# Patient Record
Sex: Male | Born: 1940 | Race: Black or African American | Hispanic: No | Marital: Single | Smoking: Never smoker
Health system: Southern US, Community
[De-identification: ages and names within clinical notes are randomized; demographics above are authoritative.]

## PROBLEM LIST (undated history)

## (undated) DIAGNOSIS — I10 Essential (primary) hypertension: Secondary | ICD-10-CM

## (undated) DIAGNOSIS — M542 Cervicalgia: Secondary | ICD-10-CM

## (undated) DIAGNOSIS — R0989 Other specified symptoms and signs involving the circulatory and respiratory systems: Secondary | ICD-10-CM

## (undated) DIAGNOSIS — N400 Enlarged prostate without lower urinary tract symptoms: Secondary | ICD-10-CM

## (undated) DIAGNOSIS — J309 Allergic rhinitis, unspecified: Secondary | ICD-10-CM

## (undated) DIAGNOSIS — E7800 Pure hypercholesterolemia, unspecified: Secondary | ICD-10-CM

## (undated) DIAGNOSIS — N189 Chronic kidney disease, unspecified: Secondary | ICD-10-CM

## (undated) HISTORY — DX: Chronic kidney disease, unspecified: N18.9

## (undated) HISTORY — DX: Cervicalgia: M54.2

## (undated) HISTORY — DX: Benign prostatic hyperplasia without lower urinary tract symptoms: N40.0

## (undated) HISTORY — DX: Allergic rhinitis, unspecified: J30.9

## (undated) HISTORY — DX: Other specified symptoms and signs involving the circulatory and respiratory systems: R09.89

---

## 1998-04-26 ENCOUNTER — Emergency Department (HOSPITAL_COMMUNITY): Admission: EM | Admit: 1998-04-26 | Discharge: 1998-04-26 | Payer: Self-pay | Admitting: Emergency Medicine

## 2004-01-31 ENCOUNTER — Ambulatory Visit (HOSPITAL_COMMUNITY): Admission: RE | Admit: 2004-01-31 | Discharge: 2004-01-31 | Payer: Self-pay | Admitting: Internal Medicine

## 2006-07-15 ENCOUNTER — Ambulatory Visit: Payer: Self-pay | Admitting: Family Medicine

## 2006-07-16 ENCOUNTER — Ambulatory Visit: Payer: Self-pay | Admitting: *Deleted

## 2007-06-17 ENCOUNTER — Encounter (INDEPENDENT_AMBULATORY_CARE_PROVIDER_SITE_OTHER): Payer: Self-pay | Admitting: *Deleted

## 2007-11-11 ENCOUNTER — Ambulatory Visit: Payer: Self-pay | Admitting: Internal Medicine

## 2007-12-02 ENCOUNTER — Encounter (INDEPENDENT_AMBULATORY_CARE_PROVIDER_SITE_OTHER): Payer: Self-pay | Admitting: Internal Medicine

## 2007-12-02 ENCOUNTER — Ambulatory Visit: Payer: Self-pay | Admitting: Family Medicine

## 2007-12-02 LAB — CONVERTED CEMR LAB
ALT: 16 units/L (ref 0–53)
AST: 16 units/L (ref 0–37)
Albumin: 4.5 g/dL (ref 3.5–5.2)
Alkaline Phosphatase: 109 units/L (ref 39–117)
BUN: 16 mg/dL (ref 6–23)
Basophils Relative: 2 % — ABNORMAL HIGH (ref 0–1)
Calcium: 10 mg/dL (ref 8.4–10.5)
Chloride: 102 meq/L (ref 96–112)
HDL: 52 mg/dL (ref >39)
LDL Cholesterol: 158 mg/dL — ABNORMAL HIGH (ref 0–99)
MCHC: 31.8 g/dL (ref 30.0–36.0)
Monocytes Relative: 9 % (ref 3–12)
Neutro Abs: 2.4 10*3/uL (ref 1.7–7.7)
Neutrophils Relative %: 42 % — ABNORMAL LOW (ref 43–77)
Platelets: 319 10*3/uL (ref 150–400)
Potassium: 4.2 meq/L (ref 3.5–5.3)
RBC: 5.14 M/uL (ref 4.22–5.81)
Sodium: 138 meq/L (ref 135–145)
TSH: 2.877 microintl units/mL (ref 0.350–5.50)
Total Protein: 8.1 g/dL (ref 6.0–8.3)
WBC: 5.8 10*3/uL (ref 4.0–10.5)

## 2007-12-11 ENCOUNTER — Ambulatory Visit: Payer: Self-pay | Admitting: Internal Medicine

## 2008-01-13 ENCOUNTER — Ambulatory Visit: Payer: Self-pay | Admitting: Internal Medicine

## 2008-03-21 ENCOUNTER — Emergency Department (HOSPITAL_COMMUNITY): Admission: EM | Admit: 2008-03-21 | Discharge: 2008-03-21 | Payer: Self-pay | Admitting: Emergency Medicine

## 2008-05-08 ENCOUNTER — Emergency Department (HOSPITAL_COMMUNITY): Admission: EM | Admit: 2008-05-08 | Discharge: 2008-05-08 | Payer: Self-pay | Admitting: Family Medicine

## 2008-05-29 ENCOUNTER — Emergency Department (HOSPITAL_COMMUNITY): Admission: EM | Admit: 2008-05-29 | Discharge: 2008-05-29 | Payer: Self-pay | Admitting: Emergency Medicine

## 2008-05-30 ENCOUNTER — Emergency Department (HOSPITAL_COMMUNITY): Admission: EM | Admit: 2008-05-30 | Discharge: 2008-05-30 | Payer: Self-pay | Admitting: Emergency Medicine

## 2008-07-20 ENCOUNTER — Ambulatory Visit: Payer: Self-pay | Admitting: Internal Medicine

## 2008-07-28 ENCOUNTER — Ambulatory Visit: Payer: Self-pay | Admitting: Internal Medicine

## 2008-07-28 LAB — CONVERTED CEMR LAB
ALT: 15 units/L (ref 0–53)
AST: 30 units/L (ref 0–37)
Albumin: 4.5 g/dL (ref 3.5–5.2)
CO2: 20 meq/L (ref 19–32)
Calcium: 10.2 mg/dL (ref 8.4–10.5)
Chloride: 100 meq/L (ref 96–112)
Cholesterol: 238 mg/dL — ABNORMAL HIGH (ref 0–200)
PSA: 1.83 ng/mL (ref 0.10–4.00)
Potassium: 5.2 meq/L (ref 3.5–5.3)
Sodium: 134 meq/L — ABNORMAL LOW (ref 135–145)
Total CHOL/HDL Ratio: 4.8
Total Protein: 8.2 g/dL (ref 6.0–8.3)

## 2008-08-10 ENCOUNTER — Ambulatory Visit: Payer: Self-pay | Admitting: Internal Medicine

## 2008-10-16 ENCOUNTER — Emergency Department (HOSPITAL_COMMUNITY): Admission: EM | Admit: 2008-10-16 | Discharge: 2008-10-16 | Payer: Self-pay | Admitting: Emergency Medicine

## 2008-11-09 ENCOUNTER — Ambulatory Visit (HOSPITAL_COMMUNITY): Admission: RE | Admit: 2008-11-09 | Discharge: 2008-11-10 | Payer: Self-pay | Admitting: Ophthalmology

## 2009-02-28 HISTORY — PX: COLONOSCOPY: SHX174

## 2009-03-13 ENCOUNTER — Ambulatory Visit: Payer: Self-pay | Admitting: Gastroenterology

## 2009-03-29 ENCOUNTER — Ambulatory Visit: Payer: Self-pay | Admitting: Gastroenterology

## 2009-03-29 ENCOUNTER — Encounter: Payer: Self-pay | Admitting: Gastroenterology

## 2009-03-31 ENCOUNTER — Encounter: Payer: Self-pay | Admitting: Gastroenterology

## 2009-04-10 ENCOUNTER — Ambulatory Visit (HOSPITAL_COMMUNITY): Admission: RE | Admit: 2009-04-10 | Discharge: 2009-04-10 | Payer: Self-pay | Admitting: Ophthalmology

## 2009-08-16 ENCOUNTER — Ambulatory Visit: Payer: Self-pay | Admitting: Family Medicine

## 2009-08-16 ENCOUNTER — Inpatient Hospital Stay (HOSPITAL_COMMUNITY): Admission: EM | Admit: 2009-08-16 | Discharge: 2009-08-18 | Payer: Self-pay | Admitting: Emergency Medicine
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
## 2009-11-13 ENCOUNTER — Emergency Department (HOSPITAL_COMMUNITY): Admission: EM | Admit: 2009-11-13 | Discharge: 2009-11-13 | Payer: Self-pay | Admitting: Family Medicine

## 2010-07-16 ENCOUNTER — Ambulatory Visit (HOSPITAL_COMMUNITY): Admission: RE | Admit: 2010-07-16 | Discharge: 2010-07-16 | Payer: Self-pay | Admitting: Ophthalmology

## 2010-10-21 ENCOUNTER — Encounter: Payer: Self-pay | Admitting: Internal Medicine

## 2010-10-30 ENCOUNTER — Other Ambulatory Visit: Payer: Self-pay | Admitting: Internal Medicine

## 2010-10-30 DIAGNOSIS — R635 Abnormal weight gain: Secondary | ICD-10-CM

## 2010-11-06 ENCOUNTER — Ambulatory Visit
Admission: RE | Admit: 2010-11-06 | Discharge: 2010-11-06 | Disposition: A | Discharge status: Home / Self Care | Payer: Self-pay | Source: Ambulatory Visit | Attending: Internal Medicine | Admitting: Internal Medicine

## 2010-11-06 DIAGNOSIS — R635 Abnormal weight gain: Secondary | ICD-10-CM

## 2010-12-12 LAB — BASIC METABOLIC PANEL
CO2: 27 mEq/L (ref 19–32)
Calcium: 10 mg/dL (ref 8.4–10.5)
Chloride: 101 mEq/L (ref 96–112)
Creatinine, Ser: 1.73 mg/dL — ABNORMAL HIGH (ref 0.4–1.5)
GFR calc Af Amer: 48 mL/min — ABNORMAL LOW (ref >60)
Sodium: 136 mEq/L (ref 135–145)

## 2010-12-12 LAB — CBC
Hemoglobin: 14.7 g/dL (ref 13.0–17.0)
MCH: 30.4 pg (ref 26.0–34.0)
MCV: 91.7 fL (ref 78.0–100.0)
Platelets: 290 10*3/uL (ref 150–400)
RBC: 4.83 MIL/uL (ref 4.22–5.81)
WBC: 5.7 10*3/uL (ref 4.0–10.5)

## 2010-12-12 LAB — SURGICAL PCR SCREEN: Staphylococcus aureus: NEGATIVE

## 2011-01-02 LAB — CBC
HCT: 43.5 % (ref 39.0–52.0)
HCT: 48.8 % (ref 39.0–52.0)
Hemoglobin: 16.7 g/dL (ref 13.0–17.0)
MCHC: 33.4 g/dL (ref 30.0–36.0)
MCHC: 34.2 g/dL (ref 30.0–36.0)
MCV: 93.1 fL (ref 78.0–100.0)
MCV: 94.4 fL (ref 78.0–100.0)
Platelets: 301 10*3/uL (ref 150–400)
Platelets: 302 10*3/uL (ref 150–400)
Platelets: 310 10*3/uL (ref 150–400)
RDW: 12.8 % (ref 11.5–15.5)
RDW: 13 % (ref 11.5–15.5)
WBC: 8.3 10*3/uL (ref 4.0–10.5)

## 2011-01-02 LAB — DIFFERENTIAL
Basophils Absolute: 0 10*3/uL (ref 0.0–0.1)
Basophils Absolute: 0 10*3/uL (ref 0.0–0.1)
Basophils Relative: 0 % (ref 0–1)
Lymphocytes Relative: 27 % (ref 12–46)
Monocytes Absolute: 0.2 10*3/uL (ref 0.1–1.0)
Monocytes Absolute: 0.7 10*3/uL (ref 0.1–1.0)
Monocytes Relative: 8 % (ref 3–12)
Neutro Abs: 4.9 10*3/uL (ref 1.7–7.7)
Neutro Abs: 7.8 10*3/uL — ABNORMAL HIGH (ref 1.7–7.7)
Neutrophils Relative %: 60 % (ref 43–77)
Neutrophils Relative %: 88 % — ABNORMAL HIGH (ref 43–77)

## 2011-01-02 LAB — COMPREHENSIVE METABOLIC PANEL
ALT: 16 U/L (ref 0–53)
AST: 16 U/L (ref 0–37)
AST: 17 U/L (ref 0–37)
Albumin: 3.3 g/dL — ABNORMAL LOW (ref 3.5–5.2)
Albumin: 3.7 g/dL (ref 3.5–5.2)
Alkaline Phosphatase: 103 U/L (ref 39–117)
Alkaline Phosphatase: 121 U/L — ABNORMAL HIGH (ref 39–117)
BUN: 12 mg/dL (ref 6–23)
BUN: 17 mg/dL (ref 6–23)
Calcium: 10.3 mg/dL (ref 8.4–10.5)
Calcium: 8.7 mg/dL (ref 8.4–10.5)
Chloride: 102 mEq/L (ref 96–112)
Creatinine, Ser: 1.45 mg/dL (ref 0.4–1.5)
Creatinine, Ser: 1.57 mg/dL — ABNORMAL HIGH (ref 0.4–1.5)
GFR calc Af Amer: 59 mL/min — ABNORMAL LOW (ref >60)
GFR calc Af Amer: 59 mL/min — ABNORMAL LOW (ref >60)
Glucose, Bld: 159 mg/dL — ABNORMAL HIGH (ref 70–99)
Potassium: 3.7 mEq/L (ref 3.5–5.1)
Potassium: 3.8 mEq/L (ref 3.5–5.1)
Sodium: 135 mEq/L (ref 135–145)
Total Bilirubin: 1 mg/dL (ref 0.3–1.2)
Total Protein: 8.8 g/dL — ABNORMAL HIGH (ref 6.0–8.3)

## 2011-01-02 LAB — PROTIME-INR
INR: 0.95 (ref 0.00–1.49)
Prothrombin Time: 12.6 seconds (ref 11.6–15.2)

## 2011-01-02 LAB — URINALYSIS, ROUTINE W REFLEX MICROSCOPIC
Hgb urine dipstick: NEGATIVE
Leukocytes, UA: NEGATIVE
Protein, ur: 30 mg/dL — AB
Specific Gravity, Urine: 1.021 (ref 1.005–1.030)
Urobilinogen, UA: 1 mg/dL (ref 0.0–1.0)

## 2011-01-02 LAB — RAPID URINE DRUG SCREEN, HOSP PERFORMED
Barbiturates: NOT DETECTED
Cocaine: POSITIVE — AB
Opiates: POSITIVE — AB

## 2011-01-02 LAB — URINE CULTURE
Colony Count: NO GROWTH
Culture: NO GROWTH

## 2011-01-02 LAB — HEMOCCULT GUIAC POC 1CARD (OFFICE): Fecal Occult Bld: POSITIVE

## 2011-01-02 LAB — POCT CARDIAC MARKERS: Myoglobin, poc: 64.8 ng/mL (ref 12–200)

## 2011-01-02 LAB — URINE MICROSCOPIC-ADD ON

## 2011-01-06 LAB — CBC
HCT: 47.9 % (ref 39.0–52.0)
Hemoglobin: 16.1 g/dL (ref 13.0–17.0)
RBC: 5.03 MIL/uL (ref 4.22–5.81)
RDW: 13.1 % (ref 11.5–15.5)

## 2011-01-06 LAB — BASIC METABOLIC PANEL
CO2: 27 mEq/L (ref 19–32)
GFR calc Af Amer: >60 mL/min (ref >60)
GFR calc non Af Amer: 50 mL/min — ABNORMAL LOW (ref >60)
Glucose, Bld: 121 mg/dL — ABNORMAL HIGH (ref 70–99)
Potassium: 4.1 mEq/L (ref 3.5–5.1)
Sodium: 139 mEq/L (ref 135–145)

## 2011-01-15 LAB — CBC
HCT: 45.1 % (ref 39.0–52.0)
Hemoglobin: 15.4 g/dL (ref 13.0–17.0)
MCHC: 34.1 g/dL (ref 30.0–36.0)
MCV: 93.3 fL (ref 78.0–100.0)
Platelets: 278 10*3/uL (ref 150–400)
RDW: 13 % (ref 11.5–15.5)

## 2011-01-15 LAB — BASIC METABOLIC PANEL
BUN: 15 mg/dL (ref 6–23)
CO2: 26 mEq/L (ref 19–32)
GFR calc non Af Amer: 57 mL/min — ABNORMAL LOW (ref >60)
Glucose, Bld: 116 mg/dL — ABNORMAL HIGH (ref 70–99)
Potassium: 4.2 mEq/L (ref 3.5–5.1)
Sodium: 137 mEq/L (ref 135–145)
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
## 2011-02-12 NOTE — Op Note (Signed)
Douglas Lowery, Douglas Lowery               ACCOUNT NO.:  [id number]   MEDICAL RECORD NO.:  [id number]          PATIENT TYPE:  AMB   LOCATION:  SDS                          FACILITY:  [facility]   PHYSICIAN:  Alford Highland. Rankin, M.D.   DATE OF BIRTH:  27-Jul-1941   DATE OF PROCEDURE:  04/10/2009  DATE OF DISCHARGE:  04/10/2009                               OPERATIVE REPORT   PREOPERATIVE DIAGNOSIS:  Aphakia left eye status post traumatic cataract  dislocated natural lens into the vitreous cavity and primary removal.   POSTOPERATIVE DIAGNOSIS:  Aphakia left eye status post traumatic  cataract dislocated natural lens into the vitreous cavity and primary  removal.   PROCEDURE:  Posterior vitrectomy - 25-gauge left eye with scleral flap  sulcus base suture fixation of posterior chamber intraocular lens -  secondary stall Alcon CZ70BD power +24.5, 2-0 Prolene was placed under  corneal based limbal scleral flaps at the 2 and 8 o'clock positions in  the left eye.   SURGEON:  Alford Highland. Rankin, MD   ANESTHESIA:  General endotracheal anesthesia.   INDICATIONS FOR PROCEDURE:  The patient is a 70 year old man who has a  profound vision loss on left eye on the basis of aphakia after traumatic  cataract from blunt trauma secondary glaucoma as well.  There was an  attempt to avoid the anterior chamber angle by placement of anterior  chamber lens to restore the pseudophakia status by suturing posterior  chamber intraocular lens into the sulcus.  The patient understands the  risks of anesthesia including recurrence, death, loss of the eye  including but not limited to hemorrhage, infection, scarring, need for  another surgery, no change in vision, loss of vision, progressive  disease despite intervention.  Appropriate signed consent was obtained.  The patient was taken to the operating room.   In the operating room appropriate monitors followed by mild sedation..  General endotracheal anesthesia was instituted  without difficulty.  The  left periocular region was sterilely prepped and draped in the usual  opthalmic fashion.  Lid speculum applied.  A 25-gauge trocar placed in  inferotemporal quadrant.  The infusion turned on.  Groove limbal  incision was then made superiorly after conjunctival peritomy was then  fashioned.  Starting from clear cornea and heading towards blue cornea  and the white limbus  crescent blade knife was then used to engage and  create a scleral flap which was fornix-based at the 2 and 8'clock  positions.  This was carried down without difficulty or trauma.  The  overlying conjunctiva was left intact.   At this time, the anterior chamber was then opened and deepened with  Provisc.  It was opened through the limbal incision.  At this time, the  CZ70BD was then opened and then a 10-0 Prolene suture was tied to the  eyelets in such a fashion as to prevent torque.  At this time, the  limbal wound was then opened completely and under low infusion pressures  the 10-0 Prolene sutures then passed behind the iris root and  posteriorly and were then passed in a  trans-scleral technique underneath  the scleral flap.  These were also passed transconjunctival the needles  were.  Subsequently, the traversing portions of the 10-0 Prolene  traversing the scleral flap was then looped with a Sinskey hook and  retrieved.  Second needle was passed in such a fashion as well.  A  second 10-0 Prolene was then secured to the superior haptic in similar  fashion and this was also placed in the translimbal behind the iris root  fashioned to the 2 o'clock position and passed underneath the scleral  flap transconjunctivally and the 10-0 Prolenes were looped from beneath  the scleral flap and then at this time the lens was then delivered  across the iris into the posterior chamber into the sulcus and then was  secured in such a way to be centered nicely.  This was done without  difficulty.  No obvious  torque was noted.  At this time, the 10-0  Prolenes were then secured, appropriately tightened and tied off.  It is  to be noted that prior to placement of the lens the anterior chamber had  remained deepened down from the beginning with Viscoat and was remained  so.  Thereafter, vitrectomy was then carried out to remove some of the  viscoelastic.  Prior to the vitrectomy portion of the procedure, the  limbal wound had been closed with 10-0 nylon sutures superiorly and the  knots rotated to bury.  Conjunctiva was then brought forward and closed  with 7-0 Vicryl.  Superior trocars were applied and second look  vitrectomy was carried out.  No complications occurred.  The infusion  pressures were normal.  The wounds were normal.  At this time, the  trocars were removed so was the infusion.  Subconjunctival Decadron  applied.  Sterile patch and [facility] were applied.  The patient  tolerated the procedure without complication.      Alford Highland Rankin, M.D.  Electronically Signed     GAR/MEDQ  D:  05/09/2009  T:  05/10/2009  Job:  [id number]

## 2011-02-15 NOTE — Op Note (Signed)
Douglas Lowery, Douglas Lowery               ACCOUNT NO.:  [id number]   MEDICAL RECORD NO.:  [id number]          PATIENT TYPE:  AMB   LOCATION:  SDS                          FACILITY:  [facility]   PHYSICIAN:  Alford Highland. Rankin, M.D.   DATE OF BIRTH:  05/17/1941   DATE OF PROCEDURE:  04/17/2009  DATE OF DISCHARGE:  04/10/2009                               OPERATIVE REPORT   PREOPERATIVE DIAGNOSES:  1. Aphakia, left eye.  2. Complex post-traumatic angle resection glaucoma, left eye, status      post prior glaucoma seton.  3. History of previous vitrectomy for dense vitreous hemorrhage from      the trauma.   POSTOPERATIVE DIAGNOSES:  1. Aphakia, left eye.  2. Complex post-traumatic angle resection glaucoma, left eye, status      post prior glaucoma seton.  3. History of previous vitrectomy for dense vitreous hemorrhage from      the trauma.   PROCEDURE:  1. Posterior vitrectomy - 25 gauge, left eye.  2. Complex surgical insertion and suture suspension of posterior      chamber intraocular lens and the sulcus with scleral flaps      dissection with secondary insertion of PCIL.  The lens is an Alcon      CZ 7-0BD; diopter is +24.5.  The suture orientation with 10-0      Prolene so is at the 8 o'clock and the 2 o'clock position in the      left eye.   SURGEON:  Alford Highland. Rankin, MD.   ANESTHESIA:  General endotracheal anesthesia.   INDICATION FOR PROCEDURE:  The patient is a 70 year old man with  profound vision loss on basis of aphakia stabilized glaucoma and  previous vitreous hemorrhage with previous vitrectomy, insertion of  glaucoma seton to control the intraocular pressure.  This is an attempt  to restore pseudophakia so as to allow visual function in his left eye.  The patient understands the risks of anesthesia including the  recurrence, loss of the eye including, but not limited to hemorrhage,  infection, scarring, need for surgery, change in vision, loss of vision,  or progressive  disease, despite intervention.  Proper signed consent was  obtained.  The patient was taken to the operating room.  In the  operating room, appropriate monitors were followed by mild sedation.  General endotracheal anesthesia was instilled without difficulty.  The  right periocular region was identified.  Sterilely prepped and draped in  the usual ophthalmic fashion.  Lid speculum was applied.  A 25-gauge  trocar was placed in the inferotemporal quadrant.  The infusion was then  placed and turned on.  Scleral dissection was then carried out starting  from the clear blue corneal limbal region, tangential line beneath the  conjunctiva and as to a split scleral thickness.  Flap at the 2 o'clock  and also at the 8 o'clock position.   At this time, groove limbal incision was then fashioned in superior  limbus.  An biplanar incision made to open the anterior chamber.  Anterior chamber was deepened with viscoelastic.  Vitrectomy was then  carried out confirming that there was no peripheral vitreous.  At this  time, the limbal wound superiorly was enlarged.  Prior to completion of  its opening, the infusion was entered low, the 10-0 Prolene was then  used to secure the leading haptic with multiple knots and loops.  No  cutting of this was undertaken.  At this time, a 10-0 Prolene curved  needle was then used in a translimbal, trans pupil, and held through the  sulcus and underneath the scleral flap.  This was passed in a  transconjunctival fashion.  A second suture was placed in similar  fashion.  Each of the 10-0 Prolene was then looped from the mid section  of the half-thickness scleral flap and the needles were cut and taken to  the post to the bag tray.   Thereafter, the superior Prolene was attached to the superior haptic and  this was also placed in the similar fashion.  Translimbal, trans pupil,  transscleral flap to the 2 o'clock position.  The intraocular lens under  low infusion  pressures was then delivered into the posterior chamber via  the pupil.  Excellent centration was obtained.  The knots of the 10-0  Prolene beneath scleral flaps were then secured.  Excess centration and  stability was obtained.  No rotation was obvious.  At this time, the  conjunctival scleral flap was then closed with a solitary 7-0 vertical  mattress Prolene.  Prior to closure of this flap, however, limbal wound  superiorly was closed with an interrupted 10-0 nylons.  Viscoelastic was  aspirated from the anterior chamber.  A second look vitrectomy was  carried out confirming the retinas attached.  No complications occurred.  The vitrectomy trocars and the infusion were removed.  Subconjunctival  Decadron applied.  Conjunctiva was then brought forward and closed with  7-0 Vicryl.  Sterile patch and Fox shield applied.  The patient was  awakened from anesthesia without difficulty, taken to recovery room.    It must be noted and please note that this was an unusually difficult  case because of no support for the suture posterior chamber intraocular  lens.      Alford Highland Rankin, M.D.  Electronically Signed     GAR/MEDQ  D:  04/17/2009  T:  04/18/2009  Job:  [id number]

## 2011-06-27 LAB — URINALYSIS, ROUTINE W REFLEX MICROSCOPIC
Bilirubin Urine: NEGATIVE
Ketones, ur: NEGATIVE
Nitrite: NEGATIVE
Protein, ur: NEGATIVE
pH: 6

## 2011-06-27 LAB — POCT I-STAT, CHEM 8
Calcium, Ion: 1.09 — ABNORMAL LOW
Glucose, Bld: 100 — ABNORMAL HIGH
HCT: 50
Hemoglobin: 17
TCO2: 25

## 2011-10-01 HISTORY — PX: EYE SURGERY: SHX253

## 2012-04-17 ENCOUNTER — Emergency Department (HOSPITAL_COMMUNITY)
Admission: EM | Admit: 2012-04-17 | Discharge: 2012-04-17 | Disposition: A | Discharge status: Home / Self Care | Payer: Medicare Other | Attending: Emergency Medicine | Admitting: Emergency Medicine

## 2012-04-17 ENCOUNTER — Emergency Department (HOSPITAL_COMMUNITY): Payer: Medicare Other

## 2012-04-17 ENCOUNTER — Encounter (HOSPITAL_COMMUNITY): Payer: Self-pay | Admitting: *Deleted

## 2012-04-17 DIAGNOSIS — E7800 Pure hypercholesterolemia, unspecified: Secondary | ICD-10-CM | POA: Insufficient documentation

## 2012-04-17 DIAGNOSIS — I10 Essential (primary) hypertension: Secondary | ICD-10-CM | POA: Insufficient documentation

## 2012-04-17 DIAGNOSIS — M545 Low back pain, unspecified: Secondary | ICD-10-CM | POA: Insufficient documentation

## 2012-04-17 DIAGNOSIS — M549 Dorsalgia, unspecified: Secondary | ICD-10-CM

## 2012-04-17 HISTORY — DX: Pure hypercholesterolemia, unspecified: E78.00

## 2012-04-17 HISTORY — DX: Essential (primary) hypertension: I10

## 2012-04-17 LAB — POCT I-STAT, CHEM 8
BUN: 18 mg/dL (ref 6–23)
Calcium, Ion: 1.22 mmol/L (ref 1.13–1.30)
Hemoglobin: 15.6 g/dL (ref 13.0–17.0)
TCO2: 22 mmol/L (ref 0–100)

## 2012-04-17 MED ORDER — PREDNISONE 20 MG PO TABS: 60.0000 mg | ORAL_TABLET | Freq: Every day | ORAL | Status: AC

## 2012-04-17 MED ORDER — CYCLOBENZAPRINE HCL 10 MG PO TABS: 10.0000 mg | ORAL_TABLET | Freq: Two times a day (BID) | ORAL | Status: AC | PRN

## 2012-04-17 MED ORDER — HYDROCODONE-ACETAMINOPHEN 5-325 MG PO TABS
1.0000 | ORAL_TABLET | Freq: Once | ORAL | Status: AC
  Administered 2012-04-17: 1 via ORAL
  Filled 2012-04-17 (×2): qty 1

## 2012-04-17 MED ORDER — HYDROCODONE-ACETAMINOPHEN 5-500 MG PO TABS: 1.0000 | ORAL_TABLET | Freq: Four times a day (QID) | ORAL | Status: AC | PRN

## 2012-04-17 NOTE — ED Provider Notes (Signed)
History     CSN: [id number]  Arrival date & time 04/17/12  0409   First MD Initiated Contact with Patient 04/17/12 [phone]      Chief Complaint  Patient presents with  . Back Pain    (Consider location/radiation/quality/duration/timing/severity/associated sxs/prior treatment) HPI History provided by patient. In the shower 2 days ago bent down to get soap and felt a pop in his back. Since then is having right lower back pain. Sharp in quality. No radiation of pain. No associated weakness or numbness. No incontinence of bowel or bladder. No history of back problems. Is able to walk but hurts to move. Has not taken anything for this at home. No history of same. No fevers or chills. His not diabetic. Past Medical History  Diagnosis Date  . Hypertension   . Sinus drainage   . Hypercholesteremia     History reviewed. No pertinent past surgical history.  History reviewed. No pertinent family history.  History  Substance Use Topics  . Smoking status: Never Smoker   . Smokeless tobacco: Not on file  . Alcohol Use: No      Review of Systems  Constitutional: Negative for fever and chills.  HENT: Negative for neck pain and neck stiffness.   Eyes: Negative for pain.  Respiratory: Negative for shortness of breath.   Cardiovascular: Negative for chest pain.  Gastrointestinal: Negative for abdominal pain.  Genitourinary: Negative for dysuria.  Musculoskeletal: Positive for back pain.  Skin: Negative for rash.  Neurological: Negative for headaches.  All other systems reviewed and are negative.    Allergies  Review of patient's allergies indicates no known allergies.  Home Medications   Current Outpatient Rx  Name Route Sig Dispense Refill  . ASPIRIN 81 MG PO CHEW Oral Chew 81 mg by mouth daily.    . FENOFIBRATE 54 MG PO TABS Oral Take 54 mg by mouth daily.    Marland Kitchen PRAVASTATIN SODIUM 40 MG PO TABS Oral Take 40 mg by mouth daily.    Marland Kitchen PRESCRIPTION MEDICATION      . SILODOSIN 8  MG PO CAPS Oral Take 8 mg by mouth daily with breakfast.      BP 183/106  Pulse 96  Temp 98.5 F (36.9 C) (Oral)  SpO2 98%  Physical Exam  Constitutional: He is oriented to person, place, and time. He appears well-developed and well-nourished.  HENT:  Head: Normocephalic and atraumatic.  Eyes: Conjunctivae and EOM are normal. Pupils are equal, round, and reactive to light.  Neck: Trachea normal. Neck supple. No thyromegaly present.  Cardiovascular: Normal rate, regular rhythm, S1 normal, S2 normal and normal pulses.     No systolic murmur is present   No diastolic murmur is present  Pulses:      Radial pulses are 2+ on the right side, and 2+ on the left side.  Pulmonary/Chest: Effort normal and breath sounds normal. He has no wheezes. He has no rhonchi. He has no rales. He exhibits no tenderness.  Abdominal: Soft. Normal appearance and bowel sounds are normal. There is no tenderness. There is no CVA tenderness and negative Murphy's sign.  Musculoskeletal:       Right paralumbar tenderness without midline tenderness or deformity. No swelling, rash or lesion. No lower extremity deficits with BLE:s dorsi plantar like shin equal and intact. Calves nontender, no cords or erythema, negative Homans sign  Neurological: He is alert and oriented to person, place, and time. He has normal strength. No cranial nerve deficit or sensory  deficit. GCS eye subscore is 4. GCS verbal subscore is 5. GCS motor subscore is 6.  Skin: Skin is warm and dry. No rash noted. He is not diaphoretic.  Psychiatric: His speech is normal.       Cooperative and appropriate    ED Course  Procedures (including critical care time)  Labs Reviewed  POCT I-STAT, CHEM 8 - Abnormal; Notable for the following:    Creatinine, Ser 1.40 (*)     Glucose, Bld 118 (*)     All other components within normal limits  URINALYSIS, ROUTINE W REFLEX MICROSCOPIC   Dg Lumbar Spine Complete  04/17/2012  *RADIOLOGY REPORT*  Clinical  Data: Back pain since Wednesday.  Popping sensation while bending down to fill in bathtub.  LUMBAR SPINE - COMPLETE 4+ VIEW  Comparison: Abdomen 08/18/2009.  CT abdomen and pelvis 08/16/2009.  Findings: Five lumbar type vertebrae.  Normal alignment of lumbar vertebrae and facet joints.  Degenerative changes throughout the lumbar spine with narrowed lumbar interspaces and associated hypertrophic changes.  Degenerative changes in the facet joints. No vertebral compression deformities.  No focal bone lesion or bone destruction.  Bone cortex and trabecular architecture appear intact.  IMPRESSION: Degenerative changes in the lumbar spine.  No displaced fractures identified.  Original Report Authenticated By: Marlon Pel, M.D.   Pain medications provided.   MDM   Low-back pain after bending injury.  Imaging and labs obtained and reviewed as above. Condition improved with pain medications. Nursing notes reviewed. Vital signs reviewed. Old records reviewed. No deficits or red flags to indicate need for emergent MRI at this time. Plan discharge home with outpatient followup. Prescriptions provided.        Sunnie Nielsen, MD 04/17/12 [phone]

## 2012-04-17 NOTE — ED Notes (Addendum)
Patient with back pain increasing since Wednesday.  Patient states that it is his lower back.

## 2012-04-17 NOTE — ED Notes (Signed)
Pt states he has not taken his "blood pressure meds" this morning

## 2012-04-17 NOTE — ED Notes (Signed)
Pt c/o lower back pain since Wednesday, was in shower and heard a "pop".  Hurts to walk, hard to stand from sitting.

## 2012-05-13 ENCOUNTER — Ambulatory Visit: Payer: PRIVATE HEALTH INSURANCE | Attending: Internal Medicine

## 2012-05-13 ENCOUNTER — Ambulatory Visit: Payer: PRIVATE HEALTH INSURANCE | Admitting: Rehabilitative and Restorative Service Providers"

## 2012-05-13 DIAGNOSIS — R293 Abnormal posture: Secondary | ICD-10-CM | POA: Insufficient documentation

## 2012-05-13 DIAGNOSIS — M6281 Muscle weakness (generalized): Secondary | ICD-10-CM | POA: Insufficient documentation

## 2012-05-13 DIAGNOSIS — IMO0001 Reserved for inherently not codable concepts without codable children: Secondary | ICD-10-CM | POA: Insufficient documentation

## 2012-05-22 ENCOUNTER — Ambulatory Visit: Payer: PRIVATE HEALTH INSURANCE

## 2012-05-26 ENCOUNTER — Ambulatory Visit: Payer: PRIVATE HEALTH INSURANCE | Admitting: Rehabilitative and Restorative Service Providers"

## 2012-06-03 ENCOUNTER — Ambulatory Visit
Payer: PRIVATE HEALTH INSURANCE | Attending: Internal Medicine | Admitting: Rehabilitative and Restorative Service Providers"

## 2012-06-03 DIAGNOSIS — R293 Abnormal posture: Secondary | ICD-10-CM | POA: Insufficient documentation

## 2012-06-03 DIAGNOSIS — IMO0001 Reserved for inherently not codable concepts without codable children: Secondary | ICD-10-CM | POA: Insufficient documentation

## 2012-06-03 DIAGNOSIS — M6281 Muscle weakness (generalized): Secondary | ICD-10-CM | POA: Insufficient documentation

## 2012-06-11 ENCOUNTER — Ambulatory Visit: Payer: PRIVATE HEALTH INSURANCE | Admitting: Rehabilitative and Restorative Service Providers"

## 2012-12-21 ENCOUNTER — Ambulatory Visit (INDEPENDENT_AMBULATORY_CARE_PROVIDER_SITE_OTHER): Payer: Medicare Other | Admitting: Nurse Practitioner

## 2012-12-21 ENCOUNTER — Encounter: Payer: Self-pay | Admitting: Nurse Practitioner

## 2012-12-21 VITALS — BP 144/86 | HR 64 | Temp 97.4°F | Resp 16 | Ht 67.0 in | Wt 183.8 lb

## 2012-12-21 DIAGNOSIS — N189 Chronic kidney disease, unspecified: Secondary | ICD-10-CM

## 2012-12-21 DIAGNOSIS — I10 Essential (primary) hypertension: Secondary | ICD-10-CM | POA: Insufficient documentation

## 2012-12-21 DIAGNOSIS — J301 Allergic rhinitis due to pollen: Secondary | ICD-10-CM

## 2012-12-21 DIAGNOSIS — H6122 Impacted cerumen, left ear: Secondary | ICD-10-CM

## 2012-12-21 DIAGNOSIS — N400 Enlarged prostate without lower urinary tract symptoms: Secondary | ICD-10-CM | POA: Insufficient documentation

## 2012-12-21 DIAGNOSIS — Z0000 Encounter for general adult medical examination without abnormal findings: Secondary | ICD-10-CM

## 2012-12-21 DIAGNOSIS — E7800 Pure hypercholesterolemia, unspecified: Secondary | ICD-10-CM | POA: Insufficient documentation

## 2012-12-21 DIAGNOSIS — H6120 Impacted cerumen, unspecified ear: Secondary | ICD-10-CM

## 2012-12-21 DIAGNOSIS — T7840XA Allergy, unspecified, initial encounter: Secondary | ICD-10-CM | POA: Insufficient documentation

## 2012-12-21 MED ORDER — SILODOSIN 8 MG PO CAPS: 8.0000 mg | ORAL_CAPSULE | Freq: Every day | ORAL | Status: DC

## 2012-12-21 MED ORDER — HYDRALAZINE HCL 50 MG PO TABS: 50.0000 mg | ORAL_TABLET | Freq: Three times a day (TID) | ORAL | Status: DC

## 2012-12-21 MED ORDER — NEBIVOLOL HCL 10 MG PO TABS: 10.0000 mg | ORAL_TABLET | Freq: Every day | ORAL | Status: DC

## 2012-12-21 MED ORDER — PRAVASTATIN SODIUM 40 MG PO TABS: 40.0000 mg | ORAL_TABLET | Freq: Every day | ORAL | Status: DC

## 2012-12-21 MED ORDER — FENOFIBRATE 54 MG PO TABS: 54.0000 mg | ORAL_TABLET | Freq: Every day | ORAL | Status: DC

## 2012-12-21 NOTE — Progress Notes (Signed)
  Subjective:    Patient ID: Douglas Lowery, male    DOB: [birth year], 72 y.o.   MRN: [id number]  HPI  72 year old male here for EV. Currently without any new complaints at this visit. Reports he is doing well.    Review of Systems  Constitutional: Negative for fever, chills, activity change, appetite change and fatigue.  HENT: Positive for congestion (sinsus congestion comes and go- see ENT occasionally ) and rhinorrhea. Negative for ear pain, sinus pressure and ear discharge.   Eyes: Negative for pain and visual disturbance.  Respiratory: Negative for cough, chest tightness, shortness of breath and wheezing.   Cardiovascular: Negative.  Negative for chest pain.  Gastrointestinal: Negative for nausea, vomiting, abdominal pain and abdominal distention.  Genitourinary: Negative.   Musculoskeletal: Negative.   Skin: Negative.   Allergic/Immunologic: Positive for environmental allergies. Negative for immunocompromised state.  Neurological: Negative.   Hematological: Negative.   Psychiatric/Behavioral: Negative.        Objective:   Physical Exam  Constitutional: He is oriented to person, place, and time. He appears well-developed and well-nourished. No distress.  HENT:  Head: Normocephalic and atraumatic.  Nose: Nose normal.  Mouth/Throat: Oropharynx is clear and moist.  Increased cerumen in left ear   Eyes: Conjunctivae and EOM are normal. Pupils are equal, round, and reactive to light.  Neck: Normal range of motion. Neck supple. No JVD present. No tracheal deviation present. No thyromegaly present.  Cardiovascular: Normal rate, regular rhythm, normal heart sounds and intact distal pulses.   Pulmonary/Chest: Effort normal and breath sounds normal.  Abdominal: Soft. Bowel sounds are normal.  Genitourinary: Rectum normal, prostate normal and penis normal.  Musculoskeletal: Normal range of motion.  Lymphadenopathy:    He has no cervical adenopathy.  Neurological: He is alert and  oriented to person, place, and time.  Skin: Skin is warm and dry. He is not diaphoretic.  Dry skin   Psychiatric: He has a normal mood and affect. His behavior is normal. Thought content normal.          Assessment & Plan:  Unspecified essential hypertension - needs refills- stable on currently medications without side effects will cont current medications  Allergy- stable on medications and nasal saline spray helps PRN Hypercholesteremia- cont current medications Chronic kidney disease- reports increased in hydration no NSAID use. Will recheck lab today  BPH- stable on rapaflo- without any complaints   Cerumen impaction- will get staff to do ear wash on L ear  Will have staff give flu shot

## 2012-12-21 NOTE — Patient Instructions (Signed)
Hypertension As your heart beats, it forces blood through your arteries. This force is your blood pressure. If the pressure is too high, it is called hypertension (HTN) or high blood pressure. HTN is dangerous because you may have it and not know it. High blood pressure may mean that your heart has to work harder to pump blood. Your arteries may be narrow or stiff. The extra work puts you at risk for heart disease, stroke, and other problems.  Blood pressure consists of two numbers, a higher number over a lower, 110/72, for example. It is stated as "110 over 72." The ideal is below 120 for the top number (systolic) and under 80 for the bottom (diastolic). Write down your blood pressure today. You should pay close attention to your blood pressure if you have certain conditions such as:  Heart failure.  Prior heart attack.  Diabetes  Chronic kidney disease.  Prior stroke.  Multiple risk factors for heart disease. To see if you have HTN, your blood pressure should be measured while you are seated with your arm held at the level of the heart. It should be measured at least twice. A one-time elevated blood pressure reading (especially in the Emergency Department) does not mean that you need treatment. There may be conditions in which the blood pressure is different between your right and left arms. It is important to see your caregiver soon for a recheck. Most people have essential hypertension which means that there is not a specific cause. This type of high blood pressure may be lowered by changing lifestyle factors such as:  Stress.  Smoking.  Lack of exercise.  Excessive weight.  Drug/tobacco/alcohol use.  Eating less salt. Most people do not have symptoms from high blood pressure until it has caused damage to the body. Effective treatment can often prevent, delay or reduce that damage. TREATMENT  When a cause has been identified, treatment for high blood pressure is directed at the  cause. There are a large number of medications to treat HTN. These fall into several categories, and your caregiver will help you select the medicines that are best for you. Medications may have side effects. You should review side effects with your caregiver. If your blood pressure stays high after you have made lifestyle changes or started on medicines,   Your medication(s) may need to be changed.  Other problems may need to be addressed.  Be certain you understand your prescriptions, and know how and when to take your medicine.  Be sure to follow up with your caregiver within the time frame advised (usually within two weeks) to have your blood pressure rechecked and to review your medications.  If you are taking more than one medicine to lower your blood pressure, make sure you know how and at what times they should be taken. Taking two medicines at the same time can result in blood pressure that is too low. SEEK IMMEDIATE MEDICAL CARE IF:  You develop a severe headache, blurred or changing vision, or confusion.  You have unusual weakness or numbness, or a faint feeling.  You have severe chest or abdominal pain, vomiting, or breathing problems. MAKE SURE YOU:   Understand these instructions.  Will watch your condition.  Will get help right away if you are not doing well or get worse. Document Released: 09/16/2005 Document Revised: 12/09/2011 Document Reviewed: 05/06/2008 [employer] Patient Information 2013 [employer]. Hypercholesterolemia High Blood Cholesterol Cholesterol is a white, waxy, fat-like protein needed by your body in   small amounts. The liver makes all the cholesterol you need. It is carried from the liver by the blood through the blood vessels. Deposits (plaque) may build up on blood vessel walls. This makes the arteries narrower and stiffer. Plaque increases the risk for heart attack and stroke. You cannot feel your cholesterol level even if it is very high. The  only way to know is by a blood test to check your lipid (fats) levels. Once you know your cholesterol levels, you should keep a record of the test results. Work with your caregiver to to keep your levels in the desired range. WHAT THE RESULTS MEAN:  Total cholesterol is a rough measure of all the cholesterol in your blood.  LDL is the so-called bad cholesterol. This is the type that deposits cholesterol in the walls of the arteries. You want this level to be low.  HDL is the good cholesterol because it cleans the arteries and carries the LDL away. You want this level to be high.  Triglycerides are fat that the body can either burn for energy or store. High levels are closely linked to heart disease. DESIRED LEVELS:  Total cholesterol below 200.  LDL below 100 for people at risk, below 70 for very high risk.  HDL above 50 is good, above 60 is best.  Triglycerides below 150. HOW TO LOWER YOUR CHOLESTEROL:  Diet.  Choose fish or white meat chicken and turkey, roasted or baked. Limit fatty cuts of red meat, fried foods, and processed meats, such as sausage and lunch meat.  Eat lots of fresh fruits and vegetables. Choose whole grains, beans, pasta, potatoes and cereals.  Use only small amounts of olive, corn or canola oils. Avoid butter, mayonnaise, shortening or palm kernel oils. Avoid foods with trans-fats.  Use skim/nonfat milk and low-fat/nonfat yogurt and cheeses. Avoid whole milk, cream, ice cream, egg yolks and cheeses. Healthy desserts include angel food cake, gingersnaps, animal crackers, hard candy, popsicles, and low-fat/nonfat frozen yogurt. Avoid pastries, cakes, pies and cookies.  Exercise.  A regular program helps decrease LDL and raises HDL.  Helps with weight control.  Do things that increase your activity level like gardening, walking, or taking the stairs.  Medication.  May be prescribed by your caregiver to help lowering cholesterol and the risk for heart  disease.  You may need medicine even if your levels are normal if you have several risk factors. HOME CARE INSTRUCTIONS   Follow your diet and exercise programs as suggested by your caregiver.  Take medications as directed.  Have blood work done when your caregiver feels it is necessary. MAKE SURE YOU:   Understand these instructions.  Will watch your condition.  Will get help right away if you are not doing well or get worse. Document Released: 09/16/2005 Document Revised: 12/09/2011 Document Reviewed: 03/04/2007 [employer] Patient Information 2013 [employer].  

## 2012-12-22 ENCOUNTER — Encounter: Payer: Self-pay | Admitting: Nurse Practitioner

## 2012-12-23 LAB — BASIC METABOLIC PANEL
BUN/Creatinine Ratio: 12 (ref 10–22)
Chloride: 103 mmol/L (ref 97–108)
GFR calc Af Amer: 63 mL/min/{1.73_m2} (ref >59)
Glucose: 111 mg/dL — ABNORMAL HIGH (ref 65–99)
Potassium: 3.9 mmol/L (ref 3.5–5.2)

## 2012-12-23 LAB — PSA: PSA: 1.9 ng/mL (ref 0.0–4.0)

## 2013-06-01 ENCOUNTER — Other Ambulatory Visit: Payer: Self-pay

## 2013-06-01 DIAGNOSIS — I10 Essential (primary) hypertension: Secondary | ICD-10-CM

## 2013-06-01 MED ORDER — NEBIVOLOL HCL 10 MG PO TABS: 10.0000 mg | ORAL_TABLET | Freq: Every day | ORAL | Status: DC

## 2013-06-01 MED ORDER — HYDRALAZINE HCL 50 MG PO TABS: 50.0000 mg | ORAL_TABLET | Freq: Three times a day (TID) | ORAL | Status: DC

## 2013-06-24 ENCOUNTER — Ambulatory Visit (INDEPENDENT_AMBULATORY_CARE_PROVIDER_SITE_OTHER): Payer: Medicare Other | Admitting: Nurse Practitioner

## 2013-06-24 VITALS — BP 130/80 | HR 73 | Temp 97.4°F | Resp 18 | Wt 186.0 lb

## 2013-06-24 DIAGNOSIS — I10 Essential (primary) hypertension: Secondary | ICD-10-CM

## 2013-06-24 DIAGNOSIS — N189 Chronic kidney disease, unspecified: Secondary | ICD-10-CM

## 2013-06-24 DIAGNOSIS — J301 Allergic rhinitis due to pollen: Secondary | ICD-10-CM

## 2013-06-24 DIAGNOSIS — E785 Hyperlipidemia, unspecified: Secondary | ICD-10-CM

## 2013-06-24 DIAGNOSIS — Z23 Encounter for immunization: Secondary | ICD-10-CM

## 2013-06-24 MED ORDER — AMLODIPINE BESYLATE 10 MG PO TABS: 10.0000 mg | ORAL_TABLET | Freq: Every day | ORAL | Status: DC

## 2013-06-24 NOTE — Patient Instructions (Addendum)
May stop bystolic; start taking norvasc 10 mg daily Will have you check your blood pressure after sitting for 5 mins and record BP and HR  Please follow up in office after being on Norvasc for 2 week with BP and HR log (to have 4-5 readings)  Cont heart healthy; low sodium diet   May use PLAIN saline in nares; cont nettipot PLAIN Loratadine 10 mg daily for allergies    FOLLOW  Up for physical in 6 months with fasting lab work before visit (will check cholesterol)

## 2013-06-24 NOTE — Progress Notes (Signed)
Patient ID: Douglas Lowery, male   DOB: 04-28-1941, 72 y.o.   MRN: [id number]   No Known Allergies  Chief Complaint  Patient presents with  . Follow-up    HPI: Patient is a 72 y.o. male seen in the office today for routine follow up; Has not fasted this morning; and does not have recent lab work  Hypertension- blood pressure at home general is about 132/80s at home. Would like something cheaper besides bystolic; taking hydralazine TID  Hyperlipidemia- last lipids in jan were at goal; taking fenofibrate and prastatin daily; no side effects from medication   Review of Systems:  Review of Systems  Constitutional: Negative for fever, chills and malaise/fatigue.  Eyes: Positive for blurred vision.       Following with eye doctor  Respiratory: Negative for shortness of breath.   Cardiovascular: Negative for chest pain.  Gastrointestinal: Negative for heartburn, abdominal pain, diarrhea and constipation.  Genitourinary: Negative for dysuria, urgency and frequency.       See Dr Brunilda Payor for BPH  Musculoskeletal: Negative for myalgias, joint pain and falls.  Skin: Negative for itching and rash.  Neurological: Negative for dizziness, weakness and headaches.  Endo/Heme/Allergies: Positive for environmental allergies (uses netty pot).  Psychiatric/Behavioral: Negative for depression. The patient is not nervous/anxious and does not have insomnia.      Past Medical History  Diagnosis Date  . Hypertension   . Sinus drainage   . Hypercholesteremia   . Allergy   . Chronic kidney disease    Past Surgical History  Procedure Laterality Date  . Eye surgery  2013    Right eye cataract removed--Dr. Dione Booze  . Colonoscopy  02/2009    Sessile polyp in the rectum. Otherwise normal examination   Social History:   reports that he has never smoked. He does not have any smokeless tobacco history on file. He reports that  drinks alcohol. He reports that he does not use illicit drugs.  Family History   Problem Relation Age of Onset  . Pneumonia Father   . Alzheimer's disease Sister   . Hypertension Sister   . Hypertension Sister   . Hypertension Sister     Medications: Patient's Medications  New Prescriptions   No medications on file  Previous Medications   ASPIRIN 81 MG CHEWABLE TABLET    Chew 81 mg by mouth daily.   FENOFIBRATE 54 MG TABLET    Take 1 tablet (54 mg total) by mouth daily.   HYDRALAZINE (APRESOLINE) 50 MG TABLET    Take 1 tablet (50 mg total) by mouth 3 (three) times daily. Take one tablet three times daily for blood pressure   NEBIVOLOL (BYSTOLIC) 10 MG TABLET    Take 1 tablet (10 mg total) by mouth daily. Take one tablet once daily for blood pressure   PRAVASTATIN (PRAVACHOL) 40 MG TABLET    Take 1 tablet (40 mg total) by mouth daily.   SILODOSIN (RAPAFLO) 8 MG CAPS CAPSULE    Take 1 capsule (8 mg total) by mouth daily with breakfast.  Modified Medications   No medications on file  Discontinued Medications   No medications on file     Physical Exam:  Filed Vitals:   06/24/13 0824  BP: 160/84  Pulse: 73  Temp: 97.4 F (36.3 C)  TempSrc: Oral  Resp: 18  Weight: 186 lb (84.369 kg)  SpO2: 96%   Physical Exam  Vitals reviewed. Constitutional: He is oriented to person, place, and time and well-developed, well-nourished,  and in no distress. No distress.  HENT:  Head: Normocephalic and atraumatic.  Eyes: Conjunctivae and EOM are normal. Pupils are equal, round, and reactive to light.  Neck: Normal range of motion. Neck supple. No thyromegaly present.  Cardiovascular: Normal rate, regular rhythm, normal heart sounds and intact distal pulses.   Pulmonary/Chest: Effort normal and breath sounds normal. No respiratory distress.  Abdominal: Soft. Bowel sounds are normal. He exhibits no distension. There is no tenderness.  Musculoskeletal: Normal range of motion. He exhibits no edema and no tenderness.  Neurological: He is alert and oriented to person, place,  and time.  Skin: Skin is warm and dry. He is not diaphoretic.     Labs reviewed: Basic Metabolic Panel:  Recent Labs  16/10/96 1057  NA 139  K 3.9  CL 103  CO2 21  GLUCOSE 111*  BUN 16  CREATININE 1.31*  CALCIUM 9.2   10/21/2012 cholesterol 150, HDL 53, LDL 71, Trig 131   Assessment/Plan 1. Essential hypertension, benign -stop bystolic - start amLODipine (NORVASC) 10 MG tablet; Take 1 tablet (10 mg total) by mouth daily.  Dispense: 30 tablet; Refill: 3 - Basic metabolic panel - cont hear healthy low sodium diet -take blood pressure & HR and record and to bring log to next visit 2. Hyperlipemia -cont medication - Lipid panel; before next appt  3. Allergic rhinitis due to pollen -cont nettipot loratadine 10 mg daily -plain nasal spray  4. Chronic kidney disease - will follow up blood work today - CBC With differential/Platelet and Comprehensive metabolic panel; before 6 month visit

## 2013-06-25 LAB — BASIC METABOLIC PANEL
GFR calc Af Amer: 61 mL/min/{1.73_m2} (ref >59)
GFR calc non Af Amer: 52 mL/min/{1.73_m2} — ABNORMAL LOW (ref >59)
Potassium: 4.1 mmol/L (ref 3.5–5.2)
Sodium: 139 mmol/L (ref 134–144)

## 2013-07-08 ENCOUNTER — Ambulatory Visit (INDEPENDENT_AMBULATORY_CARE_PROVIDER_SITE_OTHER): Payer: Medicare Other | Admitting: Nurse Practitioner

## 2013-07-08 VITALS — BP 122/68 | HR 74 | Temp 98.0°F

## 2013-07-08 DIAGNOSIS — I10 Essential (primary) hypertension: Secondary | ICD-10-CM

## 2013-07-08 NOTE — Progress Notes (Signed)
Patient ID: Douglas Lowery, male   DOB: 08-28-1941, 72 y.o.   MRN: [id number] S: pt came for blood pressure check O:  Filed Vitals:   07/08/13 0903  BP: 122/68  Pulse: 74  Temp: 98 F (36.7 C)   A/P: hypertension: cont current blood pressure medications Current Outpatient Prescriptions on File Prior to Visit  Medication Sig Dispense Refill  . amLODipine (NORVASC) 10 MG tablet Take 1 tablet (10 mg total) by mouth daily.  30 tablet  3  . aspirin 81 MG chewable tablet Chew 81 mg by mouth daily.      . fenofibrate 54 MG tablet Take 1 tablet (54 mg total) by mouth daily.  30 tablet  3  . hydrALAZINE (APRESOLINE) 50 MG tablet Take 1 tablet (50 mg total) by mouth 3 (three) times daily. Take one tablet three times daily for blood pressure  90 tablet  0  . pravastatin (PRAVACHOL) 40 MG tablet Take 1 tablet (40 mg total) by mouth daily.  30 tablet  3  . silodosin (RAPAFLO) 8 MG CAPS capsule Take 1 capsule (8 mg total) by mouth daily with breakfast.  30 capsule  3   No current facility-administered medications on file prior to visit.

## 2013-08-10 ENCOUNTER — Other Ambulatory Visit: Payer: Self-pay | Admitting: Adult Health

## 2013-11-30 ENCOUNTER — Other Ambulatory Visit: Payer: Medicare Other

## 2013-11-30 DIAGNOSIS — N189 Chronic kidney disease, unspecified: Secondary | ICD-10-CM

## 2013-11-30 DIAGNOSIS — E785 Hyperlipidemia, unspecified: Secondary | ICD-10-CM

## 2013-12-01 LAB — COMPREHENSIVE METABOLIC PANEL
A/G RATIO: 1.5 (ref 1.1–2.5)
ALT: 15 IU/L (ref 0–44)
AST: 13 IU/L (ref 0–40)
Albumin: 4.2 g/dL (ref 3.5–4.8)
Alkaline Phosphatase: 101 IU/L (ref 39–117)
BUN/Creatinine Ratio: 16 (ref 10–22)
BUN: 21 mg/dL (ref 8–27)
CALCIUM: 9.7 mg/dL (ref 8.6–10.2)
CO2: 22 mmol/L (ref 18–29)
CREATININE: 1.34 mg/dL — AB (ref 0.76–1.27)
Chloride: 101 mmol/L (ref 97–108)
GFR calc Af Amer: 61 mL/min/{1.73_m2} (ref >59)
GFR, EST NON AFRICAN AMERICAN: 53 mL/min/{1.73_m2} — AB (ref >59)
GLOBULIN, TOTAL: 2.8 g/dL (ref 1.5–4.5)
GLUCOSE: 94 mg/dL (ref 65–99)
Potassium: 4 mmol/L (ref 3.5–5.2)
SODIUM: 140 mmol/L (ref 134–144)
TOTAL PROTEIN: 7 g/dL (ref 6.0–8.5)
Total Bilirubin: 0.7 mg/dL (ref 0.0–1.2)

## 2013-12-01 LAB — CBC WITH DIFFERENTIAL
BASOS: 1 %
Basophils Absolute: 0 10*3/uL (ref 0.0–0.2)
EOS: 6 %
Eosinophils Absolute: 0.3 10*3/uL (ref 0.0–0.4)
HCT: 46.6 % (ref 37.5–51.0)
HEMOGLOBIN: 15.6 g/dL (ref 12.6–17.7)
IMMATURE GRANS (ABS): 0 10*3/uL (ref 0.0–0.1)
IMMATURE GRANULOCYTES: 0 %
LYMPHS: 35 %
Lymphocytes Absolute: 2 10*3/uL (ref 0.7–3.1)
MCH: 30.6 pg (ref 26.6–33.0)
MCHC: 33.5 g/dL (ref 31.5–35.7)
MCV: 92 fL (ref 79–97)
MONOCYTES: 9 %
MONOS ABS: 0.5 10*3/uL (ref 0.1–0.9)
NEUTROS PCT: 49 %
Neutrophils Absolute: 2.8 10*3/uL (ref 1.4–7.0)
PLATELETS: 284 10*3/uL (ref 150–379)
RBC: 5.09 x10E6/uL (ref 4.14–5.80)
RDW: 12.9 % (ref 12.3–15.4)
WBC: 5.7 10*3/uL (ref 3.4–10.8)

## 2013-12-01 LAB — LIPID PANEL
CHOL/HDL RATIO: 4 ratio (ref 0.0–5.0)
Cholesterol, Total: 218 mg/dL — ABNORMAL HIGH (ref 100–199)
HDL: 55 mg/dL (ref >39)
LDL Calculated: 131 mg/dL — ABNORMAL HIGH (ref 0–99)
TRIGLYCERIDES: 162 mg/dL — AB (ref 0–149)
VLDL CHOLESTEROL CAL: 32 mg/dL (ref 5–40)

## 2013-12-02 ENCOUNTER — Encounter: Payer: Self-pay | Admitting: Internal Medicine

## 2013-12-02 ENCOUNTER — Ambulatory Visit (INDEPENDENT_AMBULATORY_CARE_PROVIDER_SITE_OTHER): Payer: Medicare Other | Admitting: Internal Medicine

## 2013-12-02 VITALS — BP 136/82 | HR 74 | Temp 97.8°F | Resp 10 | Ht 67.5 in | Wt 185.0 lb

## 2013-12-02 DIAGNOSIS — E785 Hyperlipidemia, unspecified: Secondary | ICD-10-CM

## 2013-12-02 DIAGNOSIS — Z0000 Encounter for general adult medical examination without abnormal findings: Secondary | ICD-10-CM

## 2013-12-02 DIAGNOSIS — N189 Chronic kidney disease, unspecified: Secondary | ICD-10-CM

## 2013-12-02 DIAGNOSIS — J301 Allergic rhinitis due to pollen: Secondary | ICD-10-CM

## 2013-12-02 DIAGNOSIS — Z23 Encounter for immunization: Secondary | ICD-10-CM

## 2013-12-02 DIAGNOSIS — I10 Essential (primary) hypertension: Secondary | ICD-10-CM

## 2013-12-02 DIAGNOSIS — N400 Enlarged prostate without lower urinary tract symptoms: Secondary | ICD-10-CM

## 2013-12-02 DIAGNOSIS — E782 Mixed hyperlipidemia: Secondary | ICD-10-CM | POA: Insufficient documentation

## 2013-12-02 MED ORDER — PRAVASTATIN SODIUM 80 MG PO TABS: 40.0000 mg | ORAL_TABLET | Freq: Every day | ORAL | Status: DC

## 2013-12-02 MED ORDER — TETANUS-DIPHTH-ACELL PERTUSSIS 5-2.5-18.5 LF-MCG/0.5 IM SUSP: 0.5000 mL | Freq: Once | INTRAMUSCULAR | Status: DC

## 2013-12-02 NOTE — Progress Notes (Signed)
Passed clock drawing 

## 2013-12-02 NOTE — Progress Notes (Signed)
Patient ID: Douglas Lowery, male   DOB: Feb 10, 1941, 73 y.o.   MRN: [id number]   Location:  [facility]  Code Status: need to address this at next [place] Complaint  Patient presents with  . Annual Exam    Yearly check-up, discuss labs (copy printed), MMSE     HPI: Patient is a 73 y.o. male seen in the office today for annual check up.   Has no concerns.  Did well on MMSE 29/30.    Allergies and sinuses doing pretty good.  Change in weather ok unless it gets warm--then has drainage.    BP satisfactory.    Takes pravachol for his cholesterol.  Has no idea if he has family history.  Has backed off a lot on his fried foods--esp fried chicken.  Still fries his fish.  Eating more baked fish, too.  Also talked about limiting sweets and starchy foods, too, and discussed portion sizes.  Does pretty much exercise.  Is working on his stomach--trying to get down to 160lbs.  Walks a lot, does some sit ups.  Has been a bit more laid back in the winter.      Using rapaflo to pass urine.  Uses it prn only.    Review of Systems:  Review of Systems  Constitutional: Positive for weight loss. Negative for fever, chills and malaise/fatigue.  HENT: Positive for congestion.   Eyes: Negative for blurred vision.       Sees Dr. Katy Fitch  Respiratory: Negative for shortness of breath.   Cardiovascular: Negative for chest pain, leg swelling and PND.  Gastrointestinal: Negative for heartburn, abdominal pain, constipation, blood in stool and melena.  Genitourinary: Negative for dysuria, urgency and frequency.  Musculoskeletal: Negative for falls and myalgias.  Skin: Negative for rash.  Neurological: Negative for dizziness, loss of consciousness, weakness and headaches.  Endo/Heme/Allergies: Does not bruise/bleed easily.  Psychiatric/Behavioral: Negative for depression and memory loss. The patient is not nervous/anxious and does not have insomnia.       Past Medical History  Diagnosis Date  . Hypertension   . Sinus drainage   . Hypercholesteremia   . Allergy   . Chronic kidney disease    Past Surgical History  Procedure Laterality Date  . Eye surgery  2013    Right eye cataract removed--Dr. Katy Fitch  . Colonoscopy  02/2009    Sessile polyp in the rectum. Otherwise normal examination    Social History:   reports that he has never smoked. He does not have any smokeless tobacco history on file. He reports that he drinks alcohol. He reports that he does not use illicit drugs.  Family History  Problem Relation Age of Onset  . Pneumonia Father   . Alzheimer's disease Sister   . Hypertension Sister   . Hypertension Sister   . Hypertension Sister     Medications: Patient's Medications  New Prescriptions   No medications on file  Previous Medications   ASPIRIN 81 MG CHEWABLE TABLET    Chew 81 mg by mouth daily.   HYDRALAZINE (APRESOLINE) 50 MG TABLET    TAKE 1 TABLET BY MOUTH 3 TIMES A DAY AS NEEDED FOR HIGH BLOOD PRESSUREW   TDAP (BOOSTRIX) 5-2.5-18.5 LF-MCG/0.5 INJECTION    Inject 0.5 mLs into the muscle once.  Modified Medications   Modified Medication Previous Medication   AMLODIPINE (NORVASC) 10 MG TABLET amLODipine (NORVASC) 10 MG tablet      Take  10 mg by mouth daily. For high blood pressure    Take 1 tablet (10 mg total) by mouth daily.   FENOFIBRATE 54 MG TABLET fenofibrate 54 MG tablet      Take 54 mg by mouth daily. For abnormal cholesterol    Take 1 tablet (54 mg total) by mouth daily.   PRAVASTATIN (PRAVACHOL) 40 MG TABLET pravastatin (PRAVACHOL) 40 MG tablet      Take 40 mg by mouth daily. For cholesterol    Take 1 tablet (40 mg total) by mouth daily.   SILODOSIN (RAPAFLO) 8 MG CAPS CAPSULE silodosin (RAPAFLO) 8 MG CAPS capsule      Take 8 mg by mouth daily with breakfast. For prostate    Take 1 capsule (8 mg total) by mouth daily with breakfast.  Discontinued Medications   No medications on file   Physical  Exam: Filed Vitals:   12/02/13 1354  BP: 136/82  Pulse: 74  Temp: 97.8 F (36.6 C)  TempSrc: Oral  Resp: 10  Height: 5' 7.5" (1.715 m)  Weight: 185 lb (83.915 kg)  SpO2: 97%  Physical Exam  Labs reviewed: Basic Metabolic Panel:  Recent Labs  12/21/12 1057 06/24/13 0930 11/30/13 0818  NA 139 139 140  K 3.9 4.1 4.0  CL 103 102 101  CO2 21 23 22   GLUCOSE 111* 122* 94  BUN 16 15 21   CREATININE 1.31* 1.35* 1.34*  CALCIUM 9.2 9.8 9.7   Liver Function Tests:  Recent Labs  11/30/13 0818  AST 13  ALT 15  ALKPHOS 101  BILITOT 0.7  PROT 7.0  CBC:  Recent Labs  11/30/13 0818  WBC 5.7  NEUTROABS 2.8  HGB 15.6  HCT 46.6  MCV 92  PLT 284   Lipid Panel:  Recent Labs  11/30/13 0818  HDL 55  LDLCALC 131*  TRIG 162*  CHOLHDL 4.0  EKG done today:  NSR with rate 65bpm   Assessment/Plan 1. Essential hypertension, benign - bp is satisfactory with hydralazine and amlodipine -EKG 12-Lead  2. Hyperlipidemia -increase pravachol to 80mg  at bedtime and continue to work on diet and exercise as counseled above in hpi -also continue fenofibrate--monitor lfts (CMP ordered)  3. Allergic rhinitis due to pollen -not actively problematic at this time  4. Chronic kidney disease -kidneys have been stable with current bp controlled  5. BPH (benign prostatic hyperplasia) -well controlled with rapaflo use (uses as needed rather than each day with benefit)  6. Encounter for preventive health examination -last cscope 2010, needed updated tdap, was counseled on obesity weight loss  7. Need for diphtheria-tetanus-pertussis (Tdap) vaccine, adult/adolescent -script given to get at pharmacy - Tdap ([place]) 5-2.5-18.5 LF-MCG/0.5 injection; Inject 0.5 mLs into the muscle once.  Dispense: 0.5 mL; Refill: 0  Labs/tests ordered:   Orders Placed This Encounter  Procedures  . CBC with Differential    Standing Status: Future     Number of Occurrences:      Standing Expiration  Date: 12/03/2014  . Comprehensive metabolic panel    Standing Status: Future     Number of Occurrences:      Standing Expiration Date: 12/03/2014  . Lipid panel    Standing Status: Future     Number of Occurrences:      Standing Expiration Date: 12/03/2014  . EKG 12-Lead    Order Specific Question:  Where should this test be performed    Answer:  OTHER   Next appt:  6 mos, labs before with  Janett Billow

## 2013-12-11 NOTE — Progress Notes (Signed)
This encounter was created in error - please disregard.

## 2014-01-20 LAB — HM DIABETES EYE EXAM

## 2014-03-24 LAB — HM DIABETES EYE EXAM

## 2014-05-05 LAB — HM DIABETES EYE EXAM

## 2014-06-03 ENCOUNTER — Other Ambulatory Visit: Payer: Medicare Other

## 2014-06-03 DIAGNOSIS — E785 Hyperlipidemia, unspecified: Secondary | ICD-10-CM

## 2014-06-03 DIAGNOSIS — I10 Essential (primary) hypertension: Secondary | ICD-10-CM

## 2014-06-03 DIAGNOSIS — N189 Chronic kidney disease, unspecified: Secondary | ICD-10-CM

## 2014-06-04 LAB — COMPREHENSIVE METABOLIC PANEL
ALT: 15 IU/L (ref 0–44)
AST: 16 IU/L (ref 0–40)
Albumin/Globulin Ratio: 1.4 (ref 1.1–2.5)
Albumin: 4.4 g/dL (ref 3.5–4.8)
Alkaline Phosphatase: 100 IU/L (ref 39–117)
BUN/Creatinine Ratio: 11 (ref 10–22)
BUN: 14 mg/dL (ref 8–27)
CO2: 24 mmol/L (ref 18–29)
Calcium: 9.9 mg/dL (ref 8.6–10.2)
Chloride: 101 mmol/L (ref 97–108)
Creatinine, Ser: 1.27 mg/dL (ref 0.76–1.27)
GFR calc Af Amer: 65 mL/min/{1.73_m2} (ref >59)
GFR calc non Af Amer: 56 mL/min/{1.73_m2} — ABNORMAL LOW (ref >59)
Globulin, Total: 3.1 g/dL (ref 1.5–4.5)
Glucose: 105 mg/dL — ABNORMAL HIGH (ref 65–99)
Potassium: 4 mmol/L (ref 3.5–5.2)
Sodium: 139 mmol/L (ref 134–144)
Total Bilirubin: 0.6 mg/dL (ref 0.0–1.2)
Total Protein: 7.5 g/dL (ref 6.0–8.5)

## 2014-06-04 LAB — CBC WITH DIFFERENTIAL/PLATELET
Basophils Absolute: 0.1 10*3/uL (ref 0.0–0.2)
Basos: 1 %
Eos: 5 %
Eosinophils Absolute: 0.3 10*3/uL (ref 0.0–0.4)
HCT: 47.5 % (ref 37.5–51.0)
Hemoglobin: 15.5 g/dL (ref 12.6–17.7)
Immature Grans (Abs): 0 10*3/uL (ref 0.0–0.1)
Immature Granulocytes: 0 %
Lymphocytes Absolute: 2.5 10*3/uL (ref 0.7–3.1)
Lymphs: 41 %
MCH: 31.3 pg (ref 26.6–33.0)
MCHC: 32.6 g/dL (ref 31.5–35.7)
MCV: 96 fL (ref 79–97)
Monocytes Absolute: 0.5 10*3/uL (ref 0.1–0.9)
Monocytes: 8 %
Neutrophils Absolute: 2.8 10*3/uL (ref 1.4–7.0)
Neutrophils Relative %: 45 %
RBC: 4.95 x10E6/uL (ref 4.14–5.80)
RDW: 13.2 % (ref 12.3–15.4)
WBC: 6.3 10*3/uL (ref 3.4–10.8)

## 2014-06-04 LAB — LIPID PANEL
Chol/HDL Ratio: 4.4 ratio units (ref 0.0–5.0)
Cholesterol, Total: 243 mg/dL — ABNORMAL HIGH (ref 100–199)
HDL: 55 mg/dL (ref >39)
LDL Calculated: 150 mg/dL — ABNORMAL HIGH (ref 0–99)
Triglycerides: 191 mg/dL — ABNORMAL HIGH (ref 0–149)
VLDL Cholesterol Cal: 38 mg/dL (ref 5–40)

## 2014-06-09 ENCOUNTER — Encounter: Payer: Self-pay | Admitting: Internal Medicine

## 2014-06-09 ENCOUNTER — Ambulatory Visit (INDEPENDENT_AMBULATORY_CARE_PROVIDER_SITE_OTHER): Payer: Medicare Other | Admitting: Internal Medicine

## 2014-06-09 VITALS — BP 158/98 | HR 69 | Temp 98.5°F | Resp 18 | Ht 67.5 in | Wt 180.4 lb

## 2014-06-09 DIAGNOSIS — E663 Overweight: Secondary | ICD-10-CM

## 2014-06-09 DIAGNOSIS — N183 Chronic kidney disease, stage 3 unspecified: Secondary | ICD-10-CM

## 2014-06-09 DIAGNOSIS — J301 Allergic rhinitis due to pollen: Secondary | ICD-10-CM

## 2014-06-09 DIAGNOSIS — E782 Mixed hyperlipidemia: Secondary | ICD-10-CM

## 2014-06-09 DIAGNOSIS — Z23 Encounter for immunization: Secondary | ICD-10-CM

## 2014-06-09 DIAGNOSIS — I10 Essential (primary) hypertension: Secondary | ICD-10-CM

## 2014-06-09 DIAGNOSIS — N400 Enlarged prostate without lower urinary tract symptoms: Secondary | ICD-10-CM

## 2014-06-09 NOTE — Patient Instructions (Signed)
Try to get away from margarine and use olive oil instead to help lower your cholesterol Also try to cut back on candy Continue your walking--try to do 20 mins per day at least.

## 2014-06-09 NOTE — Progress Notes (Signed)
Patient ID: Douglas Lowery, male   DOB: 01-18-1941, 73 y.o.   MRN: [id number]   Location:  [facility]  Code Status: does not have living will, hcpoa  No Known Allergies  Chief Complaint  Patient presents with  . Medical Management of Chronic Issues    HPI: Patient is a 73 y.o. black male seen in the office today for medical mgt of chronic diseases.  He did not take his hydralazine this am.  He was running late this morning.    [id number] range systolic.  Both his TG and LDL have gone up since last visit.  Is avoiding yolks of eggs.  Doesn't have much breakfast meat.   Uses margarine.  Thinks it is probably the cause of problems. Says he could back off on the candy a little.  Might even eat it in the middle of the night.   No longer has to get up to urinate 5-6x like he used to.  Rapaflo has helped him.    His allergies have improved.    Has lost 4.6 lbs.  Is walking more.  Walks around his complex. Stops if he gets sweaty and short of breath.  Was doing situps with his son sitting on his feet.    Says he had a chicken pox vaccine as a child when he went to private school.  Review of Systems:  Review of Systems  Constitutional: Negative for fever and malaise/fatigue.  HENT: Negative for hearing loss.   Eyes: Negative for blurred vision.       Glasses  Respiratory: Negative for shortness of breath.   Cardiovascular: Negative for chest pain.  Gastrointestinal: Negative for abdominal pain, diarrhea, constipation, blood in stool and melena.  Genitourinary: Negative for dysuria, urgency, frequency and hematuria.  Musculoskeletal: Negative for falls and myalgias.  Skin: Negative for rash.  Neurological: Negative for dizziness, loss of consciousness and headaches.  Endo/Heme/Allergies: Does not bruise/bleed easily.  Psychiatric/Behavioral: Negative for depression and memory loss. The patient has insomnia. The  patient is not nervous/anxious.        Says he would sleep more if he worked!    Past Medical History  Diagnosis Date  . Hypertension   . Sinus drainage   . Hypercholesteremia   . Allergy   . Chronic kidney disease     Past Surgical History  Procedure Laterality Date  . Eye surgery  2013    Right eye cataract removed--Dr. Katy Fitch  . Colonoscopy  02/2009    Sessile polyp in the rectum. Otherwise normal examination    Social History:   reports that he has never smoked. He does not have any smokeless tobacco history on file. He reports that he drinks alcohol. He reports that he does not use illicit drugs.  Family History  Problem Relation Age of Onset  . Pneumonia Father   . Alzheimer's disease Sister   . Hypertension Sister   . Hypertension Sister   . Hypertension Sister     Medications: Patient's Medications  New Prescriptions   No medications on file  Previous Medications   AMLODIPINE (NORVASC) 10 MG TABLET    Take 10 mg by mouth daily. For high blood pressure   ASPIRIN 81 MG CHEWABLE TABLET    Chew 81 mg by mouth daily.   FENOFIBRATE 54 MG TABLET    Take 54 mg by mouth daily. For abnormal cholesterol   HYDRALAZINE (APRESOLINE) 50 MG TABLET  TAKE 1 TABLET BY MOUTH 3 TIMES A DAY AS NEEDED FOR HIGH BLOOD PRESSUREW   PRAVASTATIN (PRAVACHOL) 80 MG TABLET    Take 0.5 tablets (40 mg total) by mouth daily. For cholesterol   SILODOSIN (RAPAFLO) 8 MG CAPS CAPSULE    Take 8 mg by mouth daily with breakfast. For prostate   TDAP (BOOSTRIX) 5-2.5-18.5 LF-MCG/0.5 INJECTION    Inject 0.5 mLs into the muscle once.  Modified Medications   No medications on file  Discontinued Medications   No medications on file     Physical Exam: Filed Vitals:   06/09/14 1010  BP: 158/98  Pulse: 69  Temp: 98.5 F (36.9 C)  TempSrc: Oral  Resp: 18  Height: 5' 7.5" (1.715 m)  Weight: 180 lb 6.4 oz (81.829 kg)  SpO2: 97%  Physical Exam  Constitutional: He is oriented to person, place, and  time. He appears well-developed and well-nourished. No distress.  HENT:  Head: Normocephalic and atraumatic.  Cardiovascular: Normal rate, regular rhythm, normal heart sounds and intact distal pulses.   Pulmonary/Chest: Effort normal and breath sounds normal. No respiratory distress.  Abdominal: Soft. Bowel sounds are normal. He exhibits no distension and no mass. There is no tenderness.  Musculoskeletal: Normal range of motion. He exhibits no edema and no tenderness.  Neurological: He is alert and oriented to person, place, and time.  Skin: Skin is warm and dry.  Psychiatric: He has a normal mood and affect.   Labs reviewed: Basic Metabolic Panel:  Recent Labs  06/24/13 0930 11/30/13 0818 06/03/14 0832  NA 139 140 139  K 4.1 4.0 4.0  CL 102 101 101  CO2 23 22 24   GLUCOSE 122* 94 105*  BUN 15 21 14   CREATININE 1.35* 1.34* 1.27  CALCIUM 9.8 9.7 9.9   Liver Function Tests:  Recent Labs  11/30/13 0818 06/03/14 0832  AST 13 16  ALT 15 15  ALKPHOS 101 100  BILITOT 0.7 0.6  PROT 7.0 7.5  CBC:  Recent Labs  11/30/13 0818 06/03/14 0832  WBC 5.7 6.3  NEUTROABS 2.8 2.8  HGB 15.6 15.5  HCT 46.6 47.5  MCV 92 96  PLT 284  --    Lipid Panel:  Recent Labs  11/30/13 0818 06/03/14 0832  HDL 55 55  LDLCALC 131* 150*  TRIG 162* 191*  CHOLHDL 4.0 4.4    Assessment/Plan 1. Essential hypertension, benign - bp was elevated today but he had not taken his hydralazine -cont increased exercise and also discussed in detail some dietary changes esp changing margarine to olive oil - Basic metabolic panel; Future  2. Allergic rhinitis due to pollen - has improved  3. BPH (benign prostatic hyperplasia) -cont rapaflo which has helped tremendously for him  4. Mixed hyperlipidemia - extensively counseled on diet today and walking more for exercise -Lipid panel; Future - Hemoglobin A1c; Future  5. Chronic kidney disease, stage 3 - stable, actually a bit improved on his  last labs probably due to improved bp control on current regimen - Basic metabolic panel; Future  6. Need for immunization against influenza -given flu shot today  7. Overweight (BMI 25.0-29.9) -has lost 4 lbs -cont increased walking and work on dietary changes as in instructions  Labs/tests ordered:   Orders Placed This Encounter  Procedures  . Flu Vaccine QUAD 36+ mos PF IM (Fluarix Quad PF)  . Lipid panel    Standing Status: Future     Number of Occurrences:  Standing Expiration Date: 12/08/2014    Order Specific Question:  Has the patient fasted?    Answer:  Yes  . Hemoglobin A1c    Standing Status: Future     Number of Occurrences:      Standing Expiration Date: 12/08/2014  . CBC With differential/Platelet    Standing Status: Future     Number of Occurrences:      Standing Expiration Date: 12/08/2014  . Basic metabolic panel    Standing Status: Future     Number of Occurrences:      Standing Expiration Date: 12/08/2014    Order Specific Question:  Has the patient fasted?    Answer:  Yes    Next appt:  3 mos with labs before

## 2014-06-16 ENCOUNTER — Other Ambulatory Visit: Payer: Self-pay | Admitting: Nurse Practitioner

## 2014-06-16 DIAGNOSIS — E785 Hyperlipidemia, unspecified: Secondary | ICD-10-CM

## 2014-06-16 MED ORDER — PRAVASTATIN SODIUM 80 MG PO TABS: ORAL_TABLET | ORAL | Status: DC

## 2014-06-16 NOTE — Telephone Encounter (Signed)
[facility] 

## 2014-08-28 ENCOUNTER — Other Ambulatory Visit: Payer: Self-pay | Admitting: Nurse Practitioner

## 2014-10-31 ENCOUNTER — Other Ambulatory Visit: Payer: Medicare Other

## 2014-10-31 DIAGNOSIS — J301 Allergic rhinitis due to pollen: Secondary | ICD-10-CM

## 2014-10-31 DIAGNOSIS — N183 Chronic kidney disease, stage 3 unspecified: Secondary | ICD-10-CM

## 2014-10-31 DIAGNOSIS — E782 Mixed hyperlipidemia: Secondary | ICD-10-CM

## 2014-10-31 DIAGNOSIS — I10 Essential (primary) hypertension: Secondary | ICD-10-CM

## 2014-11-01 LAB — BASIC METABOLIC PANEL
BUN/Creatinine Ratio: 10 (ref 10–22)
BUN: 14 mg/dL (ref 8–27)
CO2: 19 mmol/L (ref 18–29)
Calcium: 10 mg/dL (ref 8.6–10.2)
Chloride: 102 mmol/L (ref 97–108)
Creatinine, Ser: 1.36 mg/dL — ABNORMAL HIGH (ref 0.76–1.27)
GFR calc Af Amer: 59 mL/min/{1.73_m2} — ABNORMAL LOW (ref >59)
GFR calc non Af Amer: 51 mL/min/{1.73_m2} — ABNORMAL LOW (ref >59)
Glucose: 92 mg/dL (ref 65–99)
Potassium: 4.3 mmol/L (ref 3.5–5.2)
Sodium: 139 mmol/L (ref 134–144)

## 2014-11-01 LAB — CBC WITH DIFFERENTIAL
Basophils Absolute: 0.1 10*3/uL (ref 0.0–0.2)
Basos: 2 %
Eos: 8 %
Eosinophils Absolute: 0.4 10*3/uL (ref 0.0–0.4)
HCT: 45.5 % (ref 37.5–51.0)
Hemoglobin: 15.6 g/dL (ref 12.6–17.7)
Immature Grans (Abs): 0 10*3/uL (ref 0.0–0.1)
Immature Granulocytes: 0 %
Lymphocytes Absolute: 1.9 10*3/uL (ref 0.7–3.1)
Lymphs: 36 %
MCH: 31.5 pg (ref 26.6–33.0)
MCHC: 34.3 g/dL (ref 31.5–35.7)
MCV: 92 fL (ref 79–97)
Monocytes Absolute: 0.4 10*3/uL (ref 0.1–0.9)
Monocytes: 8 %
Neutrophils Absolute: 2.4 10*3/uL (ref 1.4–7.0)
Neutrophils Relative %: 46 %
RBC: 4.96 x10E6/uL (ref 4.14–5.80)
RDW: 13.4 % (ref 12.3–15.4)
WBC: 5.2 10*3/uL (ref 3.4–10.8)

## 2014-11-01 LAB — HEMOGLOBIN A1C
Est. average glucose Bld gHb Est-mCnc: 108 mg/dL
Hgb A1c MFr Bld: 5.4 % (ref 4.8–5.6)

## 2014-11-01 LAB — LIPID PANEL
Chol/HDL Ratio: 3.5 ratio units (ref 0.0–5.0)
Cholesterol, Total: 214 mg/dL — ABNORMAL HIGH (ref 100–199)
HDL: 61 mg/dL (ref >39)
LDL Calculated: 121 mg/dL — ABNORMAL HIGH (ref 0–99)
Triglycerides: 158 mg/dL — ABNORMAL HIGH (ref 0–149)
VLDL Cholesterol Cal: 32 mg/dL (ref 5–40)

## 2014-11-03 ENCOUNTER — Encounter: Payer: Self-pay | Admitting: Internal Medicine

## 2014-11-03 ENCOUNTER — Ambulatory Visit (INDEPENDENT_AMBULATORY_CARE_PROVIDER_SITE_OTHER): Payer: Medicare Other | Admitting: Internal Medicine

## 2014-11-03 VITALS — BP 158/80 | HR 72 | Temp 97.0°F | Resp 18 | Ht 67.5 in | Wt 185.0 lb

## 2014-11-03 DIAGNOSIS — R351 Nocturia: Secondary | ICD-10-CM

## 2014-11-03 DIAGNOSIS — Z0000 Encounter for general adult medical examination without abnormal findings: Secondary | ICD-10-CM

## 2014-11-03 DIAGNOSIS — Z23 Encounter for immunization: Secondary | ICD-10-CM

## 2014-11-03 DIAGNOSIS — N400 Enlarged prostate without lower urinary tract symptoms: Secondary | ICD-10-CM

## 2014-11-03 DIAGNOSIS — I10 Essential (primary) hypertension: Secondary | ICD-10-CM

## 2014-11-03 DIAGNOSIS — E785 Hyperlipidemia, unspecified: Secondary | ICD-10-CM

## 2014-11-03 DIAGNOSIS — N183 Chronic kidney disease, stage 3 unspecified: Secondary | ICD-10-CM

## 2014-11-03 LAB — HM DIABETES EYE EXAM

## 2014-11-03 MED ORDER — ZOSTER VACCINE LIVE 19400 UNT/0.65ML ~~LOC~~ SOLR: 0.6500 mL | Freq: Once | SUBCUTANEOUS | Status: DC

## 2014-11-03 MED ORDER — PRAVASTATIN SODIUM 80 MG PO TABS: ORAL_TABLET | ORAL | Status: DC

## 2014-11-03 NOTE — Progress Notes (Signed)
Patient ID: Douglas Lowery, male   DOB: April 28, 1941, 74 y.o.   MRN: [id number]   Location:  [facility] / [place]   No Known Allergies  Chief Complaint  Patient presents with  . Annual Exam    HPI: Patient is a 74 y.o. black male seen in the office today for annual exam.   Reports no new complaints.   BP rechecked in office 138/52 Patient states he is taking blood pressure medicine. Checks blood pressure at home and says it is 128-138. Does not use added salt on food.  Kidney function is decreased. Denies urinary changes, edema, SOB, dizziness. Is followed by Dr. Janice Norrie.  Getting up at night 12-15 times to use the bathroom. Taking hydralazine twice a day. Drinking a lot of water during the day and before bed. Denies difficulty initiating stream. He is only taking Rapaflo once every two weeks due to it creates urgency for him and he feels he can't hold his water.  Lipids and triglycerides elevated. Patient states he is taking medication as directed. Walks for exercise and cleans yards.  Agrees to pneumovax and zostavax today.    Review of Systems:  Review of Systems  Constitutional: Negative for fever, chills, weight loss and malaise/fatigue.  HENT: Negative for congestion, hearing loss and sore throat.   Eyes: Negative for blurred vision and double vision.       Sees Dr. Zadie Rhine- opthamogist, having cataract surgery in a few weeks  Respiratory: Negative for cough and wheezing.   Cardiovascular: Negative for chest pain, palpitations and orthopnea.  Gastrointestinal: Negative for heartburn, nausea, vomiting, abdominal pain, diarrhea and constipation.  Genitourinary: Positive for urgency and frequency. Negative for dysuria and hematuria.       Followed by Dr. Janice Norrie- Urologist  Musculoskeletal: Negative for myalgias, back pain, joint pain, falls and neck pain.  Skin: Negative for itching and rash.  Neurological: Negative for dizziness, tingling, tremors  and headaches.  Endo/Heme/Allergies: Negative for environmental allergies and polydipsia.  Psychiatric/Behavioral: Negative for depression, suicidal ideas and substance abuse. The patient is not nervous/anxious and does not have insomnia.     Past Medical History  Diagnosis Date  . Hypertension   . Sinus drainage   . Hypercholesteremia   . Allergy   . Chronic kidney disease     Past Surgical History  Procedure Laterality Date  . Eye surgery  2013    Right eye cataract removed--Dr. Katy Fitch  . Colonoscopy  02/2009    Sessile polyp in the rectum. Otherwise normal examination    Social History:   reports that he has never smoked. He does not have any smokeless tobacco history on file. He reports that he drinks alcohol. He reports that he does not use illicit drugs.  Family History  Problem Relation Age of Onset  . Pneumonia Father   . Alzheimer's disease Sister   . Hypertension Sister   . Hypertension Sister   . Hypertension Sister     Medications: Patient's Medications  New Prescriptions   No medications on file  Previous Medications   AMLODIPINE (NORVASC) 10 MG TABLET    TAKE 1 TABLET BY MOUTH EVERY DAY   ASPIRIN 81 MG CHEWABLE TABLET    Chew 81 mg by mouth daily.   FENOFIBRATE 54 MG TABLET    TAKE 1 TABLET (54 MG TOTAL) BY MOUTH DAILY.   HYDRALAZINE (APRESOLINE) 50 MG TABLET    TAKE 1 TABLET BY MOUTH 3 TIMES A DAY AS  NEEDED FOR HIGH BLOOD PRESSUREW   PRAVASTATIN (PRAVACHOL) 80 MG TABLET    Take 1/2 tablet by mouth once daily for cholesterol   SILODOSIN (RAPAFLO) 8 MG CAPS CAPSULE    Take 8 mg by mouth daily with breakfast. For prostate   TDAP (BOOSTRIX) 5-2.5-18.5 LF-MCG/0.5 INJECTION    Inject 0.5 mLs into the muscle once.  Modified Medications   No medications on file  Discontinued Medications   AMLODIPINE (NORVASC) 10 MG TABLET    Take 10 mg by mouth daily. For high blood pressure   FENOFIBRATE 54 MG TABLET    Take 54 mg by mouth daily. For abnormal cholesterol      Physical Exam: Filed Vitals:   11/03/14 1408  BP: 158/80  Pulse: 72  Temp: 97 F (36.1 C)  TempSrc: Oral  Resp: 18  Height: 5' 7.5" (1.715 m)  Weight: 185 lb (83.915 kg)  SpO2: 95%   Physical Exam  Constitutional: He is oriented to person, place, and time. He appears well-developed and well-nourished.  HENT:  Head: Normocephalic and atraumatic.  Right Ear: External ear normal.  Left Ear: External ear normal.  Nose: Nose normal.  Mouth/Throat: Oropharynx is clear and moist.  Eyes: EOM are normal. Pupils are equal, round, and reactive to light.  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 Neck supple. No thyromegaly present.  Cardiovascular: Normal rate, regular rhythm and intact distal pulses.  Exam reveals no gallop and no friction rub.   No murmur heard. Pulmonary/Chest: Effort normal.  Abdominal: Soft. Bowel sounds are normal. He exhibits no distension.  Genitourinary: Rectum normal.  Musculoskeletal: Normal range of motion. He exhibits no edema or tenderness.  Neurological: He is alert and oriented to person, place, and time. He has normal reflexes. He displays normal reflexes. No cranial nerve deficit. He exhibits normal muscle tone. Coordination normal.  Skin: Skin is warm and dry.  Hyperpigmentation in right axillary area- pt reports has always been there  Psychiatric: He has a normal mood and affect. His behavior is normal. Judgment and thought content normal.    Labs reviewed: Basic Metabolic Panel:  Recent Labs  11/30/13 0818 06/03/14 0832 10/31/14 0920  NA 140 139 139  K 4.0 4.0 4.3  CL 101 101 102  CO2 22 24 19   GLUCOSE 94 105* 92  BUN 21 14 14   CREATININE 1.34* 1.27 1.36*  CALCIUM 9.7 9.9 10.0   Liver Function Tests:  Recent Labs  11/30/13 0818 06/03/14 0832  AST 13 16  ALT 15 15  ALKPHOS 101 100  BILITOT 0.7 0.6  PROT 7.0 7.5    No results for input(s): LIPASE, AMYLASE in the last 8760 hours. No results for input(s): AMMONIA in the last 8760 hours. CBC:  Recent Labs  11/30/13 0818 06/03/14 0832 10/31/14 0920  WBC 5.7 6.3 5.2  NEUTROABS 2.8 2.8 2.4  HGB 15.6 15.5 15.6  HCT 46.6 47.5 45.5  MCV 92 96 92  PLT 284  --   --    Lipid Panel:  Recent Labs  11/30/13 0818 06/03/14 0832 10/31/14 0920  HDL 55 55 61  LDLCALC 131* 150* 121*  TRIG 162* 191* 158*  CHOLHDL 4.0 4.4 3.5   Lab Results  Component Value Date   HGBA1C 5.4 10/31/2014    1. Mixed hyperlipidemia Increase pravastatin dose to 80 from 40mg  and cont fenofibrate due to mixed  hyperlipidemia   - pravastatin (PRAVACHOL) 80 MG tablet; Take 1 tablet by mouth once daily for cholesterol  Dispense: 30 tablet; Refill: 3  2. Encounter for preventive health examination -has had cscope -follows with Dr. Janice Norrie about his urinary frequency and nocturia but only taking rapaflo once a month or so when symptoms of difficulty with stream worsen--need f/u -zostavax Rx given -Prevnar given today -flu shot was given 06/09/14 -had pneumovax in 2010 -fall risk was normal in 3/15 Not depressed No noted memory loss but does not seem highly educated 3. Chronic kidney disease, stage 3 (moderate) Continue to follow with Dr. Janice Norrie.  4. BPH (benign prostatic hyperplasia) Continue Rapaflo and follow with Dr. Janice Norrie.  5. Essential hypertension, benign Continue current medications. Continue to spot check at pharmacy. BP was improved to 138/92 later in visit  6. Nocturia more than twice per night Decrease fluid intake 2 hours before bedtime. Discuss symptoms with Dr. Edrick Oh f/u as he also went 4x while here.    Next appt: 4 months  Ryleigh Buenger L. Crystol Walpole, D.O. [facility] [address], [place] [address] Cell Phone (Mon-Fri 8am-5pm):  [phone] On Call:  [phone] & follow prompts after 5pm &  weekends Office Phone:  [phone] Office Fax:  [phone]

## 2014-11-03 NOTE — Patient Instructions (Addendum)
Take whole pravastatin instead of half.  Avoid drinking fluids 2 hours before bed time.   Cholesterol Cholesterol is a fat. Your body needs a small amount of cholesterol. Cholesterol may build up in your blood vessels. This increases your chance of having a heart attack or stroke. You cannot feel your cholesterol levels. The only way to know your cholesterol level is high is with a blood test. Keep your test results. Work with your doctor to keep your cholesterol at a good level. WHAT DO THE TEST RESULTS MEAN?  Total cholesterol is how much cholesterol is in your blood.  LDL is bad cholesterol. This is the type that can build up. You want LDL to be low.  HDL is good cholesterol. It cleans your blood vessels and carries LDL away. You want HDL to be high.  Triglycerides are fat that the body can burn for energy or store. WHAT ARE GOOD LEVELS OF CHOLESTEROL?  Total cholesterol below 200.  LDL below 100 for people at risk. Below 70 for those at very high risk.  HDL above 50 is good. Above 60 is best.  Triglycerides below 150. HOW CAN I LOWER MY CHOLESTEROL?  Diet. Follow your diet programs as told by your doctor.  Choose fish, white meat chicken, roasted [place], or baked [place]. Try not to eat red meat, fried foods, or processed meats such as sausage and lunch meats.  Eat lots of fresh fruits and vegetables.  Choose whole grains, beans, pasta, potatoes, and cereals.  Use only small amounts of olive, corn, or canola oils.  Try not to eat butter, mayonnaise, shortening, or palm kernel oils.  Try not to eat foods with trans fats.  Drink skim or nonfat milk. Eat low-fat or nonfat yogurt and cheeses. Try not to drink whole milk or cream. Try not to eat ice cream, egg yolks, and full-fat cheeses.  Healthy desserts include angel food cake, ginger snaps, animal crackers, hard candy, popsicles, and low-fat or nonfat frozen yogurt. Try not to eat pastries, cakes, pies, and  cookies.  Exercise. Follow your exercise programs as told by your doctor.  Be more active. You can try gardening, walking, or taking the stairs. Ask your doctor about how you can be more active.  Medicine. Take medicine as told by your doctor. Document Released: 12/13/2008 Document Revised: 01/31/2014 Document Reviewed: 06/30/2013 [facility] Patient Information 2015 [place], [place]. This information is not intended to replace advice given to you by your health care provider. Make sure you discuss any questions you have with your health care provider.

## 2015-03-06 ENCOUNTER — Encounter: Payer: Self-pay | Admitting: Internal Medicine

## 2015-03-06 ENCOUNTER — Ambulatory Visit (INDEPENDENT_AMBULATORY_CARE_PROVIDER_SITE_OTHER): Payer: Medicare Other | Admitting: Internal Medicine

## 2015-03-06 VITALS — BP 132/84 | HR 62 | Temp 97.7°F | Wt 186.0 lb

## 2015-03-06 DIAGNOSIS — N183 Chronic kidney disease, stage 3 unspecified: Secondary | ICD-10-CM

## 2015-03-06 DIAGNOSIS — F439 Reaction to severe stress, unspecified: Secondary | ICD-10-CM

## 2015-03-06 DIAGNOSIS — E782 Mixed hyperlipidemia: Secondary | ICD-10-CM | POA: Diagnosis not present

## 2015-03-06 DIAGNOSIS — I10 Essential (primary) hypertension: Secondary | ICD-10-CM

## 2015-03-06 DIAGNOSIS — N400 Enlarged prostate without lower urinary tract symptoms: Secondary | ICD-10-CM

## 2015-03-06 DIAGNOSIS — Z658 Other specified problems related to psychosocial circumstances: Secondary | ICD-10-CM

## 2015-03-06 NOTE — Progress Notes (Signed)
Patient ID: Douglas Lowery, male   DOB: December 14, 1940, 74 y.o.   MRN: [id number]   Location:  [facility]  Code Status: full code Goals of Care: Advanced Directive information Does patient have an advance directive?: No, Would patient like information on creating an advanced directive?: Yes - Educational materials given (in past)   Chief Complaint  Patient presents with  . Medical Management of Chronic Issues    blood pressure, cholesterol, CKD    HPI: Patient is a 74 y.o.  seen in the office today for med mgt of chronic diseases.    Wants to live another 74 years.    Rash and itching has gone away.    BP still above goal of 140/90.  Checks it at [facility].  Is under a lot of stress these days.  Is going to granddaughter's graduation in [place] area.  Is worried about what to get her and where.  Also stressing about family reunion, anxious.  Wants things in line and in order.  Likes to plan ahead.    MMSE - Mini Mental State Exam 12/02/2013  Orientation to time 5  Orientation to Place 5  Registration 3  Attention/ Calculation 5  Recall 2  Language- name 2 objects 2  Language- repeat 1  Language- follow 3 step command 3  Language- read & follow direction 1  Write a sentence 1  Copy design 1  Total score 29  scored 4 on 6 CIT which is in the normal range.   Not depressed, no falls.  Is anxious. Has no signs or symptoms of PAD.    Review of Systems:  Review of Systems  Constitutional: Negative for fever, chills and malaise/fatigue.  HENT: Negative for hearing loss.   Eyes: Negative for blurred vision.  Respiratory: Negative for shortness of breath.   Cardiovascular: Negative for chest pain and leg swelling.  Gastrointestinal: Negative for abdominal pain, diarrhea, constipation, blood in stool and melena.  Genitourinary: Negative for dysuria, urgency and frequency.       No longer having nocturia since he cut back on his fluid intake  before bed  Musculoskeletal: Negative for joint pain and falls.  Neurological: Negative for dizziness and weakness.  Endo/Heme/Allergies: Does not bruise/bleed easily.  Psychiatric/Behavioral: Negative for depression and memory loss. The patient is nervous/anxious.     Past Medical History  Diagnosis Date  . Hypertension   . Sinus drainage   . Hypercholesteremia   . Allergy   . Chronic kidney disease     Past Surgical History  Procedure Laterality Date  . Eye surgery  2013    Right eye cataract removed--Dr. Katy Fitch  . Colonoscopy  02/2009    Sessile polyp in the rectum. Otherwise normal examination    No Known Allergies Medications: Patient's Medications  New Prescriptions   No medications on file  Previous Medications   AMLODIPINE (NORVASC) 10 MG TABLET    TAKE 1 TABLET BY MOUTH EVERY DAY   ASPIRIN 81 MG CHEWABLE TABLET    Chew 81 mg by mouth daily.   FENOFIBRATE 54 MG TABLET    TAKE 1 TABLET (54 MG TOTAL) BY MOUTH DAILY.   HYDRALAZINE (APRESOLINE) 50 MG TABLET    TAKE 1 TABLET BY MOUTH 3 TIMES A DAY AS NEEDED FOR HIGH BLOOD PRESSUREW   PRAVASTATIN (PRAVACHOL) 80 MG TABLET    Take 1 tablet by mouth once daily for cholesterol   SILODOSIN (RAPAFLO) 8 MG CAPS CAPSULE  Take 8 mg by mouth daily with breakfast. For prostate  Modified Medications   No medications on file  Discontinued Medications   TDAP (BOOSTRIX) 5-2.5-18.5 LF-MCG/0.5 INJECTION    Inject 0.5 mLs into the muscle once.   ZOSTER VACCINE LIVE, PF, (ZOSTAVAX) 57262 UNT/0.65ML INJECTION    Inject 19,400 Units into the skin once.    Physical Exam: Filed Vitals:   03/06/15 1314  BP: 140/86  Pulse: 62  Temp: 97.7 F (36.5 C)  TempSrc: Oral  Weight: 186 lb (84.369 kg)  SpO2: 97%   Physical Exam  Constitutional: He is oriented to person, place, and time. He appears well-developed and well-nourished. No distress.  Cardiovascular: Normal rate, regular rhythm, normal heart sounds and intact distal pulses.     Pulmonary/Chest: Effort normal and breath sounds normal. No respiratory distress. He has no rales.  Abdominal: Soft. Bowel sounds are normal. He exhibits no distension and no mass. There is no tenderness.  Musculoskeletal: Normal range of motion. He exhibits no edema.  Neurological: He is alert and oriented to person, place, and time.  Skin: Skin is warm and dry. No rash noted.  Psychiatric: He has a normal mood and affect.  Anxious, talks fast    Labs reviewed: Basic Metabolic Panel:  Recent Labs  06/03/14 0832 10/31/14 0920  NA 139 139  K 4.0 4.3  CL 101 102  CO2 24 19  GLUCOSE 105* 92  BUN 14 14  CREATININE 1.27 1.36*  CALCIUM 9.9 10.0   Liver Function Tests:  Recent Labs  06/03/14 0832  AST 16  ALT 15  ALKPHOS 100  BILITOT 0.6  PROT 7.5   No results for input(s): LIPASE, AMYLASE in the last 8760 hours. No results for input(s): AMMONIA in the last 8760 hours. CBC:  Recent Labs  06/03/14 0832 10/31/14 0920  WBC 6.3 5.2  NEUTROABS 2.8 2.4  HGB 15.5 15.6  HCT 47.5 45.5  MCV 96 92   Lipid Panel:  Recent Labs  06/03/14 0832 10/31/14 0920  CHOL 243* 214*  HDL 55 61  LDLCALC 150* 121*  TRIG 191* 158*  CHOLHDL 4.4 3.5   Lab Results  Component Value Date   HGBA1C 5.4 10/31/2014    Assessment/Plan 1. Essential hypertension, benign -bp at goal on recheck 132/84 -he checks it outside of here and it's been less than 140/90  2. Chronic kidney disease, stage 3 (moderate) -cont bp meds to prevent further decline -avoid nsaids -bp controlled on current agents  3. BPH (benign prostatic hyperplasia) -cont rapaflo and avoiding drinking within 2 hours of bedtime  4. Mixed hyperlipidemia -cont fenofibrate, f/u lipid panel next visit -discussed exercise with him  5. Stress -is anxious which brings up his blood pressure  Labs/tests ordered:  Bmp, lipid before Next appt:   mos  Traevion Poehler L. Zissy Hamlett, D.O. [facility] [address], [place] [address] Cell Phone (Mon-Fri 8am-5pm):  [phone] On Call:  [phone] & follow prompts after 5pm & weekends Office Phone:  [phone] Office Fax:  [phone]

## 2015-03-23 ENCOUNTER — Encounter: Payer: Self-pay | Admitting: Gastroenterology

## 2015-03-31 DIAGNOSIS — N401 Enlarged prostate with lower urinary tract symptoms: Secondary | ICD-10-CM | POA: Diagnosis not present

## 2015-03-31 DIAGNOSIS — R351 Nocturia: Secondary | ICD-10-CM | POA: Diagnosis not present

## 2015-03-31 DIAGNOSIS — N5201 Erectile dysfunction due to arterial insufficiency: Secondary | ICD-10-CM | POA: Diagnosis not present

## 2015-04-08 ENCOUNTER — Encounter (HOSPITAL_COMMUNITY): Payer: Self-pay

## 2015-04-08 ENCOUNTER — Emergency Department (HOSPITAL_COMMUNITY)
Admission: EM | Admit: 2015-04-08 | Discharge: 2015-04-08 | Disposition: A | Discharge status: Home / Self Care | Payer: Medicare Other | Attending: Emergency Medicine | Admitting: Emergency Medicine

## 2015-04-08 DIAGNOSIS — Z79899 Other long term (current) drug therapy: Secondary | ICD-10-CM | POA: Insufficient documentation

## 2015-04-08 DIAGNOSIS — L03312 Cellulitis of back [any part except buttock]: Secondary | ICD-10-CM | POA: Diagnosis not present

## 2015-04-08 DIAGNOSIS — Z7982 Long term (current) use of aspirin: Secondary | ICD-10-CM | POA: Diagnosis not present

## 2015-04-08 DIAGNOSIS — E780 Pure hypercholesterolemia: Secondary | ICD-10-CM | POA: Insufficient documentation

## 2015-04-08 DIAGNOSIS — N189 Chronic kidney disease, unspecified: Secondary | ICD-10-CM | POA: Insufficient documentation

## 2015-04-08 DIAGNOSIS — I129 Hypertensive chronic kidney disease with stage 1 through stage 4 chronic kidney disease, or unspecified chronic kidney disease: Secondary | ICD-10-CM | POA: Insufficient documentation

## 2015-04-08 DIAGNOSIS — M546 Pain in thoracic spine: Secondary | ICD-10-CM | POA: Diagnosis present

## 2015-04-08 MED ORDER — TRAMADOL HCL 50 MG PO TABS: 50.0000 mg | ORAL_TABLET | Freq: Four times a day (QID) | ORAL | Status: DC | PRN

## 2015-04-08 MED ORDER — DOXYCYCLINE HYCLATE 100 MG PO CAPS: 100.0000 mg | ORAL_CAPSULE | Freq: Two times a day (BID) | ORAL | Status: DC

## 2015-04-08 MED ORDER — TRAMADOL HCL 50 MG PO TABS
50.0000 mg | ORAL_TABLET | Freq: Once | ORAL | Status: AC
  Administered 2015-04-08: 50 mg via ORAL
  Filled 2015-04-08 (×2): qty 1

## 2015-04-08 MED ORDER — TRAMADOL HCL 50 MG PO TABS: 50.0000 mg | ORAL_TABLET | Freq: Once | ORAL | Status: DC

## 2015-04-08 NOTE — ED Notes (Signed)
Pt reports several days of pain and swelling on upper mid back.  No drainage noted.

## 2015-04-08 NOTE — ED Provider Notes (Signed)
History  This chart was scribed for non-physician practitioner, Hyman Bible, PA-C,working with Lacretia Leigh, MD, by Marlowe Kays, ED Scribe. This patient was seen in room TR11C/TR11C and the patient's care was started at 7:37 PM.  Chief Complaint  Patient presents with  . Abscess   The history is provided by the patient and medical records. No language interpreter was used.    HPI Comments:  Douglas Lowery is a 74 y.o. male who presents to the Emergency Department complaining of a red and painful abscess located in the middle of the upper thoracic back that appeared 2 days ago. He states he has been applying warm compresses to the area which has decreased the area in size. Laying on or touching the area makes the pain worse. He denies alleviating factors. He denies fever, chills, nausea, vomiting or drainage of the area.   No numbness or tingling.  He denies seeing an insect or feeling anything bite him. Denies h/o DM or h/o abscesses.  Past Medical History  Diagnosis Date  . Hypertension   . Sinus drainage   . Hypercholesteremia   . Allergy   . Chronic kidney disease    Past Surgical History  Procedure Laterality Date  . Eye surgery  2013    Right eye cataract removed--Dr. Katy Fitch  . Colonoscopy  02/2009    Sessile polyp in the rectum. Otherwise normal examination   Family History  Problem Relation Age of Onset  . Pneumonia Father   . Alzheimer's disease Sister   . Hypertension Sister   . Hypertension Sister   . Hypertension Sister    History  Substance Use Topics  . Smoking status: Never Smoker   . Smokeless tobacco: Never Used  . Alcohol Use: Yes     Comment: 40 oz beer every week    Review of Systems  Constitutional: Negative for fever and chills.  Gastrointestinal: Negative for nausea and vomiting.  Skin: Positive for color change (abscess to upper mid back).    Allergies  Review of patient's allergies indicates no known allergies.  Home Medications    Prior to Admission medications   Medication Sig Start Date End Date Taking? Authorizing Provider  amLODipine (NORVASC) 10 MG tablet TAKE 1 TABLET BY MOUTH EVERY DAY 08/29/14   Lauree Chandler, NP  aspirin 81 MG chewable tablet Chew 81 mg by mouth daily.    Historical Provider, MD  fenofibrate 54 MG tablet TAKE 1 TABLET (54 MG TOTAL) BY MOUTH DAILY. 06/16/14   Tiffany L Reed, DO  hydrALAZINE (APRESOLINE) 50 MG tablet TAKE 1 TABLET BY MOUTH 3 TIMES A DAY AS NEEDED FOR HIGH BLOOD PRESSUREW 08/10/13   Gerlene Fee, NP  pravastatin (PRAVACHOL) 80 MG tablet Take 1 tablet by mouth once daily for cholesterol 11/03/14   Tiffany L Reed, DO  silodosin (RAPAFLO) 8 MG CAPS capsule Take 8 mg by mouth daily with breakfast. For prostate 12/21/12   Lauree Chandler, NP   Triage Vitals: BP 163/91 mmHg  Pulse 85  Temp(Src) 97.4 F (36.3 C) (Oral)  Resp 22  Ht 5\' 7"  (1.702 m)  Wt 186 lb 8 oz (84.596 kg)  BMI 29.20 kg/m2  SpO2 95% Physical Exam  Constitutional: He is oriented to person, place, and time. He appears well-developed and well-nourished.  HENT:  Head: Normocephalic and atraumatic.  Eyes: EOM are normal.  Neck: Normal range of motion.  Cardiovascular: Normal rate, regular rhythm and normal heart sounds.   Pulmonary/Chest: Effort normal  and breath sounds normal. No respiratory distress.  Musculoskeletal: Normal range of motion. He exhibits tenderness.  Neurological: He is alert and oriented to person, place, and time.  Skin: Skin is warm and dry. There is erythema.  3 cm indurated, mildly erythematous area on mid upper back just right of thoracic spine.  No fluctuance.  Psychiatric: He has a normal mood and affect. His behavior is normal.  Nursing note and vitals reviewed.   ED Course  Procedures (including critical care time) DIAGNOSTIC STUDIES: Oxygen Saturation is 95% on RA, adequate by my interpretation.   COORDINATION OF CARE: 7:41 PM- Will ultrasound area. Pt verbalizes  understanding and agrees to plan.  Medications - No data to display  Labs Review Labs Reviewed - No data to display  Imaging Review No results found.   EKG Interpretation None     Patient discussed with Dr. Zenia Resides.   MDM   Final diagnoses:  None   Patient presents today with a tender indurated area of the back.  No fluctuance.  Bedside ultrasound used to evaluate the area and no fluid collection visualized.  Patient is afebrile.  No acute distress.  Patient started on antibiotics and instructed to follow up with PCP.  Stable for discharge.  Return precautions given.    I personally performed the services described in this documentation, which was scribed in my presence. The recorded information has been reviewed and is accurate.    Hyman Bible, PA-C 04/08/15 2138  Lacretia Leigh, MD 04/09/15 [phone]

## 2015-04-11 ENCOUNTER — Telehealth: Payer: Self-pay

## 2015-04-11 NOTE — Telephone Encounter (Signed)
Left message on voicemail for patient to return call when available. Patient was instructed in message that he is due for a follow-up from being seen in the ER. Patient to return call and schedule with Dr.Reed.

## 2015-05-30 DIAGNOSIS — H35352 Cystoid macular degeneration, left eye: Secondary | ICD-10-CM | POA: Diagnosis not present

## 2015-05-30 DIAGNOSIS — H35011 Changes in retinal vascular appearance, right eye: Secondary | ICD-10-CM | POA: Diagnosis not present

## 2015-05-30 LAB — HM DIABETES EYE EXAM

## 2015-07-03 ENCOUNTER — Other Ambulatory Visit: Payer: Medicare Other

## 2015-07-03 DIAGNOSIS — I10 Essential (primary) hypertension: Secondary | ICD-10-CM | POA: Diagnosis not present

## 2015-07-03 DIAGNOSIS — E782 Mixed hyperlipidemia: Secondary | ICD-10-CM | POA: Diagnosis not present

## 2015-07-04 LAB — BASIC METABOLIC PANEL
BUN/Creatinine Ratio: 13 (ref 10–22)
BUN: 16 mg/dL (ref 8–27)
CO2: 20 mmol/L (ref 18–29)
Calcium: 10.2 mg/dL (ref 8.6–10.2)
Chloride: 101 mmol/L (ref 97–108)
Creatinine, Ser: 1.27 mg/dL (ref 0.76–1.27)
GFR calc Af Amer: 64 mL/min/{1.73_m2} (ref >59)
GFR calc non Af Amer: 56 mL/min/{1.73_m2} — ABNORMAL LOW (ref >59)
Glucose: 101 mg/dL — ABNORMAL HIGH (ref 65–99)
Potassium: 3.9 mmol/L (ref 3.5–5.2)
Sodium: 140 mmol/L (ref 134–144)

## 2015-07-04 LAB — LIPID PANEL
Chol/HDL Ratio: 3.9 ratio units (ref 0.0–5.0)
Cholesterol, Total: 209 mg/dL — ABNORMAL HIGH (ref 100–199)
HDL: 54 mg/dL (ref >39)
LDL Calculated: 117 mg/dL — ABNORMAL HIGH (ref 0–99)
Triglycerides: 189 mg/dL — ABNORMAL HIGH (ref 0–149)
VLDL Cholesterol Cal: 38 mg/dL (ref 5–40)

## 2015-07-06 ENCOUNTER — Encounter: Payer: Self-pay | Admitting: Internal Medicine

## 2015-07-06 ENCOUNTER — Ambulatory Visit (INDEPENDENT_AMBULATORY_CARE_PROVIDER_SITE_OTHER): Payer: Medicare Other | Admitting: Internal Medicine

## 2015-07-06 VITALS — BP 148/96 | HR 90 | Temp 97.9°F | Resp 14 | Ht 67.0 in | Wt 186.0 lb

## 2015-07-06 DIAGNOSIS — Z23 Encounter for immunization: Secondary | ICD-10-CM | POA: Diagnosis not present

## 2015-07-06 DIAGNOSIS — N183 Chronic kidney disease, stage 3 unspecified: Secondary | ICD-10-CM

## 2015-07-06 DIAGNOSIS — R238 Other skin changes: Secondary | ICD-10-CM | POA: Diagnosis not present

## 2015-07-06 DIAGNOSIS — R350 Frequency of micturition: Secondary | ICD-10-CM

## 2015-07-06 DIAGNOSIS — I10 Essential (primary) hypertension: Secondary | ICD-10-CM

## 2015-07-06 DIAGNOSIS — Z7189 Other specified counseling: Secondary | ICD-10-CM | POA: Diagnosis not present

## 2015-07-06 DIAGNOSIS — E782 Mixed hyperlipidemia: Secondary | ICD-10-CM | POA: Diagnosis not present

## 2015-07-06 DIAGNOSIS — R239 Unspecified skin changes: Secondary | ICD-10-CM

## 2015-07-06 LAB — POCT URINALYSIS DIPSTICK
Bilirubin, UA: NEGATIVE
Blood, UA: NEGATIVE
Glucose, UA: NEGATIVE
Ketones, UA: NEGATIVE
Leukocytes, UA: NEGATIVE
Nitrite, UA: NEGATIVE
Spec Grav, UA: 1.01
Urobilinogen, UA: 0.2
pH, UA: 6.5

## 2015-07-06 MED ORDER — AMLODIPINE BESYLATE 10 MG PO TABS: 10.0000 mg | ORAL_TABLET | Freq: Every day | ORAL | Status: DC

## 2015-07-06 NOTE — Patient Instructions (Addendum)
Apply barrier cream to groin area for skin irritations caused by urine leaking. You can find this over the counter at the pharmacy- ask the pharmacist to help you find it.   Food Choices to Lower Your Triglycerides Triglycerides are a type of fat in your blood. High levels of triglycerides can increase the risk of heart disease and stroke. If your triglyceride levels are high, the foods you eat and your eating habits are very important. Choosing the right foods can help lower your triglycerides.  WHAT GENERAL GUIDELINES DO I NEED TO FOLLOW?  Lose weight if you are overweight.   Limit or avoid alcohol.   Fill one half of your plate with vegetables and green salads.   Limit fruit to two servings a day. Choose fruit instead of juice.   Make one fourth of your plate whole grains. Look for the word "whole" as the first word in the ingredient list.  Fill one fourth of your plate with lean protein foods.  Enjoy fatty fish (such as salmon, mackerel, sardines, and tuna) three times a week.   Choose healthy fats.   Limit foods high in starch and sugar.  Eat more home-cooked food and less restaurant, buffet, and fast food.  Limit fried foods.  Cook foods using methods other than frying.  Limit saturated fats.  Check ingredient lists to avoid foods with partially hydrogenated oils (trans fats) in them. WHAT FOODS CAN I EAT?  Grains Whole grains, such as whole wheat or whole grain breads, crackers, cereals, and pasta. Unsweetened oatmeal, bulgur, barley, quinoa, or brown rice. Corn or whole wheat flour tortillas.  Vegetables Fresh or frozen vegetables (raw, steamed, roasted, or grilled). Green salads. Fruits All fresh, canned (in natural juice), or frozen fruits. Meat and Other Protein Products Ground beef (85% or leaner), grass-fed beef, or beef trimmed of fat. Skinless chicken or [place]. Ground chicken or [place]. Pork trimmed of fat. All fish and seafood. Eggs. Dried beans, peas,  or lentils. Unsalted nuts or seeds. Unsalted canned or dry beans. Dairy Low-fat dairy products, such as skim or 1% milk, 2% or reduced-fat cheeses, low-fat ricotta or cottage cheese, or plain low-fat yogurt. Fats and Oils Tub margarines without trans fats. Light or reduced-fat mayonnaise and salad dressings. Avocado. Safflower, olive, or canola oils. Natural peanut or almond butter. The items listed above may not be a complete list of recommended foods or beverages. Contact your dietitian for more options. WHAT FOODS ARE NOT RECOMMENDED?  Grains White bread. White pasta. White rice. Cornbread. Bagels, pastries, and croissants. Crackers that contain trans fat. Vegetables White potatoes. Corn. Creamed or fried vegetables. Vegetables in a cheese sauce. Fruits Dried fruits. Canned fruit in light or heavy syrup. Fruit juice. Meat and Other Protein Products Fatty cuts of meat. Ribs, chicken wings, bacon, sausage, bologna, salami, chitterlings, fatback, hot dogs, bratwurst, and packaged luncheon meats. Dairy Whole or 2% milk, cream, half-and-half, and cream cheese. Whole-fat or sweetened yogurt. Full-fat cheeses. Nondairy creamers and whipped toppings. Processed cheese, cheese spreads, or cheese curds. Sweets and Desserts Corn syrup, sugars, honey, and molasses. Candy. Jam and jelly. Syrup. Sweetened cereals. Cookies, pies, cakes, donuts, muffins, and ice cream. Fats and Oils Butter, stick margarine, lard, shortening, ghee, or bacon fat. Coconut, palm kernel, or palm oils. Beverages Alcohol. Sweetened drinks (such as sodas, lemonade, and fruit drinks or punches). The items listed above may not be a complete list of foods and beverages to avoid. Contact your dietitian for more information.   This  information is not intended to replace advice given to you by your health care provider. Make sure you discuss any questions you have with your health care provider.   Document Released: 07/04/2004  Document Revised: 10/07/2014 Document Reviewed: 07/21/2013 Elsevier Interactive Patient Education [employer].

## 2015-07-06 NOTE — Progress Notes (Signed)
Location:  [facility]  Code Status:  Goals of Care: Advanced Directive information Does patient have an advance directive?: No, Would patient like information on creating an advanced directive?: Yes - Educational materials given       Chief Complaint  Patient presents with  . Medical Management of Chronic Issues    4 month follow-up, HTN, High Cholesterol, and prostate. Discuss labs (copy printed)  . Medication Management    Discuss medications: Patient not taking Norvasc, patient states he did not know he was suppose to be taking this medication.   . Immunizations    Flu vaccine today     HPI: Patient is a 74 y.o. male seen in the office today for medical management of chronic issues.   Pt returns for followup of chronic medical issues.  He has been doing great.   BP is a bit elevated today. He doesn't think he is taking the Norvasc. He gets exercise by working in the garage, he walks around his neighborhood every day.   He is urinating more frequently. Denies dysuria, hematuria. He is not taking Rapaflo everyday, only when he needs it. He is also noticing some urine leaking after he urinates, and feels this is causing a rash. This has been going on for a long period of time.  He uses a blue star ointment, but its not working to his satisfaction. He has seen Dr. Janice Norrie, last time was last year, but he was told Dr. Janice Norrie is retiring and he was to be assigned to another physician.   His triglycerides are elevated, he is eating a lot of candy at night, doesn't drink alcohol.  Stress level is well controlled, he had a pleasant trip to [place] for his granddaughter's graduation.  Review of Systems:  Review of Systems  Constitutional: Negative for fever, chills and malaise/fatigue.  Respiratory: Negative for cough and shortness of breath.   Cardiovascular: Negative for chest pain and palpitations.  Gastrointestinal: Negative for  heartburn, diarrhea and constipation.  Genitourinary: Positive for urgency and frequency. Negative for dysuria and hematuria.  Musculoskeletal: Negative for myalgias, back pain and joint pain.  Skin: Positive for rash.  Psychiatric/Behavioral: Negative for depression. The patient is not nervous/anxious.     Past Medical History  Diagnosis Date  . Hypertension   . Sinus drainage   . Hypercholesteremia   . Allergy   . Chronic kidney disease     Past Surgical History  Procedure Laterality Date  . Eye surgery  2013    Right eye cataract removed--Dr. Katy Fitch  . Colonoscopy  02/2009    Sessile polyp in the rectum. Otherwise normal examination    No Known Allergies Medications: Patient's Medications  New Prescriptions   No medications on file  Previous Medications   ASPIRIN 81 MG CHEWABLE TABLET    Chew 81 mg by mouth daily.   FENOFIBRATE 54 MG TABLET    TAKE 1 TABLET (54 MG TOTAL) BY MOUTH DAILY.   HYDRALAZINE (APRESOLINE) 50 MG TABLET    TAKE 1 TABLET BY MOUTH 3 TIMES A DAY AS NEEDED FOR HIGH BLOOD PRESSUREW   PRAVASTATIN (PRAVACHOL) 80 MG TABLET    Take 1 tablet by mouth once daily for cholesterol   SILODOSIN (RAPAFLO) 8 MG CAPS CAPSULE    Take 8 mg by mouth daily with breakfast. For prostate  Modified Medications   Modified Medication Previous Medication   AMLODIPINE (NORVASC) 10 MG TABLET amLODipine (NORVASC) 10 MG  tablet      Take 1 tablet (10 mg total) by mouth daily.    TAKE 1 TABLET BY MOUTH EVERY DAY  Discontinued Medications   DOXYCYCLINE (VIBRAMYCIN) 100 MG CAPSULE    Take 1 capsule (100 mg total) by mouth 2 (two) times daily.   TRAMADOL (ULTRAM) 50 MG TABLET    Take 1 tablet (50 mg total) by mouth every 6 (six) hours as needed.    Physical Exam: Filed Vitals:   07/06/15 1012  BP: 148/96  Pulse: 90  Temp: 97.9 F (36.6 C)  TempSrc: Oral  Resp: 14  Height: 5\' 7"  (1.702 m)  Weight: 186 lb (84.369 kg)  SpO2: 96%   Physical Exam  Constitutional: He is oriented  to person, place, and time. He appears well-developed and well-nourished. No distress.  Cardiovascular: Normal rate, regular rhythm, normal heart sounds and intact distal pulses.   Pulmonary/Chest: Effort normal and breath sounds normal. No respiratory distress. He has no rales.  Abdominal: Soft. Bowel sounds are normal. He exhibits no distension and no mass. There is no tenderness.  Musculoskeletal: Normal range of motion. He exhibits no edema.  Neurological: He is alert and oriented to person, place, and time.  Skin: Skin is warm and dry.  Mild irritation in bilateral groin area, no vesicles, no excoriations   Psychiatric: He has a normal mood and affect.    Labs reviewed: Urinalysis    Component Value Date/Time   COLORURINE YELLOW 08/16/2009 0813   APPEARANCEUR CLEAR 08/16/2009 0813   LABSPEC 1.021 08/16/2009 0813   PHURINE 7.5 08/16/2009 0813   GLUCOSEU NEGATIVE 08/16/2009 0813   HGBUR NEGATIVE 08/16/2009 0813   BILIRUBINUR Neg 07/06/2015 1107   BILIRUBINUR NEGATIVE 08/16/2009 0813   KETONESUR NEGATIVE 08/16/2009 0813   PROTEINUR Trace 07/06/2015 1107   PROTEINUR 30* 08/16/2009 0813   UROBILINOGEN 0.2 07/06/2015 1107   UROBILINOGEN 1.0 08/16/2009 0813   NITRITE Neg 07/06/2015 1107   NITRITE NEGATIVE 08/16/2009 0813   LEUKOCYTESUR Negative 07/06/2015 1107      Basic Metabolic Panel:  Recent Labs  10/31/14 0920 07/03/15 0814  NA 139 140  K 4.3 3.9  CL 102 101  CO2 19 20  GLUCOSE 92 101*  BUN 14 16  CREATININE 1.36* 1.27  CALCIUM 10.0 10.2   Liver Function Tests: No results for input(s): AST, ALT, ALKPHOS, BILITOT, PROT, ALBUMIN in the last 8760 hours. No results for input(s): LIPASE, AMYLASE in the last 8760 hours. No results for input(s): AMMONIA in the last 8760 hours. CBC:  Recent Labs  10/31/14 0920  WBC 5.2  NEUTROABS 2.4  HGB 15.6  HCT 45.5  MCV 92   Lipid Panel:  Recent Labs  10/31/14 0920 07/03/15 0814  CHOL 214* 209*  HDL 61 54    LDLCALC 121* 117*  TRIG 158* 189*  CHOLHDL 3.5 3.9   Lab Results  Component Value Date   HGBA1C 5.4 10/31/2014    Procedures since last visit:  Assessment/Plan 1. Essential hypertension, benign Elevated today. He is not taking amlodipine 10 mg daily.. Instructed to start amlodipine. Continue hydralazine 50 mg daily. - amLODipine (NORVASC) 10 MG tablet; Take 1 tablet (10 mg total) by mouth daily.  Dispense: 30 tablet; Refill: 5 - Basic metabolic panel; Future  2. Urinary frequency UA is negative. He has a history of BPH. Instructed him to take his Rapaflo every day.  Has seen Dr. Janice Norrie in the past, but doesn't currently have follow up with urology scheduled.  -  POCT urinalysis dipstick - Ambulatory referral to Urology  3. Mixed hyperlipidemia Continue pravastatin 80 mg daily. Continue exercise and diet changes. - Hemoglobin A1c; Future - Lipid panel; Future  4. Elevated triglycerides with high cholesterol A little elevated still. Continue Fenofibrate 54 mg daily. Written instructions given regarding food choices. Continue daily exercise.  - Hemoglobin A1c; Future  5. Chronic kidney disease, stage 3 Stable. Discussed importance of good BP control to limit progression. Continue diet and exercise.   6. Alteration in skin integrity due to moisture Resulting from urine leaking. Apply barrier cream as needed.   7. Need for influenza vaccination Vaccine received today in clinic. - Flu Vaccine QUAD 36+ mos PF IM (Fluarix & Fluzone Quad PF)  8. Advanced directives, counseling/discussion- He would like to remain full code. Gave him Living Will/Advanced Directives paperwork and he intends to complete this and bring a copy to his next appointment.   Next appt: 3 months for medical management with labs with Dr. Mariea Clonts.   Mariana Kaufman, RN, BSN Student- Nurse Practitioner- [facility] [address], [place] [address] Cell Phone  (Mon-Fri 8am-5pm):  [phone] On Call:  [phone] & follow prompts after 5pm & weekends Office Phone:  [phone] Office Fax:  [phone]

## 2015-07-28 DIAGNOSIS — R351 Nocturia: Secondary | ICD-10-CM | POA: Diagnosis not present

## 2015-07-28 DIAGNOSIS — N401 Enlarged prostate with lower urinary tract symptoms: Secondary | ICD-10-CM | POA: Diagnosis not present

## 2015-07-28 DIAGNOSIS — N3281 Overactive bladder: Secondary | ICD-10-CM | POA: Diagnosis not present

## 2015-07-28 DIAGNOSIS — N5201 Erectile dysfunction due to arterial insufficiency: Secondary | ICD-10-CM | POA: Diagnosis not present

## 2015-08-18 DIAGNOSIS — J3089 Other allergic rhinitis: Secondary | ICD-10-CM | POA: Diagnosis not present

## 2015-08-18 DIAGNOSIS — J343 Hypertrophy of nasal turbinates: Secondary | ICD-10-CM | POA: Diagnosis not present

## 2015-09-13 ENCOUNTER — Other Ambulatory Visit: Payer: Self-pay | Admitting: Internal Medicine

## 2015-09-18 ENCOUNTER — Other Ambulatory Visit: Payer: Self-pay | Admitting: Adult Health

## 2015-09-19 NOTE — Telephone Encounter (Signed)
Spoke with patient, patient states he has taken this medication routinely. Patient aware based our records medication was last filled 2014. Patient states his current pill bottle has Dr.Reed's name on it so he is not sure why our system does not show it as being filled recently.

## 2015-10-03 ENCOUNTER — Other Ambulatory Visit: Payer: Medicare Other

## 2015-10-03 ENCOUNTER — Other Ambulatory Visit: Payer: Self-pay | Admitting: Internal Medicine

## 2015-10-03 DIAGNOSIS — N3281 Overactive bladder: Secondary | ICD-10-CM | POA: Diagnosis not present

## 2015-10-03 DIAGNOSIS — R351 Nocturia: Secondary | ICD-10-CM | POA: Diagnosis not present

## 2015-10-03 DIAGNOSIS — N401 Enlarged prostate with lower urinary tract symptoms: Secondary | ICD-10-CM | POA: Diagnosis not present

## 2015-10-04 ENCOUNTER — Other Ambulatory Visit: Payer: Medicare Other

## 2015-10-04 DIAGNOSIS — E782 Mixed hyperlipidemia: Secondary | ICD-10-CM | POA: Diagnosis not present

## 2015-10-04 DIAGNOSIS — J343 Hypertrophy of nasal turbinates: Secondary | ICD-10-CM | POA: Diagnosis not present

## 2015-10-04 DIAGNOSIS — I10 Essential (primary) hypertension: Secondary | ICD-10-CM | POA: Diagnosis not present

## 2015-10-04 DIAGNOSIS — J3089 Other allergic rhinitis: Secondary | ICD-10-CM | POA: Diagnosis not present

## 2015-10-05 ENCOUNTER — Ambulatory Visit (INDEPENDENT_AMBULATORY_CARE_PROVIDER_SITE_OTHER): Payer: Medicare Other | Admitting: Internal Medicine

## 2015-10-05 ENCOUNTER — Encounter: Payer: Self-pay | Admitting: Internal Medicine

## 2015-10-05 VITALS — BP 160/98 | HR 99 | Temp 97.5°F | Resp 20 | Ht 67.0 in | Wt 182.6 lb

## 2015-10-05 DIAGNOSIS — N183 Chronic kidney disease, stage 3 unspecified: Secondary | ICD-10-CM

## 2015-10-05 DIAGNOSIS — D3192 Benign neoplasm of unspecified part of left eye: Secondary | ICD-10-CM

## 2015-10-05 DIAGNOSIS — N400 Enlarged prostate without lower urinary tract symptoms: Secondary | ICD-10-CM

## 2015-10-05 DIAGNOSIS — E782 Mixed hyperlipidemia: Secondary | ICD-10-CM

## 2015-10-05 DIAGNOSIS — I10 Essential (primary) hypertension: Secondary | ICD-10-CM

## 2015-10-05 DIAGNOSIS — J320 Chronic maxillary sinusitis: Secondary | ICD-10-CM

## 2015-10-05 LAB — LIPID PANEL
Chol/HDL Ratio: 3 ratio units (ref 0.0–5.0)
Cholesterol, Total: 191 mg/dL (ref 100–199)
HDL: 64 mg/dL (ref >39)
LDL Calculated: 102 mg/dL — ABNORMAL HIGH (ref 0–99)
Triglycerides: 124 mg/dL (ref 0–149)
VLDL Cholesterol Cal: 25 mg/dL (ref 5–40)

## 2015-10-05 LAB — BASIC METABOLIC PANEL
BUN/Creatinine Ratio: 13 (ref 10–22)
BUN: 17 mg/dL (ref 8–27)
CO2: 21 mmol/L (ref 18–29)
Calcium: 10 mg/dL (ref 8.6–10.2)
Chloride: 101 mmol/L (ref 96–106)
Creatinine, Ser: 1.33 mg/dL — ABNORMAL HIGH (ref 0.76–1.27)
GFR calc Af Amer: 60 mL/min/{1.73_m2} (ref >59)
GFR calc non Af Amer: 52 mL/min/{1.73_m2} — ABNORMAL LOW (ref >59)
Glucose: 105 mg/dL — ABNORMAL HIGH (ref 65–99)
Potassium: 4.2 mmol/L (ref 3.5–5.2)
Sodium: 140 mmol/L (ref 134–144)

## 2015-10-05 LAB — HEMOGLOBIN A1C
Est. average glucose Bld gHb Est-mCnc: 108 mg/dL
Hgb A1c MFr Bld: 5.4 % (ref 4.8–5.6)

## 2015-10-05 MED ORDER — CLONIDINE HCL 0.1 MG PO TABS
0.2000 mg | ORAL_TABLET | Freq: Once | ORAL | Status: AC
  Administered 2015-10-05: 0.2 mg via ORAL

## 2015-10-05 NOTE — Patient Instructions (Signed)
Coricidin BP is ok to use as cough syrup when you have high blood pressure.

## 2015-10-05 NOTE — Progress Notes (Signed)
Patient ID: Douglas Lowery, male   DOB: Oct 27, 1940, 75 y.o.   MRN: [id number]   Location: [place] Provider: Rexene Edison. Mariea Clonts, D.O., C.M.D.  Code Status: DNR Goals of Care: Advanced Directive information Does patient have an advance directive?: Yes  Chief Complaint  Patient presents with  . Medical Management of Chronic Issues    3 month follow-up for medical management     HPI: Patient is a 75 y.o. male seen in the office today for med mgt of chronic diseases.    Unclear why urgent care PCP was listed from 10/05/15 as his PCP when he's been coming here for years.    BP also very high.  Got upset about being asked about his insurance cards--needed it changed and got it updated.  Reports the lady that asked him was not nice to him.    Did take his medications this morning.  Is taking medication for a cold.  Has had inflamed turbinates.  Was advised by ENT to use nasonex.  Has used cough syrup--robitussin D.    Left eye red, drainage and has growth on his eye.  Notes it has grown in size.  Had asked Dr Zadie Rhine about it--he gave him drops for it.  Also sees Dr. Katy Fitch just about his vision.  He did have an injury to the left eye before, he reports.  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    Urology saw him and he is now on rapaflo and vesicare.  BP was very high there also.  179/80 he thinks.  Was already taking cough medicine then.    Hyperlipidemia:  Is trying to eat more greens, less fried, starchy foods.    Rash resolved from the moisture when he was having leakage.    Review of Systems:  Review of Systems  Constitutional: Negative for fever and chills.  HENT: Positive for congestion. Negative for ear pain, nosebleeds and sore throat.   Eyes: Positive for redness. Negative for blurred vision.       Irregular shaped, raised yellowish white area on upper outer sclera  Respiratory: Negative for shortness of breath.   Cardiovascular: Negative for chest pain and leg  swelling.  Gastrointestinal: Negative for abdominal pain.  Genitourinary: Negative for dysuria.       Leakage and stream improved  Musculoskeletal: Negative for falls.  Skin: Negative for rash.  Neurological: Negative for dizziness and loss of consciousness.  Endo/Heme/Allergies: Does not bruise/bleed easily.  Psychiatric/Behavioral: Negative for memory loss.    Past Medical History  Diagnosis Date  . Hypertension   . Sinus drainage   . Hypercholesteremia   . Allergy   . Chronic kidney disease     Past Surgical History  Procedure Laterality Date  . Eye surgery  2013    Right eye cataract removed--Dr. Katy Fitch  . Colonoscopy  02/2009    Sessile polyp in the rectum. Otherwise normal examination    No Known Allergies    Medication List       This list is accurate as of: 10/05/15  1:38 PM.  Always use your most recent med list.               amLODipine 10 MG tablet  Commonly known as:  NORVASC  Take 1 tablet (10 mg total) by mouth daily.     aspirin 81 MG chewable tablet  Chew 81 mg by mouth daily.     fenofibrate 54 MG tablet  TAKE  1 TABLET (54 MG TOTAL) BY MOUTH DAILY.     hydrALAZINE 50 MG tablet  Commonly known as:  APRESOLINE  TAKE 1 TABLET BY MOUTH 3 TIMES A DAY AS NEEDED FOR HIGH BLOOD PRESSUREW     mometasone 50 MCG/ACT nasal spray  Commonly known as:  NASONEX  Place 2 sprays into the nose daily.     pravastatin 40 MG tablet  Commonly known as:  PRAVACHOL  TAKE 1 TABLET EVERY DAY FOR CHOLESTEROL     silodosin 8 MG Caps capsule  Commonly known as:  RAPAFLO  Take 8 mg by mouth daily with breakfast. For prostate     solifenacin 5 MG tablet  Commonly known as:  VESICARE  Take 5 mg by mouth daily.        Health Maintenance  Topic Date Due  . TETANUS/TDAP  09/03/1960  . INFLUENZA VACCINE  04/30/2016  . COLONOSCOPY  03/30/2019  . ZOSTAVAX  Completed  . PNA vac Low Risk Adult  Completed    Physical Exam: Filed Vitals:   10/05/15 1314 10/05/15  1333  BP: 200/100 210/100  Pulse: 99   Temp: 97.5 F (36.4 C)   TempSrc: Oral   Resp: 20   Height: 5\' 7"  (1.702 m)   Weight: 182 lb 9.6 oz (82.827 kg)   SpO2: 97%    Body mass index is 28.59 kg/(m^2). Physical Exam  Constitutional: He is oriented to person, place, and time. He appears well-developed and well-nourished. No distress.  HENT:  Maxillary sinus tenderness  Eyes:  Left eye (see ROS), erythema of bilateral conjunctivae  Neck: Neck supple.  Cardiovascular: Regular rhythm and normal heart sounds.   tachy  Pulmonary/Chest: Effort normal and breath sounds normal. No respiratory distress.  Musculoskeletal: Normal range of motion.  Neurological: He is alert and oriented to person, place, and time.  Skin: Skin is warm and dry.  Psychiatric: He has a normal mood and affect.    Labs reviewed: Basic Metabolic Panel:  Recent Labs  10/31/14 0920 07/03/15 0814 10/04/15 0950  NA 139 140 140  K 4.3 3.9 4.2  CL 102 101 101  CO2 19 20 21   GLUCOSE 92 101* 105*  BUN 14 16 17   CREATININE 1.36* 1.27 1.33*  CALCIUM 10.0 10.2 10.0   Liver Function Tests: No results for input(s): AST, ALT, ALKPHOS, BILITOT, PROT, ALBUMIN in the last 8760 hours. No results for input(s): LIPASE, AMYLASE in the last 8760 hours. No results for input(s): AMMONIA in the last 8760 hours. CBC:  Recent Labs  10/31/14 0920  WBC 5.2  NEUTROABS 2.4  HGB 15.6  HCT 45.5  MCV 92   Lipid Panel:  Recent Labs  10/31/14 0920 07/03/15 0814 10/04/15 0950  CHOL 214* 209* 191  HDL 61 54 64  LDLCALC 121* 117* 102*  TRIG 158* 189* 124  CHOLHDL 3.5 3.9 3.0   Lab Results  Component Value Date   HGBA1C 5.4 10/04/2015   Assessment/Plan 1. Chronic maxillary sinusitis -cont the nasonex per ENT, cont to hydrate, warm humidity -discontinue use of robitussin DM and use coricidin bp   2. 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 Corneal ulcer -I'm  not sure, but eyes are both red which also worries me in context of his high bp and tachycardia  3. Essential hypertension -very high today after cough syrup, getting angry with receptionist about insurance cards -given clonidine 0.2mg  and waiting for bp to  come down -is going to return next week off of DM cough syrup and hopefully w/o a stressful visit  -if still up in 1 wk, will increase his hydralazine to 100mg  po tid from 50mg   4. Mixed hyperlipidemia -lipids gradually improving with pravachol and diet changes, has been exercising already  5. Chronic kidney disease, stage 3 -renal function stable  6. BPH (benign prostatic hyperplasia) -cont rapaflo and vesicare as per urology--has had improvement and rash also has resolved with use of cream I previously prescribed  Labs/tests ordered:  No new; will reassess at next routine visit Next appt:  1 week f/u on blood pressure  Lacorey Brusca L. Wiletta Bermingham, D.O. [facility] [address], [place] [address] Cell Phone (Mon-Fri 8am-5pm):  [phone] On Call:  [phone] & follow prompts after 5pm & weekends Office Phone:  [phone] Office Fax:  [phone]

## 2015-10-11 ENCOUNTER — Encounter: Payer: Self-pay | Admitting: *Deleted

## 2015-10-12 ENCOUNTER — Ambulatory Visit (INDEPENDENT_AMBULATORY_CARE_PROVIDER_SITE_OTHER): Payer: Medicare Other | Admitting: Internal Medicine

## 2015-10-12 ENCOUNTER — Encounter: Payer: Self-pay | Admitting: Internal Medicine

## 2015-10-12 VITALS — BP 190/92 | HR 80 | Temp 97.9°F | Ht 67.0 in | Wt 181.0 lb

## 2015-10-12 DIAGNOSIS — D3192 Benign neoplasm of unspecified part of left eye: Secondary | ICD-10-CM | POA: Diagnosis not present

## 2015-10-12 DIAGNOSIS — J320 Chronic maxillary sinusitis: Secondary | ICD-10-CM

## 2015-10-12 DIAGNOSIS — I10 Essential (primary) hypertension: Secondary | ICD-10-CM | POA: Diagnosis not present

## 2015-10-12 MED ORDER — HYDRALAZINE HCL 100 MG PO TABS: 100.0000 mg | ORAL_TABLET | Freq: Three times a day (TID) | ORAL | Status: DC

## 2015-10-12 NOTE — Patient Instructions (Signed)
Increase your hydralazine from 50mg  three times a day to 100mg  three times a day. For now, use up your hydralazine 50mg  pills--take two of them three times a day until they are gone, then use the new 100mg  pills three times a day. Continue the amlodipine 10mg  daily. Continue all of your other medications as directed. Follow a low sodium diet.  DASH Eating Plan DASH stands for "Dietary Approaches to Stop Hypertension." The DASH eating plan is a healthy eating plan that has been shown to reduce high blood pressure (hypertension). Additional health benefits may include reducing the risk of type 2 diabetes mellitus, heart disease, and stroke. The DASH eating plan may also help with weight loss. WHAT DO I NEED TO KNOW ABOUT THE DASH EATING PLAN? For the DASH eating plan, you will follow these general guidelines:  Choose foods with a percent daily value for sodium of less than 5% (as listed on the food label).  Use salt-free seasonings or herbs instead of table salt or sea salt.  Check with your health care provider or pharmacist before using salt substitutes.  Eat lower-sodium products, often labeled as "lower sodium" or "no salt added."  Eat fresh foods.  Eat more vegetables, fruits, and low-fat dairy products.  Choose whole grains. Look for the word "whole" as the first word in the ingredient list.  Choose fish and skinless chicken or [place] more often than red meat. Limit fish, poultry, and meat to 6 oz (170 g) each day.  Limit sweets, desserts, sugars, and sugary drinks.  Choose heart-healthy fats.  Limit cheese to 1 oz (28 g) per day.  Eat more home-cooked food and less restaurant, buffet, and fast food.  Limit fried foods.  Cook foods using methods other than frying.  Limit canned vegetables. If you do use them, rinse them well to decrease the sodium.  When eating at a restaurant, ask that your food be prepared with less salt, or no salt if possible. WHAT FOODS CAN I  EAT? Seek help from a dietitian for individual calorie needs. Grains Whole grain or whole wheat bread. Brown rice. Whole grain or whole wheat pasta. Quinoa, bulgur, and whole grain cereals. Low-sodium cereals. Corn or whole wheat flour tortillas. Whole grain cornbread. Whole grain crackers. Low-sodium crackers. Vegetables Fresh or frozen vegetables (raw, steamed, roasted, or grilled). Low-sodium or reduced-sodium tomato and vegetable juices. Low-sodium or reduced-sodium tomato sauce and paste. Low-sodium or reduced-sodium canned vegetables.  Fruits All fresh, canned (in natural juice), or frozen fruits. Meat and Other Protein Products Ground beef (85% or leaner), grass-fed beef, or beef trimmed of fat. Skinless chicken or [place]. Ground chicken or [place]. Pork trimmed of fat. All fish and seafood. Eggs. Dried beans, peas, or lentils. Unsalted nuts and seeds. Unsalted canned beans. Dairy Low-fat dairy products, such as skim or 1% milk, 2% or reduced-fat cheeses, low-fat ricotta or cottage cheese, or plain low-fat yogurt. Low-sodium or reduced-sodium cheeses. Fats and Oils Tub margarines without trans fats. Light or reduced-fat mayonnaise and salad dressings (reduced sodium). Avocado. Safflower, olive, or canola oils. Natural peanut or almond butter. Other Unsalted popcorn and pretzels. The items listed above may not be a complete list of recommended foods or beverages. Contact your dietitian for more options. WHAT FOODS ARE NOT RECOMMENDED? Grains White bread. White pasta. White rice. Refined cornbread. Bagels and croissants. Crackers that contain trans fat. Vegetables Creamed or fried vegetables. Vegetables in a cheese sauce. Regular canned vegetables. Regular canned tomato sauce and paste. Regular tomato  and vegetable juices. Fruits Dried fruits. Canned fruit in light or heavy syrup. Fruit juice. Meat and Other Protein Products Fatty cuts of meat. Ribs, chicken wings, bacon, sausage,  bologna, salami, chitterlings, fatback, hot dogs, bratwurst, and packaged luncheon meats. Salted nuts and seeds. Canned beans with salt. Dairy Whole or 2% milk, cream, half-and-half, and cream cheese. Whole-fat or sweetened yogurt. Full-fat cheeses or blue cheese. Nondairy creamers and whipped toppings. Processed cheese, cheese spreads, or cheese curds. Condiments Onion and garlic salt, seasoned salt, table salt, and sea salt. Canned and packaged gravies. Worcestershire sauce. Tartar sauce. Barbecue sauce. Teriyaki sauce. Soy sauce, including reduced sodium. Steak sauce. Fish sauce. Oyster sauce. Cocktail sauce. Horseradish. Ketchup and mustard. Meat flavorings and tenderizers. Bouillon cubes. Hot sauce. Tabasco sauce. Marinades. Taco seasonings. Relishes. Fats and Oils Butter, stick margarine, lard, shortening, ghee, and bacon fat. Coconut, palm kernel, or palm oils. Regular salad dressings. Other Pickles and olives. Salted popcorn and pretzels. The items listed above may not be a complete list of foods and beverages to avoid. Contact your dietitian for more information. WHERE CAN I FIND MORE INFORMATION? National Heart, Lung, and Blood Institute: travelstabloid.com   This information is not intended to replace advice given to you by your health care provider. Make sure you discuss any questions you have with your health care provider.   Document Released: 09/05/2011 Document Revised: 10/07/2014 Document Reviewed: 07/21/2013 Elsevier Interactive Patient Education [employer].

## 2015-10-12 NOTE — Progress Notes (Signed)
Patient ID: Douglas Lowery, male   DOB: July 01, 1941, 75 y.o.   MRN: [id number]   Location: [place] Provider: Rexene Edison. Mariea Clonts, D.O., C.M.D.  Goals of Care: Advanced Directive information Does patient have an advance directive?: No  Chief Complaint  Patient presents with  . Medical Management of Chronic Issues    one week follow-up blood pressure and sinuses    HPI: Patient is a 75 y.o. male seen in the office today for f/u on bp and sinuses.  Sinus infection better.  No more congestion, very little cough.  Still wondering what the spot is on his left eye, but didn't get Dr. Dahlia Bailiff records.  Turns out the wrong Dr. Zadie Rhine was selected previously so will try again.  Left eye area still there and that eye stays red more  BP still elevated.  Agrees to increase in medication.  Increased further when I rechecked it.  [facility] 140[facility]90 he reports  Review of Systems:  Review of Systems  Constitutional: Negative for fever, chills and malaise/fatigue.  HENT: Negative for congestion and hearing loss.   Eyes: Positive for redness. Negative for blurred vision.       And left surface lesion  Respiratory: Positive for cough. Negative for sputum production and shortness of breath.   Cardiovascular: Negative for chest pain.  Skin: Negative for rash.  Neurological: Negative for headaches.    Past Medical History  Diagnosis Date  . Hypertension   . Sinus drainage   . Hypercholesteremia   . Allergy   . Chronic kidney disease     Past Surgical History  Procedure Laterality Date  . Eye surgery  2013    Right eye cataract removed--Dr. Katy Fitch  . Colonoscopy  02/2009    Sessile polyp in the rectum. Otherwise normal examination    No Known Allergies    Medication List       This list is accurate as of: 10/12/15  4:12 PM.  Always use your most recent med list.               amLODipine 10 MG tablet  Commonly known as:  NORVASC  Take 1 tablet (10 mg  total) by mouth daily.     aspirin 81 MG chewable tablet  Chew 81 mg by mouth daily.     fenofibrate 54 MG tablet  TAKE 1 TABLET (54 MG TOTAL) BY MOUTH DAILY.     hydrALAZINE 50 MG tablet  Commonly known as:  APRESOLINE  TAKE 1 TABLET BY MOUTH 3 TIMES A DAY AS NEEDED FOR HIGH BLOOD PRESSUREW     mometasone 50 MCG/ACT nasal spray  Commonly known as:  NASONEX  Place 2 sprays into the nose daily.     pravastatin 40 MG tablet  Commonly known as:  PRAVACHOL  TAKE 1 TABLET EVERY DAY FOR CHOLESTEROL     silodosin 8 MG Caps capsule  Commonly known as:  RAPAFLO  Take 8 mg by mouth daily with breakfast. For prostate     solifenacin 5 MG tablet  Commonly known as:  VESICARE  Take 5 mg by mouth daily.        Health Maintenance  Topic Date Due  . TETANUS/TDAP  09/03/1960  . INFLUENZA VACCINE  04/30/2016  . COLONOSCOPY  03/30/2019  . ZOSTAVAX  Completed  . PNA vac Low Risk Adult  Completed    Physical Exam: Filed Vitals:   10/12/15 1533  BP: 160/86  Pulse: 80  Temp: 97.9 F (36.6 C)  TempSrc: Oral  Height: 5\' 7"  (1.702 m)  Weight: 181 lb (82.101 kg)  SpO2: 95%   Body mass index is 28.34 kg/(m^2). Physical Exam  Constitutional: He is oriented to person, place, and time. He appears well-developed and well-nourished. No distress.  Eyes: EOM are normal. Pupils are equal, round, and reactive to light.  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 No JVD present.  Cardiovascular: Normal rate, regular rhythm, normal heart sounds and intact distal pulses.   Pulmonary/Chest: Effort normal and breath sounds normal. No respiratory distress.  Neurological: He is alert and oriented to person, place, and time. No cranial nerve deficit.  Skin: Skin is warm and dry.  Psychiatric: He has a normal mood and affect.    Labs reviewed: Basic Metabolic Panel:  Recent Labs  10/31/14 0920 07/03/15 0814 10/04/15 0950  NA  139 140 140  K 4.3 3.9 4.2  CL 102 101 101  CO2 19 20 21   GLUCOSE 92 101* 105*  BUN 14 16 17   CREATININE 1.36* 1.27 1.33*  CALCIUM 10.0 10.2 10.0   Liver Function Tests: No results for input(s): AST, ALT, ALKPHOS, BILITOT, PROT, ALBUMIN in the last 8760 hours. No results for input(s): LIPASE, AMYLASE in the last 8760 hours. No results for input(s): AMMONIA in the last 8760 hours. CBC:  Recent Labs  10/31/14 0920  WBC 5.2  NEUTROABS 2.4  HGB 15.6  HCT 45.5  MCV 92   Lipid Panel:  Recent Labs  10/31/14 0920 07/03/15 0814 10/04/15 0950  CHOL 214* 209* 191  HDL 61 54 64  LDLCALC 121* 117* 102*  TRIG 158* 189* 124  CHOLHDL 3.5 3.9 3.0   Lab Results  Component Value Date   HGBA1C 5.4 10/04/2015    Assessment/Plan 1. Uncontrolled hypertension -cont amlodipine 10mg  daily - increase hydrALAZINE (APRESOLINE) to 100 MG tablet; Take 1 tablet (100 mg total) by mouth 3 (three) times daily.  Dispense: 90 tablet; Refill: 3 -cont prostate meds  2. Chronic maxillary sinusitis -seems acute infection resolved  3. Benign growth on outer layer of left eye -await records from Dr. Zadie Rhine, but I think it's a pterygium  Labs/tests ordered:  No new Next appt:  1 wk f/u bp  Nilam Quakenbush L. Guerin Lashomb, D.O. [facility] [address], [place] [address] Cell Phone (Mon-Fri 8am-5pm):  [phone] On Call:  [phone] & follow prompts after 5pm & weekends Office Phone:  [phone] Office Fax:  [phone]

## 2015-10-20 ENCOUNTER — Encounter: Payer: Self-pay | Admitting: Internal Medicine

## 2015-10-20 ENCOUNTER — Encounter: Payer: Self-pay | Admitting: Ophthalmology

## 2015-10-26 ENCOUNTER — Ambulatory Visit (INDEPENDENT_AMBULATORY_CARE_PROVIDER_SITE_OTHER): Payer: Medicare Other | Admitting: Internal Medicine

## 2015-10-26 ENCOUNTER — Encounter: Payer: Self-pay | Admitting: Internal Medicine

## 2015-10-26 VITALS — BP 192/98 | HR 102 | Temp 97.9°F | Resp 20 | Ht 67.0 in | Wt 180.8 lb

## 2015-10-26 DIAGNOSIS — H11002 Unspecified pterygium of left eye: Secondary | ICD-10-CM

## 2015-10-26 DIAGNOSIS — I10 Essential (primary) hypertension: Secondary | ICD-10-CM | POA: Diagnosis not present

## 2015-10-26 MED ORDER — CLONIDINE HCL 0.1 MG PO TABS
0.1000 mg | ORAL_TABLET | Freq: Every day | ORAL | Status: DC
  Administered 2015-10-26: 0.1 mg via ORAL

## 2015-10-26 NOTE — Progress Notes (Signed)
Patient ID: Douglas Lowery, male   DOB: [birth year], 75 y.o.   MRN: [id number]   Location: [place] Provider: Rexene Edison. Mariea Clonts, D.O., C.M.D.  Goals of Care: Advanced Directive information Does patient have an advance directive?: No, Does patient want to make changes to advanced directive?: Yes - information given  Chief Complaint  Patient presents with  . Medical Management of Chronic Issues    Medical Management of Chronic Issues. 2 week BP follow up  . OTHER    Labs printed    HPI: Patient is a 75 y.o. male seen in the office today for blood pressure f/u.  bp is higher instead of lower today.  Was rechecked by second CMA and still sky high.  Also tachycardic today.  No headaches, no chest pain, no shortness of breath.  No changes in vision.  Is active.  Eating healthier. Has been avoiding high sodium foods and eating more vegetables.  [facility] and has been [facility] more.  [facility] in 140s typically.  Has had two doses of 100mg  hydralazine and his 10mg  of amlodipine today plus prostate meds and bp still sky high today.  Has only eaten cereal and drank 1/2 glass of water so far.  Labs reviewed with pt.  Review of Systems:  Review of Systems  Constitutional: Negative for fever and chills.  Eyes:       Pterygium on eye  Respiratory: Negative for shortness of breath.   Cardiovascular: Negative for chest pain and leg swelling.  Gastrointestinal: Negative for abdominal pain.  Genitourinary: Negative for dysuria.  Musculoskeletal: Negative for falls.  Neurological: Negative for dizziness, loss of consciousness and headaches.  Endo/Heme/Allergies: Does not bruise/bleed easily.  Psychiatric/Behavioral: Negative for memory loss.    Past Medical History  Diagnosis Date  . Hypertension   . Sinus drainage   . Hypercholesteremia   . Allergy   . Chronic kidney disease     Past Surgical History  Procedure Laterality Date  . Eye surgery  2013    Right eye  cataract removed--Dr. Katy Fitch  . Colonoscopy  02/2009    Sessile polyp in the rectum. Otherwise normal examination    No Known Allergies    Medication List       This list is accurate as of: 10/26/15  1:30 PM.  Always use your most recent med list.               amLODipine 10 MG tablet  Commonly known as:  NORVASC  Take 1 tablet (10 mg total) by mouth daily.     aspirin 81 MG chewable tablet  Chew 81 mg by mouth daily.     fenofibrate 54 MG tablet  TAKE 1 TABLET (54 MG TOTAL) BY MOUTH DAILY.     hydrALAZINE 100 MG tablet  Commonly known as:  APRESOLINE  Take 1 tablet (100 mg total) by mouth 3 (three) times daily.     mometasone 50 MCG/ACT nasal spray  Commonly known as:  NASONEX  Place 2 sprays into the nose daily.     pravastatin 40 MG tablet  Commonly known as:  PRAVACHOL  TAKE 1 TABLET EVERY DAY FOR CHOLESTEROL     silodosin 8 MG Caps capsule  Commonly known as:  RAPAFLO  Take 8 mg by mouth daily with breakfast. For prostate     solifenacin 5 MG tablet  Commonly known as:  VESICARE  Take 5 mg by mouth daily.  Health Maintenance  Topic Date Due  . TETANUS/TDAP  09/03/1960  . INFLUENZA VACCINE  04/30/2016  . COLONOSCOPY  03/30/2019  . ZOSTAVAX  Completed  . PNA vac Low Risk Adult  Completed    Physical Exam: Filed Vitals:   10/26/15 1314  BP: 192/98  Pulse: 102  Temp: 97.9 F (36.6 C)  TempSrc: Oral  Resp: 20  Height: 5\' 7"  (1.702 m)  Weight: 180 lb 12.8 oz (82.01 kg)  SpO2: 97%   Body mass index is 28.31 kg/(m^2). Physical Exam  Constitutional: He is oriented to person, place, and time. He appears well-developed and well-nourished. No distress.  Cardiovascular: Normal rate, regular rhythm, normal heart sounds and intact distal pulses.   Pulmonary/Chest: Effort normal and breath sounds normal. No respiratory distress.  Musculoskeletal: Normal range of motion.  Neurological: He is alert and oriented to person, place, and time.  Skin:  Skin is warm and dry.    Labs reviewed: Basic Metabolic Panel:  Recent Labs  10/31/14 0920 07/03/15 0814 10/04/15 0950  NA 139 140 140  K 4.3 3.9 4.2  CL 102 101 101  CO2 19 20 21   GLUCOSE 92 101* 105*  BUN 14 16 17   CREATININE 1.36* 1.27 1.33*  CALCIUM 10.0 10.2 10.0   Liver Function Tests: No results for input(s): AST, ALT, ALKPHOS, BILITOT, PROT, ALBUMIN in the last 8760 hours. No results for input(s): LIPASE, AMYLASE in the last 8760 hours. No results for input(s): AMMONIA in the last 8760 hours. CBC:  Recent Labs  10/31/14 0920  WBC 5.2  NEUTROABS 2.4  HGB 15.6  HCT 45.5  MCV 92   Lipid Panel:  Recent Labs  10/31/14 0920 07/03/15 0814 10/04/15 0950  CHOL 214* 209* 191  HDL 61 54 64  LDLCALC 121* 117* 102*  TRIG 158* 189* 124  CHOLHDL 3.5 3.9 3.0   Lab Results  Component Value Date   HGBA1C 5.4 10/04/2015   Assessment/Plan 1. Uncontrolled hypertension - bp elevated again today despite increased hydralazine -will given clonidine 0.1mg  today--had improved to 0000000 systolic after he sat for 15 mins even before this; will need to reassess after clonidine--CMA did not document, but was approximately 150/90, as I recall -I am still suspicious of an underlying cause for his blood pressure being so unresponsive to meds despite dietary changes also - US Renal Artery Stenosis; Future to r/o this condition as cause  2. Pterygium eye, left -explained to pt that I think this is the cause of the lesion on the outside of his eye due to prior trauma (reports he almost lost his eye in the past) -received Dr. Dahlia Bailiff notes, but he does not specify what he thinks this is  Labs/tests ordered:  Orders Placed This Encounter  Procedures  . US Renal Artery Stenosis    Standing Status: Future     Number of Occurrences:      Standing Expiration Date: 12/23/2016    Order Specific Question:  Reason for Exam (SYMPTOM  OR DIAGNOSIS REQUIRED)    Answer:  persistent  uncontrolled hypertension    Order Specific Question:  Preferred imaging location?    Answer:  [facility]   Next appt:  2 wks bp check  Jina Olenick L. Evah Rashid, D.O. [facility] [address], [place] [address] Cell Phone (Mon-Fri 8am-5pm):  [phone] On Call:  [phone] & follow prompts after 5pm & weekends Office Phone:  [phone] Office Fax:  [phone]

## 2015-10-28 ENCOUNTER — Encounter: Payer: Self-pay | Admitting: Internal Medicine

## 2015-10-28 DIAGNOSIS — D3192 Benign neoplasm of unspecified part of left eye: Secondary | ICD-10-CM | POA: Insufficient documentation

## 2015-10-28 DIAGNOSIS — J320 Chronic maxillary sinusitis: Secondary | ICD-10-CM | POA: Insufficient documentation

## 2015-10-29 ENCOUNTER — Encounter: Payer: Self-pay | Admitting: Internal Medicine

## 2015-11-02 ENCOUNTER — Other Ambulatory Visit: Payer: Self-pay

## 2015-11-02 DIAGNOSIS — I10 Essential (primary) hypertension: Secondary | ICD-10-CM

## 2015-11-09 ENCOUNTER — Ambulatory Visit (INDEPENDENT_AMBULATORY_CARE_PROVIDER_SITE_OTHER): Payer: Medicare Other | Admitting: Internal Medicine

## 2015-11-09 ENCOUNTER — Encounter: Payer: Self-pay | Admitting: Internal Medicine

## 2015-11-09 VITALS — BP 194/98 | HR 100 | Temp 97.8°F | Resp 20 | Ht 67.0 in | Wt 181.0 lb

## 2015-11-09 DIAGNOSIS — R000 Tachycardia, unspecified: Secondary | ICD-10-CM | POA: Diagnosis not present

## 2015-11-09 DIAGNOSIS — I10 Essential (primary) hypertension: Secondary | ICD-10-CM

## 2015-11-09 MED ORDER — METOPROLOL SUCCINATE ER 25 MG PO TB24
25.0000 mg | ORAL_TABLET | Freq: Every day | ORAL | Status: DC
Start: 2015-11-09 — End: 2016-05-11

## 2015-11-09 NOTE — Patient Instructions (Signed)
Stop hydralazine Start metoprolol once a day for blood pressure and pulse Get the test done on 2/20.

## 2015-11-09 NOTE — Progress Notes (Signed)
Patient ID: Douglas Lowery, male   DOB: 1941/01/23, 75 y.o.   MRN: [id number]   Location: [facility] Provider: Rexene Edison. Mariea Clonts, D.O., C.M.D.  Goals of Care: Advanced Directive information Does patient have an advance directive?: No, Does patient want to make changes to advanced directive?: Yes - information given  Chief Complaint  Patient presents with  . Medical Management of Chronic Issues    2 week follow-up for B/P    HPI: Patient is a 75 y.o. male seen in the office today for med mgt of chronic diseases, primarily to f/u on his blood pressure Has renal US 2/20.  Must fast and only drink water.   BP very high now again today. Taking his medications as directed.  [id number] systolic.   Says he is not worried coming, but BP always high here. Says meds make his eyes water and he feels sluggish.   Takes the two meds in the am, gets droopy eyed and they water, then takes again at lunch, then again at supper.  Each time he takes the hydralazine, he feels sleepy, sluggish and eyes water.   Agrees to try beta blocker Does not abuse drugs.  Only drinks one beer every 2-3 days. Has not headaches, chest pain, sob, weakness, speech or visual changes. Review of Systems:  Review of Systems  Constitutional: Negative for fever, chills and malaise/fatigue.  HENT: Negative for congestion.   Eyes: Negative for blurred vision.  Respiratory: Negative for shortness of breath.   Cardiovascular: Negative for chest pain, palpitations and leg swelling.       HTN  Gastrointestinal: Negative for abdominal pain.  Musculoskeletal: Negative for falls.  Skin: Negative for rash.  Neurological: Negative for dizziness, sensory change, speech change, focal weakness, loss of consciousness and weakness.  Psychiatric/Behavioral: Negative for memory loss.    Past Medical History  Diagnosis Date  . Hypertension   . Sinus drainage   . Hypercholesteremia   . Allergy   . Chronic kidney  disease     Past Surgical History  Procedure Laterality Date  . Eye surgery  2013    Right eye cataract removed--Dr. Katy Fitch  . Colonoscopy  02/2009    Sessile polyp in the rectum. Otherwise normal examination    No Known Allergies    Medication List       This list is accurate as of: 11/09/15  3:44 PM.  Always use your most recent med list.               amLODipine 10 MG tablet  Commonly known as:  NORVASC  Take 1 tablet (10 mg total) by mouth daily.     aspirin 81 MG chewable tablet  Chew 81 mg by mouth daily.     fenofibrate 54 MG tablet  TAKE 1 TABLET (54 MG TOTAL) BY MOUTH DAILY.     hydrALAZINE 100 MG tablet  Commonly known as:  APRESOLINE  Take 1 tablet (100 mg total) by mouth 3 (three) times daily.     mometasone 50 MCG/ACT nasal spray  Commonly known as:  NASONEX  Place 2 sprays into the nose daily.     pravastatin 40 MG tablet  Commonly known as:  PRAVACHOL  TAKE 1 TABLET EVERY DAY FOR CHOLESTEROL     silodosin 8 MG Caps capsule  Commonly known as:  RAPAFLO  Take 8 mg by mouth daily with breakfast. For prostate     solifenacin 5 MG tablet  Commonly known  as:  VESICARE  Take 5 mg by mouth daily.        Health Maintenance  Topic Date Due  . TETANUS/TDAP  09/03/1960  . INFLUENZA VACCINE  04/30/2016  . COLONOSCOPY  03/30/2019  . ZOSTAVAX  Completed  . PNA vac Low Risk Adult  Completed    Physical Exam: Filed Vitals:   11/09/15 1533  BP: 194/98  Pulse: 100  Temp: 97.8 F (36.6 C)  TempSrc: Oral  Resp: 20  Height: 5\' 7"  (1.702 m)  Weight: 181 lb (82.101 kg)  SpO2: 97%   Body mass index is 28.34 kg/(m^2). Physical Exam  Constitutional: He is oriented to person, place, and time. He appears well-developed and well-nourished. No distress.  Cardiovascular: Normal rate, regular rhythm, normal heart sounds and intact distal pulses.   Pulmonary/Chest: Breath sounds normal. No respiratory distress.  Musculoskeletal: Normal range of motion.    Neurological: He is alert and oriented to person, place, and time.  Skin: Skin is warm and dry.  Psychiatric: He has a normal mood and affect.  Does not appear overtly anxious or jittery    Labs reviewed: Basic Metabolic Panel:  Recent Labs  07/03/15 0814 10/04/15 0950  NA 140 140  K 3.9 4.2  CL 101 101  CO2 20 21  GLUCOSE 101* 105*  BUN 16 17  CREATININE 1.27 1.33*  CALCIUM 10.2 10.0   Liver Function Tests: No results for input(s): AST, ALT, ALKPHOS, BILITOT, PROT, ALBUMIN in the last 8760 hours. No results for input(s): LIPASE, AMYLASE in the last 8760 hours. No results for input(s): AMMONIA in the last 8760 hours. CBC: No results for input(s): WBC, NEUTROABS, HGB, HCT, MCV, PLT in the last 8760 hours. Lipid Panel:  Recent Labs  07/03/15 0814 10/04/15 0950  CHOL 209* 191  HDL 54 64  LDLCALC 117* 102*  TRIG 189* 124  CHOLHDL 3.9 3.0   Lab Results  Component Value Date   HGBA1C 5.4 10/04/2015     Assessment/Plan 1. Uncontrolled hypertension -renal ultrasound for renal a stenosis coming up 2/20 -is not tolerating hydralazine well with watery eyes and sluggish feeling after taking each dose - will d/c hydralazine and try him on metoprolol succinate to help with bp and pulse; however, may need higher than 25mg  -he may also have a component of white coat htn as [facility] than [facility]s while here he runs 180s to 200s it seems regardless of what we change his meds to - metoprolol succinate (TOPROL-XL) 25 MG 24 hr tablet; Take 1 tablet (25 mg total) by mouth daily.  Dispense: 30 tablet; Refill: 3  2. Tachycardia -sinus on ekg done another visit and sounds regular -will use beta blocker due to elevated pulse and bp uncontrolled also - metoprolol succinate (TOPROL-XL) 25 MG 24 hr tablet; Take 1 tablet (25 mg total) by mouth daily.  Dispense: 30 tablet; Refill: 3  Labs/tests ordered:  No new Next appt:  F/u with me after renal artery study completed  (2wks)   Pranish Akhavan L. Ercole Georg, D.O. [facility] [address], [place] [address] Cell Phone (Mon-Fri 8am-5pm):  [phone] On Call:  [phone] & follow prompts after 5pm & weekends Office Phone:  [phone] Office Fax:  [phone]

## 2015-11-17 ENCOUNTER — Other Ambulatory Visit: Payer: Medicare Other

## 2015-11-20 ENCOUNTER — Other Ambulatory Visit: Payer: Medicare Other

## 2015-11-21 ENCOUNTER — Ambulatory Visit
Admission: RE | Admit: 2015-11-21 | Discharge: 2015-11-21 | Disposition: A | Discharge status: Home / Self Care | Payer: Medicare Other | Source: Ambulatory Visit | Attending: Internal Medicine | Admitting: Internal Medicine

## 2015-11-21 ENCOUNTER — Other Ambulatory Visit: Payer: Medicare Other

## 2015-11-21 DIAGNOSIS — I10 Essential (primary) hypertension: Secondary | ICD-10-CM

## 2015-11-21 DIAGNOSIS — N281 Cyst of kidney, acquired: Secondary | ICD-10-CM | POA: Diagnosis not present

## 2015-12-06 DIAGNOSIS — J310 Chronic rhinitis: Secondary | ICD-10-CM | POA: Diagnosis not present

## 2015-12-06 DIAGNOSIS — J343 Hypertrophy of nasal turbinates: Secondary | ICD-10-CM | POA: Insufficient documentation

## 2015-12-28 ENCOUNTER — Ambulatory Visit (INDEPENDENT_AMBULATORY_CARE_PROVIDER_SITE_OTHER): Payer: Medicare Other | Admitting: Internal Medicine

## 2015-12-28 ENCOUNTER — Encounter: Payer: Self-pay | Admitting: Internal Medicine

## 2015-12-28 VITALS — BP 148/90 | HR 69 | Temp 97.4°F | Resp 20 | Ht 67.0 in | Wt 180.2 lb

## 2015-12-28 DIAGNOSIS — H40053 Ocular hypertension, bilateral: Secondary | ICD-10-CM | POA: Diagnosis not present

## 2015-12-28 DIAGNOSIS — I10 Essential (primary) hypertension: Secondary | ICD-10-CM | POA: Insufficient documentation

## 2015-12-28 DIAGNOSIS — Z961 Presence of intraocular lens: Secondary | ICD-10-CM | POA: Diagnosis not present

## 2015-12-28 DIAGNOSIS — E782 Mixed hyperlipidemia: Secondary | ICD-10-CM | POA: Diagnosis not present

## 2015-12-28 DIAGNOSIS — M7582 Other shoulder lesions, left shoulder: Secondary | ICD-10-CM

## 2015-12-28 DIAGNOSIS — M778 Other enthesopathies, not elsewhere classified: Secondary | ICD-10-CM

## 2015-12-28 NOTE — Patient Instructions (Addendum)
theragesic cream over the counter to left shoulder 3-4 times per day as needed for muscle pain  DASH Eating Plan DASH stands for "Dietary Approaches to Stop Hypertension." The DASH eating plan is a healthy eating plan that has been shown to reduce high blood pressure (hypertension). Additional health benefits may include reducing the risk of type 2 diabetes mellitus, heart disease, and stroke. The DASH eating plan may also help with weight loss. WHAT DO I NEED TO KNOW ABOUT THE DASH EATING PLAN? For the DASH eating plan, you will follow these general guidelines:  Choose foods with a percent daily value for sodium of less than 5% (as listed on the food label).  Use salt-free seasonings or herbs instead of table salt or sea salt.  Check with your health care provider or pharmacist before using salt substitutes.  Eat lower-sodium products, often labeled as "lower sodium" or "no salt added."  Eat fresh foods.  Eat more vegetables, fruits, and low-fat dairy products.  Choose whole grains. Look for the word "whole" as the first word in the ingredient list.  Choose fish and skinless chicken or [place] more often than red meat. Limit fish, poultry, and meat to 6 oz (170 g) each day.  Limit sweets, desserts, sugars, and sugary drinks.  Choose heart-healthy fats.  Limit cheese to 1 oz (28 g) per day.  Eat more home-cooked food and less restaurant, buffet, and fast food.  Limit fried foods.  Cook foods using methods other than frying.  Limit canned vegetables. If you do use them, rinse them well to decrease the sodium.  When eating at a restaurant, ask that your food be prepared with less salt, or no salt if possible. WHAT FOODS CAN I EAT? Seek help from a dietitian for individual calorie needs. Grains Whole grain or whole wheat bread. Brown rice. Whole grain or whole wheat pasta. Quinoa, bulgur, and whole grain cereals. Low-sodium cereals. Corn or whole wheat flour tortillas. Whole  grain cornbread. Whole grain crackers. Low-sodium crackers. Vegetables Fresh or frozen vegetables (raw, steamed, roasted, or grilled). Low-sodium or reduced-sodium tomato and vegetable juices. Low-sodium or reduced-sodium tomato sauce and paste. Low-sodium or reduced-sodium canned vegetables.  Fruits All fresh, canned (in natural juice), or frozen fruits. Meat and Other Protein Products Ground beef (85% or leaner), grass-fed beef, or beef trimmed of fat. Skinless chicken or [place]. Ground chicken or [place]. Pork trimmed of fat. All fish and seafood. Eggs. Dried beans, peas, or lentils. Unsalted nuts and seeds. Unsalted canned beans. Dairy Low-fat dairy products, such as skim or 1% milk, 2% or reduced-fat cheeses, low-fat ricotta or cottage cheese, or plain low-fat yogurt. Low-sodium or reduced-sodium cheeses. Fats and Oils Tub margarines without trans fats. Light or reduced-fat mayonnaise and salad dressings (reduced sodium). Avocado. Safflower, olive, or canola oils. Natural peanut or almond butter. Other Unsalted popcorn and pretzels. The items listed above may not be a complete list of recommended foods or beverages. Contact your dietitian for more options. WHAT FOODS ARE NOT RECOMMENDED? Grains White bread. White pasta. White rice. Refined cornbread. Bagels and croissants. Crackers that contain trans fat. Vegetables Creamed or fried vegetables. Vegetables in a cheese sauce. Regular canned vegetables. Regular canned tomato sauce and paste. Regular tomato and vegetable juices. Fruits Dried fruits. Canned fruit in light or heavy syrup. Fruit juice. Meat and Other Protein Products Fatty cuts of meat. Ribs, chicken wings, bacon, sausage, bologna, salami, chitterlings, fatback, hot dogs, bratwurst, and packaged luncheon meats. Salted nuts and seeds. Canned  beans with salt. Dairy Whole or 2% milk, cream, half-and-half, and cream cheese. Whole-fat or sweetened yogurt. Full-fat cheeses or blue  cheese. Nondairy creamers and whipped toppings. Processed cheese, cheese spreads, or cheese curds. Condiments Onion and garlic salt, seasoned salt, table salt, and sea salt. Canned and packaged gravies. Worcestershire sauce. Tartar sauce. Barbecue sauce. Teriyaki sauce. Soy sauce, including reduced sodium. Steak sauce. Fish sauce. Oyster sauce. Cocktail sauce. Horseradish. Ketchup and mustard. Meat flavorings and tenderizers. Bouillon cubes. Hot sauce. Tabasco sauce. Marinades. Taco seasonings. Relishes. Fats and Oils Butter, stick margarine, lard, shortening, ghee, and bacon fat. Coconut, palm kernel, or palm oils. Regular salad dressings. Other Pickles and olives. Salted popcorn and pretzels. The items listed above may not be a complete list of foods and beverages to avoid. Contact your dietitian for more information. WHERE CAN I FIND MORE INFORMATION? National Heart, Lung, and Blood Institute: travelstabloid.com   This information is not intended to replace advice given to you by your health care provider. Make sure you discuss any questions you have with your health care provider.   Document Released: 09/05/2011 Document Revised: 10/07/2014 Document Reviewed: 07/21/2013 Elsevier Interactive Patient Education [employer].

## 2015-12-28 NOTE — Progress Notes (Signed)
Patient ID: Douglas Lowery, male   DOB: [birth year], 75 y.o.   MRN: [id number]   Location:  [facility] Provider:  Katharin Schneider L. Mariea Clonts, D.O., C.M.D.  Code Status: full code Goals of Care:  Advanced Directives 11/09/2015  Does patient have an advance directive? No  Does patient want to make changes to advanced directive? Yes - information given  Copy of advanced directive(s) in chart? -     Chief Complaint  Patient presents with  . Medical Management of Chronic Issues    HPI: Patient is a 75 y.o. male seen today for medical management of chronic diseases.    C/o new pain in his left shoulder in the muscles.  Started about a month ago.  Has not done anything for it.  HTN:   Takes all of his meds together at once.  Takes with milk.  toprol xl has helped with HR and his bp is usually good outside of here, but high here.  160 at the moment.  Will recheck.  Avoiding high sodium foods. Recheck 148/90.  Hyperlipidemia:  Cholesterol was improving last check 2 mos ago.  Cont working on diet and exercise.  Past Medical History  Diagnosis Date  . Hypertension   . Sinus drainage   . Hypercholesteremia   . Allergy   . Chronic kidney disease     Past Surgical History  Procedure Laterality Date  . Eye surgery  2013    Right eye cataract removed--Dr. Katy Fitch  . Colonoscopy  02/2009    Sessile polyp in the rectum. Otherwise normal examination    No Known Allergies    Medication List       This list is accurate as of: 12/28/15  3:43 PM.  Always use your most recent med list.               amLODipine 10 MG tablet  Commonly known as:  NORVASC  Take 1 tablet (10 mg total) by mouth daily.     aspirin 81 MG chewable tablet  Chew 81 mg by mouth daily.     fenofibrate 54 MG tablet  TAKE 1 TABLET (54 MG TOTAL) BY MOUTH DAILY.     metoprolol succinate 25 MG 24 hr tablet  Commonly known as:  TOPROL-XL  Take 1 tablet (25 mg total) by mouth daily.     mometasone 50 MCG/ACT nasal spray    Commonly known as:  NASONEX  Place 2 sprays into the nose daily.     pravastatin 40 MG tablet  Commonly known as:  PRAVACHOL  TAKE 1 TABLET EVERY DAY FOR CHOLESTEROL     silodosin 8 MG Caps capsule  Commonly known as:  RAPAFLO  Take 8 mg by mouth daily with breakfast. For prostate     solifenacin 5 MG tablet  Commonly known as:  VESICARE  Take 5 mg by mouth daily.        Review of Systems:  Review of Systems  Constitutional: Negative for fever and chills.  HENT: Negative for congestion.   Eyes: Negative for blurred vision.  Respiratory: Negative for shortness of breath.   Cardiovascular: Negative for chest pain and leg swelling.  Gastrointestinal: Negative for abdominal pain.  Genitourinary: Negative for dysuria.  Musculoskeletal: Negative for falls.  Skin: Negative for rash.  Neurological: Negative for dizziness, loss of consciousness and headaches.  Psychiatric/Behavioral: Negative for depression and memory loss.    Health Maintenance  Topic Date Due  . TETANUS/TDAP  09/03/1960  . INFLUENZA  VACCINE  04/30/2016  . COLONOSCOPY  03/30/2019  . ZOSTAVAX  Completed  . PNA vac Low Risk Adult  Completed    Physical Exam: Filed Vitals:   12/28/15 1528  BP: 160/90  Pulse: 69  Temp: 97.4 F (36.3 C)  TempSrc: Oral  Resp: 20  Height: 5\' 7"  (1.702 m)  Weight: 180 lb 3.2 oz (81.738 kg)  SpO2: 97%   Body mass index is 28.22 kg/(m^2). Physical Exam  Constitutional: He is oriented to person, place, and time. He appears well-developed and well-nourished. No distress.  Cardiovascular: Normal rate, regular rhythm, normal heart sounds and intact distal pulses.   Pulmonary/Chest: Effort normal and breath sounds normal. No respiratory distress.  Abdominal: He exhibits distension.  Musculoskeletal: Normal range of motion.  Neurological: He is alert and oriented to person, place, and time.  Skin: Skin is warm and dry.  Psychiatric: He has a normal mood and affect.     Labs reviewed: Basic Metabolic Panel:  Recent Labs  07/03/15 0814 10/04/15 0950  NA 140 140  K 3.9 4.2  CL 101 101  CO2 20 21  GLUCOSE 101* 105*  BUN 16 17  CREATININE 1.27 1.33*  CALCIUM 10.2 10.0   Liver Function Tests: No results for input(s): AST, ALT, ALKPHOS, BILITOT, PROT, ALBUMIN in the last 8760 hours. No results for input(s): LIPASE, AMYLASE in the last 8760 hours. No results for input(s): AMMONIA in the last 8760 hours. CBC: No results for input(s): WBC, NEUTROABS, HGB, HCT, MCV, PLT in the last 8760 hours. Lipid Panel:  Recent Labs  07/03/15 0814 10/04/15 0950  CHOL 209* 191  HDL 54 64  LDLCALC 117* 102*  TRIG 189* 124  CHOLHDL 3.9 3.0   Lab Results  Component Value Date   HGBA1C 5.4 10/04/2015    Assessment/Plan 1. Left shoulder tendonitis -advised to use theragesic cream for the muscle soreness and then call us back if it doesn't get better or gets worse  2. Uncontrolled hypertension - bp here elevated--suspect more white coat than anything else as it does decrease as he sits here - Basic metabolic panel; Future  3. Mixed hyperlipidemia - cont pravachol and working on diet - Basic metabolic panel; Future - Lipid panel; Future  Labs/tests ordered:   Orders Placed This Encounter  Procedures  . Basic metabolic panel    Standing Status: Future     Number of Occurrences:      Standing Expiration Date: 06/29/2016    Order Specific Question:  Has the patient fasted?    Answer:  Yes  . Lipid panel    Standing Status: Future     Number of Occurrences:      Standing Expiration Date: 06/29/2016    Order Specific Question:  Has the patient fasted?    Answer:  Yes    Next appt:  3 mos med mgt of bp, labs before  Pasty Manninen L. Karry Barrilleaux, D.O. [facility] [address], [place] [address] Cell Phone (Mon-Fri 8am-5pm):  [phone] On Call:  [phone] & follow prompts after 5pm &  weekends Office Phone:  [phone] Office Fax:  [phone]

## 2015-12-29 ENCOUNTER — Encounter: Payer: Self-pay | Admitting: Internal Medicine

## 2016-01-03 ENCOUNTER — Ambulatory Visit: Payer: Medicare Other | Admitting: Internal Medicine

## 2016-01-25 ENCOUNTER — Encounter: Payer: Self-pay | Admitting: Internal Medicine

## 2016-01-25 ENCOUNTER — Ambulatory Visit (INDEPENDENT_AMBULATORY_CARE_PROVIDER_SITE_OTHER): Payer: Medicare Other | Admitting: Internal Medicine

## 2016-01-25 VITALS — BP 160/80 | HR 69 | Temp 98.1°F | Wt 183.0 lb

## 2016-01-25 DIAGNOSIS — M25512 Pain in left shoulder: Secondary | ICD-10-CM

## 2016-01-25 NOTE — Progress Notes (Signed)
Patient ID: Douglas Lowery, male   DOB: 07-Jan-1941, 75 y.o.   MRN: [id number]   Location:  [facility] Provider: Charish Schroepfer L. Mariea Clonts, D.O., C.M.D.  Goals of Care:  Advanced Directives 01/25/2016  Does patient have an advance directive? No  Does patient want to make changes to advanced directive? -   Chief Complaint  Patient presents with  . Acute Visit    shoulder pain x several weeks    HPI: Patient is a 75 y.o. male seen today for an acute visit for left shoulder pain.  Using icy hot muscle rub on it with some benefit, but loss of ROM of the shoulder.  Helps while it's on, but when wears off, back to where he started.    Past Medical History  Diagnosis Date  . Hypertension   . Sinus drainage   . Hypercholesteremia   . Allergy   . Chronic kidney disease     Past Surgical History  Procedure Laterality Date  . Eye surgery  2013    Right eye cataract removed--Dr. Katy Fitch  . Colonoscopy  02/2009    Sessile polyp in the rectum. Otherwise normal examination    No Known Allergies    Medication List       This list is accurate as of: 01/25/16  3:52 PM.  Always use your most recent med list.               amLODipine 10 MG tablet  Commonly known as:  NORVASC  Take 1 tablet (10 mg total) by mouth daily.     aspirin 81 MG chewable tablet  Chew 81 mg by mouth daily.     fenofibrate 54 MG tablet  TAKE 1 TABLET (54 MG TOTAL) BY MOUTH DAILY.     metoprolol succinate 25 MG 24 hr tablet  Commonly known as:  TOPROL-XL  Take 1 tablet (25 mg total) by mouth daily.     mometasone 50 MCG/ACT nasal spray  Commonly known as:  NASONEX  Place 2 sprays into the nose daily.     pravastatin 40 MG tablet  Commonly known as:  PRAVACHOL  TAKE 1 TABLET EVERY DAY FOR CHOLESTEROL     silodosin 8 MG Caps capsule  Commonly known as:  RAPAFLO  Take 8 mg by mouth daily with breakfast. For prostate     solifenacin 5 MG tablet  Commonly known as:  VESICARE  Take 5 mg by mouth daily.         Review of Systems:  Review of Systems  Constitutional: Negative for fever and chills.  HENT: Negative for congestion.   Eyes: Negative for blurred vision.  Respiratory: Negative for shortness of breath.   Cardiovascular: Negative for chest pain.       BP high here, but ok outside of office at pharmacy  Gastrointestinal: Negative for abdominal pain.  Genitourinary: Negative for dysuria.  Musculoskeletal: Positive for myalgias and joint pain. Negative for falls.  Skin: Negative for rash.  Neurological: Negative for dizziness, sensory change, focal weakness, loss of consciousness and headaches.  Psychiatric/Behavioral: Negative for memory loss.    Health Maintenance  Topic Date Due  . TETANUS/TDAP  09/03/1960  . INFLUENZA VACCINE  04/30/2016  . COLONOSCOPY  03/30/2019  . ZOSTAVAX  Completed  . PNA vac Low Risk Adult  Completed    Physical Exam: Filed Vitals:   01/25/16 1540  BP: 160/80  Pulse: 69  Temp: 98.1 F (36.7 C)  TempSrc: Oral  Weight: 183  lb (83.008 kg)  SpO2: 98%   Body mass index is 28.66 kg/(m^2). Physical Exam  Constitutional: He is oriented to person, place, and time. He appears well-developed and well-nourished.  Cardiovascular: Normal rate and regular rhythm.   Pulmonary/Chest: Effort normal and breath sounds normal.  Musculoskeletal: He exhibits tenderness.  Left shoulder at biceps tendon insertion site; rotator cuff insertion; unable to abduction/flex left shoulder; 5/5 strength; right normal rom and no pain  Neurological: He is alert and oriented to person, place, and time.    Labs reviewed: Basic Metabolic Panel:  Recent Labs  07/03/15 0814 10/04/15 0950  NA 140 140  K 3.9 4.2  CL 101 101  CO2 20 21  GLUCOSE 101* 105*  BUN 16 17  CREATININE 1.27 1.33*  CALCIUM 10.2 10.0   Liver Function Tests: No results for input(s): AST, ALT, ALKPHOS, BILITOT, PROT, ALBUMIN in the last 8760 hours. No results for input(s): LIPASE, AMYLASE in the  last 8760 hours. No results for input(s): AMMONIA in the last 8760 hours. CBC: No results for input(s): WBC, NEUTROABS, HGB, HCT, MCV, PLT in the last 8760 hours. Lipid Panel:  Recent Labs  07/03/15 0814 10/04/15 0950  CHOL 209* 191  HDL 54 64  LDLCALC 117* 102*  TRIG 189* 124  CHOLHDL 3.9 3.0   Lab Results  Component Value Date   HGBA1C 5.4 10/04/2015    Procedures since last visit: No new  Assessment/Plan 1. Left shoulder pain -suspect a rotator cuff tear vs tendonitis, but will evaluate first for OA of shoulder with possible bone spurs that may have caused -could also have a tear in his biceps tendon, but no mass in arm on exam - DG Shoulder Left; Future -if this does not fully explain, will need MRI of left shoulder  Labs/tests ordered:   Orders Placed This Encounter  Procedures  . DG Shoulder Left    Standing Status: Future     Number of Occurrences:      Standing Expiration Date: 03/26/2017    Order Specific Question:  Reason for Exam (SYMPTOM  OR DIAGNOSIS REQUIRED)    Answer:  loss of abduction of left shoulder with pain    Order Specific Question:  Preferred imaging location?    Answer:  [id number] [address] Vincie Linn, D.O. [facility] [address], [place] [address] Cell Phone (Mon-Fri 8am-5pm):  [phone] On Call:  [phone] & follow prompts after 5pm & weekends Office Phone:  [phone] Office Fax:  [phone]

## 2016-01-25 NOTE — Patient Instructions (Signed)
Rotator Cuff Injury °Rotator cuff injury is any type of injury to the set of muscles and tendons that make up the stabilizing unit of your shoulder. This unit holds the ball of your upper arm bone (humerus) in the socket of your shoulder blade (scapula).  °CAUSES °Injuries to your rotator cuff most commonly come from sports or activities that cause your arm to be moved repeatedly over your head. Examples of this include throwing, weight lifting, swimming, or racquet sports. Long lasting (chronic) irritation of your rotator cuff can cause soreness and swelling (inflammation), bursitis, and eventual damage to your tendons, such as a tear (rupture). °SIGNS AND SYMPTOMS °Acute rotator cuff tear: °· Sudden tearing sensation followed by severe pain shooting from your upper shoulder down your arm toward your elbow. °· Decreased range of motion of your shoulder because of pain and muscle spasm. °· Severe pain. °· Inability to raise your arm out to the side because of pain and loss of muscle power (large tears). °Chronic rotator cuff tear: °· Pain that usually is worse at night and may interfere with sleep. °· Gradual weakness and decreased shoulder motion as the pain worsens. °· Decreased range of motion. °Rotator cuff tendinitis:  °· Deep ache in your shoulder and the outside upper arm over your shoulder. °· Pain that comes on gradually and becomes worse when lifting your arm to the side or turning it inward. °DIAGNOSIS °Rotator cuff injury is diagnosed through a medical history, physical exam, and imaging exam. The medical history helps determine the type of rotator cuff injury. Your health care provider will look at your injured shoulder, feel the injured area, and ask you to move your shoulder in different positions. X-ray exams typically are done to rule out other causes of shoulder pain, such as fractures. MRI is the exam of choice for the most severe shoulder injuries because the images show muscles and tendons.    °TREATMENT  °Chronic tear: °· Medicine for pain, such as acetaminophen or ibuprofen. °· Physical therapy and range-of-motion exercises may be helpful in maintaining shoulder function and strength. °· Steroid injections into your shoulder joint. °· Surgical repair of the rotator cuff if the injury does not heal with noninvasive treatment. °Acute tear: °· Anti-inflammatory medicines such as ibuprofen and naproxen to help reduce pain and swelling. °· A sling to help support your arm and rest your rotator cuff muscles. Long-term use of a sling is not advised. It may cause significant stiffening of the shoulder joint. °· Surgery may be considered within a few weeks, especially in younger, active people, to return the shoulder to full function. °· Indications for surgical treatment include the following: °¨ Age younger than 60 years. °¨ Rotator cuff tears that are complete. °¨ Physical therapy, rest, and anti-inflammatory medicines have been used for 6-8 weeks, with no improvement. °¨ Employment or sporting activity that requires constant shoulder use. °Tendinitis: °· Anti-inflammatory medicines such as ibuprofen and naproxen to help reduce pain and swelling. °· A sling to help support your arm and rest your rotator cuff muscles. Long-term use of a sling is not advised. It may cause significant stiffening of the shoulder joint. °· Severe tendinitis may require: °¨ Steroid injections into your shoulder joint. °¨ Physical therapy. °¨ Surgery. °HOME CARE INSTRUCTIONS  °· Apply ice to your injury: °¨ Put ice in a plastic bag. °¨ Place a towel between your skin and the bag. °¨ Leave the ice on for 20 minutes, 2-3 times a day. °· If you   have a shoulder immobilizer (sling and straps), wear it until told otherwise by your health care provider. °· You may want to sleep on several pillows or in a recliner at night to lessen swelling and pain. °· Only take over-the-counter or prescription medicines for pain, discomfort, or fever as  directed by your health care provider. °· Do simple hand squeezing exercises with a soft rubber ball to decrease hand swelling. °SEEK MEDICAL CARE IF:  °· Your shoulder pain increases, or new pain or numbness develops in your arm, hand, or fingers. °· Your hand or fingers are colder than your other hand. °SEEK IMMEDIATE MEDICAL CARE IF:  °· Your arm, hand, or fingers are numb or tingling. °· Your arm, hand, or fingers are increasingly swollen and painful, or they turn white or blue. °MAKE SURE YOU: °· Understand these instructions. °· Will watch your condition. °· Will get help right away if you are not doing well or get worse. °  °This information is not intended to replace advice given to you by your health care provider. Make sure you discuss any questions you have with your health care provider. °  °Document Released: 09/13/2000 Document Revised: 09/21/2013 Document Reviewed: 04/28/2013 °Elsevier Interactive Patient Education ©2016 [employer] ° °

## 2016-01-29 ENCOUNTER — Ambulatory Visit
Admission: RE | Admit: 2016-01-29 | Discharge: 2016-01-29 | Disposition: A | Discharge status: Home / Self Care | Payer: Medicare Other | Source: Ambulatory Visit | Attending: Internal Medicine | Admitting: Internal Medicine

## 2016-01-29 DIAGNOSIS — M25512 Pain in left shoulder: Secondary | ICD-10-CM

## 2016-01-29 DIAGNOSIS — M19012 Primary osteoarthritis, left shoulder: Secondary | ICD-10-CM | POA: Diagnosis not present

## 2016-01-31 ENCOUNTER — Other Ambulatory Visit: Payer: Self-pay | Admitting: Internal Medicine

## 2016-02-01 ENCOUNTER — Other Ambulatory Visit: Payer: Self-pay | Admitting: Internal Medicine

## 2016-02-01 DIAGNOSIS — M25512 Pain in left shoulder: Secondary | ICD-10-CM

## 2016-02-18 ENCOUNTER — Ambulatory Visit
Admission: RE | Admit: 2016-02-18 | Discharge: 2016-02-18 | Disposition: A | Discharge status: Home / Self Care | Payer: Medicare Other | Source: Ambulatory Visit | Attending: Internal Medicine | Admitting: Internal Medicine

## 2016-02-18 DIAGNOSIS — M7552 Bursitis of left shoulder: Secondary | ICD-10-CM | POA: Diagnosis not present

## 2016-02-18 DIAGNOSIS — M25512 Pain in left shoulder: Secondary | ICD-10-CM

## 2016-02-19 ENCOUNTER — Other Ambulatory Visit: Payer: Self-pay | Admitting: Internal Medicine

## 2016-02-19 DIAGNOSIS — M7582 Other shoulder lesions, left shoulder: Principal | ICD-10-CM

## 2016-02-19 DIAGNOSIS — M778 Other enthesopathies, not elsewhere classified: Secondary | ICD-10-CM

## 2016-02-23 DIAGNOSIS — M542 Cervicalgia: Secondary | ICD-10-CM | POA: Diagnosis not present

## 2016-02-23 DIAGNOSIS — M25512 Pain in left shoulder: Secondary | ICD-10-CM | POA: Diagnosis not present

## 2016-02-28 ENCOUNTER — Encounter: Payer: Self-pay | Admitting: Internal Medicine

## 2016-02-28 ENCOUNTER — Ambulatory Visit (INDEPENDENT_AMBULATORY_CARE_PROVIDER_SITE_OTHER): Payer: Medicare Other | Admitting: Internal Medicine

## 2016-02-28 VITALS — BP 152/90 | HR 78 | Temp 97.6°F | Ht 67.0 in | Wt 182.0 lb

## 2016-02-28 DIAGNOSIS — M542 Cervicalgia: Secondary | ICD-10-CM | POA: Insufficient documentation

## 2016-02-28 HISTORY — DX: Cervicalgia: M54.2

## 2016-02-28 NOTE — Progress Notes (Signed)
Patient ID: Douglas Lowery, male   DOB: 17-Apr-1941, 75 y.o.   MRN: [id number]    Facility  [facility]    Place of Service:   OFFICE    No Known Allergies  Chief Complaint  Patient presents with  . Acute Visit    pain in left side of neck for 2 weeks. Been having trouble with left shoulder, been x-ray, MRI, was given injection in shoulder on 02/22/16. Prior to pain he had been moving furniture.     HPI:  Has already been seen at office of Raliegh Ip by PA last week. He says he had xray of his neck, but claims he was not told anything about the xray. He says he was told to see his primary doctor. Dr. Mariea Clonts is not is not in the office today, so I am seeing him. He expected our office to have information about his visit with the ortho office, but nothing has been received. Elvina Sidle , CNA called and was told that nothing had been typed about his visit at ortho office.   Right shoulder was injected by ortho for the bursitis and it is feeling better.   He wanted an injection in his neck today. I advised him that this office does not do that. I offered medications and exercises, but he did not want that.  Medications: Patient's Medications  New Prescriptions   No medications on file  Previous Medications   AMLODIPINE (NORVASC) 10 MG TABLET    Take 1 tablet (10 mg total) by mouth daily.   ASPIRIN 81 MG CHEWABLE TABLET    Chew 81 mg by mouth daily.   FENOFIBRATE 54 MG TABLET    TAKE 1 TABLET (54 MG TOTAL) BY MOUTH DAILY.   METOPROLOL SUCCINATE (TOPROL-XL) 25 MG 24 HR TABLET    Take 1 tablet (25 mg total) by mouth daily.   MOMETASONE (NASONEX) 50 MCG/ACT NASAL SPRAY    Place 2 sprays into the nose daily.   PRAVASTATIN (PRAVACHOL) 40 MG TABLET    TAKE 1 TABLET EVERY DAY FOR CHOLESTEROL   SILODOSIN (RAPAFLO) 8 MG CAPS CAPSULE    Take 8 mg by mouth daily with breakfast. For prostate   SOLIFENACIN (VESICARE) 5 MG TABLET    Take 5 mg by mouth daily.  Modified Medications   No medications on  file  Discontinued Medications   No medications on file    Review of Systems  Constitutional: Negative for fever and chills.  HENT: Negative for congestion.   Respiratory: Negative for shortness of breath.   Cardiovascular: Negative for chest pain.       BP high here, but ok outside of office at pharmacy  Gastrointestinal: Negative for abdominal pain.  Genitourinary: Negative for dysuria.  Musculoskeletal: Positive for myalgias and neck pain.  Skin: Negative for rash.  Neurological: Negative for dizziness and headaches.    Filed Vitals:   02/28/16 1501  BP: 152/90  Pulse: 78  Temp: 97.6 F (36.4 C)  TempSrc: Oral  Height: '5\' 7"'  (1.702 m)  Weight: 182 lb (82.555 kg)  SpO2: 98%   Body mass index is 28.5 kg/(m^2). Filed Weights   02/28/16 1501  Weight: 182 lb (82.555 kg)     Physical Exam  Constitutional: He is oriented to person, place, and time. He appears well-developed and well-nourished. No distress.  HENT:  Right Ear: External ear normal.  Left Ear: External ear normal.  Nose: Nose normal.  Mouth/Throat: Oropharynx is clear and moist.  No oropharyngeal exudate.  Eyes: Conjunctivae and EOM are normal. Pupils are equal, round, and reactive to light.  Neck: No JVD present. No tracheal deviation present. No thyromegaly present.  Cardiovascular: Normal rate, regular rhythm, normal heart sounds and intact distal pulses.  Exam reveals no gallop and no friction rub.   No murmur heard. Pulmonary/Chest: Effort normal and breath sounds normal. No respiratory distress. He has no wheezes. He has no rales. He exhibits no tenderness.  Abdominal: He exhibits no distension and no mass. There is no tenderness.  Musculoskeletal: Normal range of motion. He exhibits tenderness. He exhibits no edema.  Left shoulder mobility has improved. Tender in neck on the left side a bout C5.  Lymphadenopathy:    He has no cervical adenopathy.  Neurological: He is alert and oriented to person,  place, and time. He has normal reflexes. No cranial nerve deficit. Coordination normal.  Skin: No rash noted. No erythema. No pallor.  Psychiatric: He has a normal mood and affect. His behavior is normal. Judgment and thought content normal.    Labs reviewed: Lab Summary Latest Ref Rng 10/04/2015 07/03/2015 10/31/2014 06/03/2014  Hemoglobin 12.6 - 17.7 g/dL (None) (None) 15.6 15.5  Hematocrit 37.5 - 51.0 % (None) (None) 45.5 47.5  White count 3.4 - 10.8 x10E3/uL (None) (None) 5.2 6.3  Platelet count - (None) (None) (None) (None)  Sodium 134 - 144 mmol/L 140 140 139 139  Potassium 3.5 - 5.2 mmol/L 4.2 3.9 4.3 4.0  Calcium 8.6 - 10.2 mg/dL 10.0 10.2 10.0 9.9  Phosphorus - (None) (None) (None) (None)  Creatinine 0.76 - 1.27 mg/dL 1.33(H) 1.27 1.36(H) 1.27  AST 0 - 40 IU/L (None) (None) (None) 16  Alk Phos 39 - 117 IU/L (None) (None) (None) 100  Bilirubin 0.0 - 1.2 mg/dL (None) (None) (None) 0.6  Glucose 65 - 99 mg/dL 105(H) 101(H) 92 105(H)  Cholesterol - (None) (None) (None) (None)  HDL cholesterol >39 mg/dL 64 54 61 55  Triglycerides 0 - 149 mg/dL 124 189(H) 158(H) 191(H)  LDL Direct - (None) (None) (None) (None)  LDL Calc 0 - 99 mg/dL 102(H) 117(H) 121(H) 150(H)  Total protein - (None) (None) (None) (None)  Albumin 3.5 - 4.8 g/dL (None) (None) (None) 4.4   Lab Results  Component Value Date   TSH 2.877 12/02/2007   Lab Results  Component Value Date   BUN 17 10/04/2015   BUN 16 07/03/2015   BUN 14 10/31/2014   Lab Results  Component Value Date   HGBA1C 5.4 10/04/2015   HGBA1C 5.4 10/31/2014    Assessment/Plan  Cervicalgia Offered medication and exercises, but patient said he did not need that. He said he will go to the emergency room.

## 2016-03-01 DIAGNOSIS — M542 Cervicalgia: Secondary | ICD-10-CM | POA: Diagnosis not present

## 2016-03-13 ENCOUNTER — Telehealth: Payer: Self-pay | Admitting: *Deleted

## 2016-03-13 NOTE — Telephone Encounter (Signed)
Called left message to return call regard f/u appointment.

## 2016-03-14 NOTE — Telephone Encounter (Signed)
Spoke with Mr Douglas Lowery regarding a follow-up appointment, he stated that his appointment with Dr. Nyoka Cowden did'nt go anywhere so he will follow- up with Dr. Percell Miller then he will make an appointment with Dr. Mariea Clonts.

## 2016-03-27 DIAGNOSIS — M542 Cervicalgia: Secondary | ICD-10-CM | POA: Diagnosis not present

## 2016-05-11 ENCOUNTER — Other Ambulatory Visit: Payer: Self-pay | Admitting: Internal Medicine

## 2016-05-11 DIAGNOSIS — R000 Tachycardia, unspecified: Secondary | ICD-10-CM

## 2016-05-11 DIAGNOSIS — I10 Essential (primary) hypertension: Secondary | ICD-10-CM

## 2016-05-17 ENCOUNTER — Emergency Department (HOSPITAL_COMMUNITY)
Admission: EM | Admit: 2016-05-17 | Discharge: 2016-05-17 | Disposition: A | Discharge status: Home / Self Care | Payer: Medicare Other | Attending: Emergency Medicine | Admitting: Emergency Medicine

## 2016-05-17 ENCOUNTER — Encounter (HOSPITAL_COMMUNITY): Payer: Self-pay

## 2016-05-17 ENCOUNTER — Emergency Department (HOSPITAL_COMMUNITY): Payer: Medicare Other

## 2016-05-17 DIAGNOSIS — I129 Hypertensive chronic kidney disease with stage 1 through stage 4 chronic kidney disease, or unspecified chronic kidney disease: Secondary | ICD-10-CM | POA: Insufficient documentation

## 2016-05-17 DIAGNOSIS — Y9389 Activity, other specified: Secondary | ICD-10-CM | POA: Diagnosis not present

## 2016-05-17 DIAGNOSIS — X58XXXA Exposure to other specified factors, initial encounter: Secondary | ICD-10-CM | POA: Insufficient documentation

## 2016-05-17 DIAGNOSIS — Y999 Unspecified external cause status: Secondary | ICD-10-CM | POA: Insufficient documentation

## 2016-05-17 DIAGNOSIS — Z79899 Other long term (current) drug therapy: Secondary | ICD-10-CM | POA: Diagnosis not present

## 2016-05-17 DIAGNOSIS — M545 Low back pain, unspecified: Secondary | ICD-10-CM

## 2016-05-17 DIAGNOSIS — Z7982 Long term (current) use of aspirin: Secondary | ICD-10-CM | POA: Diagnosis not present

## 2016-05-17 DIAGNOSIS — Y929 Unspecified place or not applicable: Secondary | ICD-10-CM | POA: Insufficient documentation

## 2016-05-17 DIAGNOSIS — N183 Chronic kidney disease, stage 3 (moderate): Secondary | ICD-10-CM | POA: Insufficient documentation

## 2016-05-17 LAB — URINALYSIS, ROUTINE W REFLEX MICROSCOPIC
BILIRUBIN URINE: NEGATIVE
Glucose, UA: NEGATIVE mg/dL
Hgb urine dipstick: NEGATIVE
KETONES UR: NEGATIVE mg/dL
LEUKOCYTES UA: NEGATIVE
NITRITE: NEGATIVE
Protein, ur: NEGATIVE mg/dL
Specific Gravity, Urine: 1.015 (ref 1.005–1.030)
pH: 5.5 (ref 5.0–8.0)

## 2016-05-17 MED ORDER — TRAMADOL HCL 50 MG PO TABS
50.0000 mg | ORAL_TABLET | Freq: Once | ORAL | Status: AC
  Administered 2016-05-17: 50 mg via ORAL
  Filled 2016-05-17: qty 1

## 2016-05-17 NOTE — ED Notes (Signed)
This RN went to recheck pts blood pressure manually. Pt and daughter were not in room. Pt left without discharge papers and instructions.

## 2016-05-17 NOTE — ED Triage Notes (Signed)
PT C/O LOWER BACK PAIN X1 WEEK. PT STS HE WAS MOVING FURNITURE LAST WEEK WHEN THE PAIN STARTED. PT ALSO STS DARK-COLORED URINE AND FREQUENCY.

## 2016-05-17 NOTE — ED Notes (Signed)
Patients daughter asked this RN to come in and take patients blood pressure stating that he had not taken is BP medications today.  Nurse notified of result.

## 2016-05-17 NOTE — ED Provider Notes (Signed)
[facility] DEPT Provider Note   CSN: [id number] Arrival date & time: 05/17/16  1741     History   Chief Complaint Chief Complaint  Patient presents with  . Back Pain    HPI HELIOS GELTZ is a 75 y.o. male.  HPI   75 year old male presents today with complaints of back pain. Patient reports that one week ago he was moving furniture, reports waking up the next morning with very stiff left lower back. He reports the pain is worse with movements, worse with palpation. He reports using ibuprofen at home which seemed to improve his symptoms, but has not taken any last several days. Patient denies any low sure of the strength, neuro deficits, denies any trouble with bowel or bladder at this, denies any fever. No abdominal pain, chest pain or shortness of breath. Patient reports that his urine has been darker than normal, but denies any dysuria. History of the same, no red flags.     Past Medical History:  Diagnosis Date  . Allergy   . Cervicalgia 02/28/2016  . Chronic kidney disease   . Hypercholesteremia   . Hypertension   . Sinus drainage     Patient Active Problem List   Diagnosis Date Noted  . Cervicalgia 02/28/2016  . Uncontrolled hypertension 12/28/2015  . Chronic rhinitis 12/06/2015  . Hypertrophy of nasal turbinates 12/06/2015  . Benign growth on outer layer of left eye 10/28/2015  . 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 Essential hypertension, benign 12/02/2013  . Mixed hyperlipidemia 12/02/2013  . Allergic rhinitis due to pollen 12/21/2012  . BPH (benign prostatic hyperplasia) 12/21/2012  . Cerumen impaction 12/21/2012  . Chronic kidney disease, stage 3     Past Surgical History:  Procedure Laterality Date  . COLONOSCOPY  02/2009   Sessile polyp in the rectum. Otherwise normal examination  . EYE SURGERY  2013   Right eye cataract removed--Dr. Katy Fitch       Home Medications    Prior to Admission medications   Medication Sig Start Date End Date  Taking? Authorizing Provider  amLODipine (NORVASC) 10 MG tablet Take 1 tablet (10 mg total) by mouth daily. 07/06/15   Tiffany L Reed, DO  aspirin 81 MG chewable tablet Chew 81 mg by mouth daily.    Historical Provider, MD  fenofibrate 54 MG tablet TAKE 1 TABLET (54 MG TOTAL) BY MOUTH DAILY. 09/13/15   Tiffany L Reed, DO  metoprolol succinate (TOPROL-XL) 25 MG 24 hr tablet TAKE 1 TABLET (25 MG TOTAL) BY MOUTH DAILY. 05/13/16   Tiffany L Reed, DO  mometasone (NASONEX) 50 MCG/ACT nasal spray Place 2 sprays into the nose daily.    Historical Provider, MD  pravastatin (PRAVACHOL) 40 MG tablet TAKE 1 TABLET EVERY DAY FOR CHOLESTEROL 01/31/16   Tiffany L Reed, DO  silodosin (RAPAFLO) 8 MG CAPS capsule Take 8 mg by mouth daily with breakfast. For prostate 12/21/12   Lauree Chandler, NP  solifenacin (VESICARE) 5 MG tablet Take 5 mg by mouth daily.    Historical Provider, MD    Family History Family History  Problem Relation Age of Onset  . Pneumonia Father   . Alzheimer's disease Sister   . Hypertension Sister   . Hypertension Sister   . Hypertension Sister     Social History Social History  Substance Use Topics  . Smoking status: Never Smoker  . Smokeless tobacco: Never Used  . Alcohol use Yes     Comment: 40 oz beer every week  Allergies   Review of patient's allergies indicates no known allergies.   Review of Systems Review of Systems  All other systems reviewed and are negative.    Physical Exam Updated Vital Signs BP (!) 198/110 (BP Location: Left Arm)   Pulse 65   Temp 98.3 F (36.8 C) (Oral)   Resp 18   Ht 5\' 7"  (1.702 m)   Wt 80.7 kg   SpO2 98%   BMI 27.88 kg/m   Physical Exam  Constitutional: He is oriented to person, place, and time. He appears well-developed and well-nourished. No distress.  HENT:  Head: Normocephalic and atraumatic.  Eyes: Conjunctivae are normal. Pupils are equal, round, and reactive to light. Right eye exhibits no discharge. Left eye  exhibits no discharge. No scleral icterus.  Neck: Normal range of motion. Neck supple. No JVD present. No tracheal deviation present.  Pulmonary/Chest: Effort normal. No stridor.  Musculoskeletal: Normal range of motion. He exhibits tenderness. He exhibits no edema.  No C, T, or L spine tenderness to palpation. No obvious signs of trauma, deformity, infection, step-offs. Lung expansion normal. No scoliosis or kyphosis. Bilateral lower extremity strength 5 out of 5, sensation grossly intact, patellar reflexes 2+, tenderness to palpation of the left lower lumbar soft tissue Straight leg negative Ambulance with  Neurological: He is alert and oriented to person, place, and time. Coordination normal.  Skin: Skin is warm and dry. He is not diaphoretic.  Psychiatric: He has a normal mood and affect. His behavior is normal. Judgment and thought content normal.  Nursing note and vitals reviewed.   ED Treatments / Results  Labs (all labs ordered are listed, but only abnormal results are displayed) Labs Reviewed  URINALYSIS, ROUTINE W REFLEX MICROSCOPIC (NOT AT [facility])    EKG  EKG Interpretation None       Radiology Dg Lumbar Spine Complete  Result Date: 05/17/2016 CLINICAL DATA:  Acute onset of lower back pain, after injury while moving furniture. Initial encounter. EXAM: LUMBAR SPINE - COMPLETE 4+ VIEW COMPARISON:  Lumbar spine radiographs performed 04/17/2012 FINDINGS: There is no evidence of fracture or subluxation. Vertebral bodies demonstrate normal height and alignment. Intervertebral disc spaces are preserved. Facet disease is noted at the lower lumbar spine. Small anterior osteophytes are seen along the lower thoracic and lumbar spine. The visualized bowel gas pattern is unremarkable in appearance; air and stool are noted within the colon. The sacroiliac joints are within normal limits. IMPRESSION: 1. No evidence of fracture or subluxation along the lumbar spine. 2. Minimal degenerative  change along the lower thoracic and lumbar spine. Electronically Signed   By: Garald Balding M.D.   On: 05/17/2016 19:45    Procedures Procedures (including critical care time)  Medications Ordered in ED Medications  traMADol (ULTRAM) tablet 50 mg (50 mg Oral Given 05/17/16 2026)     Initial Impression / Assessment and Plan / ED Course  I have reviewed the triage vital signs and the nursing notes.  Pertinent labs & imaging results that were available during my care of the patient were reviewed by me and considered in my medical decision making (see chart for details).  Clinical Course      Final Clinical Impressions(s) / ED Diagnoses   Final diagnoses:  Left-sided low back pain without sciatica   Labs:  Urinalysis  Imaging:  DG lumbar spine complete  Consults:  Therapeutics:  Discharge Meds:   Assessment/Plan: 75 year old male presents today with likely musculoskeletal back pain. He has no red  flags. Patient given tramadol here in the ED. Patient left the emergency room prior to reassessment. Her      New Prescriptions New Prescriptions   No medications on file     Okey Regal, PA-C 05/17/16 2043    Blanchie Dessert, MD 05/18/16 [phone]

## 2016-05-23 ENCOUNTER — Other Ambulatory Visit: Payer: Self-pay | Admitting: Internal Medicine

## 2016-06-18 ENCOUNTER — Other Ambulatory Visit: Payer: Self-pay | Admitting: Internal Medicine

## 2016-06-18 DIAGNOSIS — I10 Essential (primary) hypertension: Secondary | ICD-10-CM

## 2016-06-18 NOTE — Telephone Encounter (Signed)
Please call him and have him schedule an appointment first before filling the medication.

## 2016-07-04 ENCOUNTER — Telehealth: Payer: Self-pay | Admitting: *Deleted

## 2016-07-04 NOTE — Telephone Encounter (Signed)
.  left message to have patient return my call.  

## 2016-07-04 NOTE — Telephone Encounter (Signed)
-----   Message from Gayland Curry, DO sent at 07/04/2016  7:33 AM EDT ----- Pt was to come for BMP and lipid panel at the end of June.  Where has he been?  ----- Message ----- From: SYSTEM Sent: 07/04/2016  12:05 AM To: Gayland Curry, DO

## 2016-07-12 NOTE — Telephone Encounter (Signed)
.  left message to have patient return my call.  

## 2016-07-17 ENCOUNTER — Other Ambulatory Visit: Payer: Self-pay | Admitting: Internal Medicine

## 2016-08-19 ENCOUNTER — Ambulatory Visit (INDEPENDENT_AMBULATORY_CARE_PROVIDER_SITE_OTHER): Payer: Medicare Other

## 2016-08-19 VITALS — BP 190/100 | HR 80 | Temp 97.5°F | Ht 67.0 in | Wt 188.4 lb

## 2016-08-19 DIAGNOSIS — Z23 Encounter for immunization: Secondary | ICD-10-CM

## 2016-08-19 DIAGNOSIS — Z0000 Encounter for general adult medical examination without abnormal findings: Secondary | ICD-10-CM

## 2016-08-19 NOTE — Progress Notes (Signed)
1   Subjective:   Douglas Lowery is a 75 y.o. male who presents for an Initial Medicare Annual Wellness Visit.  Review of Systems  Cardiac Risk Factors include: advanced age (>64men, >60 women);hypertension;male gender;family history of premature cardiovascular disease    Objective:    Today's Vitals   08/19/16 1452  BP: (!) 190/100  Pulse: 80  Temp: 97.5 F (36.4 C)  TempSrc: Oral  SpO2: 96%  Weight: 188 lb 6.4 oz (85.5 kg)  Height: 5\' 7"  (1.702 m)  PainSc: 0-No pain   Body mass index is 29.51 kg/m.  Current Medications (verified) Outpatient Encounter Prescriptions as of 08/19/2016  Medication Sig  . amLODipine (NORVASC) 10 MG tablet TAKE 1 TABLET (10 MG TOTAL) BY MOUTH DAILY.  Marland Kitchen aspirin 81 MG chewable tablet Chew 81 mg by mouth daily.  . fenofibrate 54 MG tablet TAKE 1 TABLET (54 MG TOTAL) BY MOUTH DAILY.  . metoprolol succinate (TOPROL-XL) 25 MG 24 hr tablet TAKE 1 TABLET (25 MG TOTAL) BY MOUTH DAILY.  . mometasone (NASONEX) 50 MCG/ACT nasal spray Place 2 sprays into the nose daily.  . pravastatin (PRAVACHOL) 40 MG tablet TAKE 1 TABLET EVERY DAY FOR CHOLESTEROL  . silodosin (RAPAFLO) 8 MG CAPS capsule Take 8 mg by mouth daily with breakfast. For prostate  . solifenacin (VESICARE) 5 MG tablet Take 5 mg by mouth daily.   No facility-administered encounter medications on file as of 08/19/2016.     Allergies (verified) Patient has no known allergies.   History: Past Medical History:  Diagnosis Date  . Allergy   . Cervicalgia 02/28/2016  . Chronic kidney disease   . Hypercholesteremia   . Hypertension   . Sinus drainage    Past Surgical History:  Procedure Laterality Date  . COLONOSCOPY  02/2009   Sessile polyp in the rectum. Otherwise normal examination  . EYE SURGERY  2013   Right eye cataract removed--Dr. Katy Fitch   Family History  Problem Relation Age of Onset  . Pneumonia Father   . Alzheimer's disease Sister   . Hypertension Sister   . Hypertension  Sister   . Hypertension Sister    Social History   Occupational History  . Not on file.   Social History Main Topics  . Smoking status: Never Smoker  . Smokeless tobacco: Never Used  . Alcohol use Yes     Comment: 40 oz beer every week  . Drug use: No  . Sexual activity: Yes   Tobacco Counseling Counseling given: No   Activities of Daily Living In your present state of health, do you have any difficulty performing the following activities: 08/19/2016  Hearing? N  Vision? Y  Difficulty concentrating or making decisions? N  Walking or climbing stairs? N  Dressing or bathing? N  Doing errands, shopping? N  Preparing Food and eating ? N  Using the Toilet? N  In the past six months, have you accidently leaked urine? Y  Do you have problems with loss of bowel control? N  Managing your Medications? N  Managing your Finances? N  Housekeeping or managing your Housekeeping? N  Some recent data might be hidden    Immunizations and Health Maintenance Immunization History  Administered Date(s) Administered  . DTaP 10/31/2014  . Influenza,inj,Quad PF,36+ Mos 06/24/2013, 06/09/2014, 07/06/2015, 08/19/2016  . Influenza-Unspecified 06/12/2010, 07/08/2011  . Pneumococcal Conjugate-13 11/03/2014  . Pneumococcal Polysaccharide-23 07/31/2009  . Zoster 10/31/2014   Health Maintenance Due  Topic Date Due  . TETANUS/TDAP  09/03/1960    Patient Care Team: Gayland Curry, DO as PCP - General (Geriatric Medicine) Clent Jacks, MD as Consulting Physician (Ophthalmology) Hurman Horn, MD as Consulting Physician (Ophthalmology)  Indicate any recent Medical Services you may have received from other than [facility] providers in the past year (date may be approximate).    Assessment:   This is a routine wellness examination for Douglas Lowery.  Hearing/Vision screen  Visual Acuity Screening   Right eye Left eye Both eyes  Without correction: 20/40 20/100 20/40-1  With correction: 20/40 20/200  20/40  Comments: Next Eye exam,  Nov. 30, 17.   Hearing Screening Comments: Pt has not had hearing screen done. Denies any problems.  Dietary issues and exercise activities discussed: Current Exercise Habits: The patient does not participate in regular exercise at present, Exercise limited by: None identified  Goals    . Increase water intake          Starting 08/19/16, I will attempt to increase my water intake to 6-8 glasses of water per day.       Depression Screen PHQ 2/9 Scores 08/19/2016 12/28/2015 03/06/2015 11/03/2014  PHQ - 2 Score 0 0 0 0    Fall Risk Fall Risk  08/19/2016 02/28/2016 12/28/2015 11/09/2015 10/26/2015  Falls in the past year? No No No No No  Risk for fall due to : - - - - -  Risk for fall due to (comments): - - - - -    Cognitive Function: MMSE - Mini Mental State Exam 08/19/2016 12/02/2013  Orientation to time 5 5  Orientation to Place 5 5  Registration 3 3  Attention/ Calculation 5 5  Recall 3 2  Language- name 2 objects 2 2  Language- repeat 1 1  Language- follow 3 step command 2 3  Language- read & follow direction 1 1  Write a sentence 1 1  Copy design 1 1  Total score 29 29        Screening Tests Health Maintenance  Topic Date Due  . TETANUS/TDAP  09/03/1960  . COLONOSCOPY  03/30/2019  . INFLUENZA VACCINE  Completed  . ZOSTAVAX  Completed  . PNA vac Low Risk Adult  Completed        Plan:    I have personally reviewed and addressed the Medicare Annual Wellness questionnaire and have noted the following in the patient's chart:  A. Medical and social history B. Use of alcohol, tobacco or illicit drugs  C. Current medications and supplements D. Functional ability and status E.  Nutritional status F.  Physical activity G. Advance directives H. List of other physicians I.  Hospitalizations, surgeries, and ER visits in previous 12 months J.  [place] to include hearing, vision, cognitive, depression L. Referrals and  appointments - none  In addition, I have reviewed and discussed with patient certain preventive protocols, quality metrics, and best practice recommendations. A written personalized care plan for preventive services as well as general preventive health recommendations were provided to patient.  See attached scanned questionnaire for additional information.   Signed,   Allyn Kenner, LPN Health Advisor     I reviewed health advisor's note, was available for consultation and agree with the assessment and plan as written.  [name] bp has never been normal when he comes in, but reportedly it's in the 130s outside of the office like at the pharmacy.  He is always anxious here.  He also does not always take his meds  before coming thought he did say he did today.  He's had imaging to r/o renal a stenosis which was negative.    Douglas Lowery L. Regene Mccarthy, D.O. [facility] [address], [place] [address] Cell Phone (Mon-Fri 8am-5pm):  [phone] On Call:  [phone] & follow prompts after 5pm & weekends Office Phone:  [phone] Office Fax:  [phone]

## 2016-08-19 NOTE — Patient Instructions (Signed)
Mr. Corporon , Thank you for taking time to come for your Medicare Wellness Visit. I appreciate your ongoing commitment to your health goals. Please review the following plan we discussed and let me know if I can assist you in the future.   These are the goals we discussed: Goals    None      This is a list of the screening recommended for you and due dates:  Health Maintenance  Topic Date Due  . Tetanus Vaccine  09/03/1960  . Flu Shot  04/30/2016  . Colon Cancer Screening  03/30/2019  . Shingles Vaccine  Completed  . Pneumonia vaccines  Completed  Preventive Care for Adults  A healthy lifestyle and preventive care can promote health and wellness. Preventive health guidelines for adults include the following key practices.  . A routine yearly physical is a good way to check with your health care provider about your health and preventive screening. It is a chance to share any concerns and updates on your health and to receive a thorough exam.  . Visit your dentist for a routine exam and preventive care every 6 months. Brush your teeth twice a day and floss once a day. Good oral hygiene prevents tooth decay and gum disease.  . The frequency of eye exams is based on your age, health, family medical history, use  of contact lenses, and other factors. Follow your health care provider's ecommendations for frequency of eye exams.  . Eat a healthy diet. Foods like vegetables, fruits, whole grains, low-fat dairy products, and lean protein foods contain the nutrients you need without too many calories. Decrease your intake of foods high in solid fats, added sugars, and salt. Eat the right amount of calories for you. Get information about a proper diet from your health care provider, if necessary.  . Regular physical exercise is one of the most important things you can do for your health. Most adults should get at least 150 minutes of moderate-intensity exercise (any activity that increases your  heart rate and causes you to sweat) each week. In addition, most adults need muscle-strengthening exercises on 2 or more days a week.  Silver Sneakers may be a benefit available to you. To determine eligibility, you may visit the website: www.silversneakers.com or contact program at (520)548-1077 Mon-Fri between 8AM-8PM.   . Maintain a healthy weight. The body mass index (BMI) is a screening tool to identify possible weight problems. It provides an estimate of body fat based on height and weight. Your health care provider can find your BMI and can help you achieve or maintain a healthy weight.   For adults 20 years and older: ? A BMI below 18.5 is considered underweight. ? A BMI of 18.5 to 24.9 is normal. ? A BMI of 25 to 29.9 is considered overweight. ? A BMI of 30 and above is considered obese.   . Maintain normal blood lipids and cholesterol levels by exercising and minimizing your intake of saturated fat. Eat a balanced diet with plenty of fruit and vegetables. Blood tests for lipids and cholesterol should begin at age 53 and be repeated every 5 years. If your lipid or cholesterol levels are high, you are over 50, or you are at high risk for heart disease, you may need your cholesterol levels checked more frequently. Ongoing high lipid and cholesterol levels should be treated with medicines if diet and exercise are not working.  . If you smoke, find out from your health care provider  how to quit. If you do not use tobacco, please do not start.  . If you choose to drink alcohol, please do not consume more than 2 drinks per day. One drink is considered to be 12 ounces (355 mL) of beer, 5 ounces (148 mL) of wine, or 1.5 ounces (44 mL) of liquor.  . If you are 35-15 years old, ask your health care provider if you should take aspirin to prevent strokes.  . Use sunscreen. Apply sunscreen liberally and repeatedly throughout the day. You should seek shade when your shadow is shorter than you.  Protect yourself by wearing long sleeves, pants, a wide-brimmed hat, and sunglasses year round, whenever you are outdoors.  . Once a month, do a whole body skin exam, using a mirror to look at the skin on your back. Tell your health care provider of new moles, moles that have irregular borders, moles that are larger than a pencil eraser, or moles that have changed in shape or color.

## 2016-08-19 NOTE — Progress Notes (Signed)
Quick Notes   Health Maintenance:   Flu shot given; Due for TDAP    Abnormal Screen:  BP Up 190/100, recheck 190/98   Patient Concerns:   None   Nurse Concerns:   Elevated Bp; advised to decrease sodium intake and increase water. F/U physical exam and BP appointment made for Dec. 8th.

## 2016-08-29 DIAGNOSIS — H524 Presbyopia: Secondary | ICD-10-CM | POA: Diagnosis not present

## 2016-08-29 DIAGNOSIS — H31002 Unspecified chorioretinal scars, left eye: Secondary | ICD-10-CM | POA: Diagnosis not present

## 2016-08-29 DIAGNOSIS — Z961 Presence of intraocular lens: Secondary | ICD-10-CM | POA: Diagnosis not present

## 2016-09-06 ENCOUNTER — Encounter: Payer: Self-pay | Admitting: Internal Medicine

## 2016-09-06 ENCOUNTER — Ambulatory Visit (INDEPENDENT_AMBULATORY_CARE_PROVIDER_SITE_OTHER): Payer: Medicare Other | Admitting: Internal Medicine

## 2016-09-06 VITALS — BP 150/80 | HR 67 | Temp 97.7°F | Ht 67.0 in | Wt 188.0 lb

## 2016-09-06 DIAGNOSIS — I10 Essential (primary) hypertension: Secondary | ICD-10-CM | POA: Diagnosis not present

## 2016-09-06 DIAGNOSIS — R351 Nocturia: Secondary | ICD-10-CM | POA: Diagnosis not present

## 2016-09-06 DIAGNOSIS — Z0000 Encounter for general adult medical examination without abnormal findings: Secondary | ICD-10-CM

## 2016-09-06 DIAGNOSIS — N183 Chronic kidney disease, stage 3 unspecified: Secondary | ICD-10-CM

## 2016-09-06 DIAGNOSIS — E782 Mixed hyperlipidemia: Secondary | ICD-10-CM

## 2016-09-06 LAB — LIPID PANEL
Cholesterol: 175 mg/dL (ref <200)
HDL: 53 mg/dL (ref >40)
LDL Cholesterol: 90 mg/dL (ref <100)
Total CHOL/HDL Ratio: 3.3 Ratio (ref <5.0)
Triglycerides: 161 mg/dL — ABNORMAL HIGH (ref <150)
VLDL: 32 mg/dL — ABNORMAL HIGH (ref <30)

## 2016-09-06 LAB — CBC WITH DIFFERENTIAL/PLATELET
Basophils Absolute: 63 cells/uL (ref 0–200)
Basophils Relative: 1 %
Eosinophils Absolute: 567 cells/uL — ABNORMAL HIGH (ref 15–500)
Eosinophils Relative: 9 %
HCT: 46.6 % (ref 38.5–50.0)
Hemoglobin: 15.2 g/dL (ref 13.2–17.1)
Lymphocytes Relative: 37 %
Lymphs Abs: 2331 cells/uL (ref 850–3900)
MCH: 30.6 pg (ref 27.0–33.0)
MCHC: 32.6 g/dL (ref 32.0–36.0)
MCV: 94 fL (ref 80.0–100.0)
MPV: 10.5 fL (ref 7.5–12.5)
Monocytes Absolute: 441 cells/uL (ref 200–950)
Monocytes Relative: 7 %
Neutro Abs: 2898 cells/uL (ref 1500–7800)
Neutrophils Relative %: 46 %
Platelets: 324 10*3/uL (ref 140–400)
RBC: 4.96 MIL/uL (ref 4.20–5.80)
RDW: 13 % (ref 11.0–15.0)
WBC: 6.3 10*3/uL (ref 3.8–10.8)

## 2016-09-06 LAB — COMPLETE METABOLIC PANEL WITH GFR
ALT: 16 U/L (ref 9–46)
AST: 17 U/L (ref 10–35)
Albumin: 4.1 g/dL (ref 3.6–5.1)
Alkaline Phosphatase: 86 U/L (ref 40–115)
BUN: 18 mg/dL (ref 7–25)
CO2: 22 mmol/L (ref 20–31)
Calcium: 9.7 mg/dL (ref 8.6–10.3)
Chloride: 104 mmol/L (ref 98–110)
Creat: 1.3 mg/dL — ABNORMAL HIGH (ref 0.70–1.18)
GFR, Est African American: 62 mL/min (ref >=60)
GFR, Est Non African American: 53 mL/min — ABNORMAL LOW (ref >=60)
Glucose, Bld: 93 mg/dL (ref 65–99)
Potassium: 3.9 mmol/L (ref 3.5–5.3)
Sodium: 138 mmol/L (ref 135–146)
Total Bilirubin: 0.7 mg/dL (ref 0.2–1.2)
Total Protein: 7.5 g/dL (ref 6.1–8.1)

## 2016-09-06 MED ORDER — METOPROLOL SUCCINATE ER 50 MG PO TB24: 50.0000 mg | ORAL_TABLET | Freq: Every day | ORAL | 3 refills | Status: DC

## 2016-09-06 NOTE — Progress Notes (Signed)
Provider:  Rexene Edison. Mariea Clonts, D.O., C.M.D. Location:   [place]   Place of Service:   clinic  Previous PCP: Hollace Kinnier, DO Patient Care Team: Gayland Curry, DO as PCP - General (Geriatric Medicine) Clent Jacks, MD as Consulting Physician (Ophthalmology) Hurman Horn, MD as Consulting Physician (Ophthalmology)  Extended Emergency Contact Information Primary Emergency Contact: Stokes,Frances Address: [address]          [place], [place] [address] [place] Phone: [phone] Relation: Sister Secondary Emergency Contact: Faylene Million Address: [address]          [place], [place] [address] [place] Phone: [phone] Relation: Friend  Code Status:  Full code Goals of Care: Advanced Directive information Advanced Directives 08/19/2016  Does Patient Have a Medical Advance Directive? No;Yes  Type of Advance Directive Living will  Does patient want to make changes to medical advance directive? -  Copy of [place] in Chart? No - copy requested  Would patient like information on creating a medical advance directive? -  was counseled on this previously but has not yet brought documentation  Chief Complaint  Patient presents with  . Annual Exam    physical exam    HPI: Patient is a 75 y.o. male seen today for an annual physical exam.  Alisa gave him some dietary advise at [facility]    HTN:  He has learned that he has to be careful about people cooking with salt.  Drinking more water, a lot of walking.  He's been watching it a lot more since his AWV.  Discussed watching canned foods and frozen foods.  Says he is now rinsing his veggies.  His bp is improved from what it was previous visit.  He [facility] bp at [facility]--has been around [id number] systolic there.  CKD3:  Need f/u labs.  Hyperlipidemia:  Needs f/u labs.  Working on diet.    Nocturia more than twice per night.  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   Previously saw Dr. Janice Norrie.  Now Dr. Saralyn Pilar stg he says.  Nocturia is better now since starting those pills.  He is also watching how much he drinks (water) at night.    He saw Dr. Prudencio Burly about his vision.  Eyes doing well and he was told he did not need to change his prescription unless he could afford.  He is to f/u in 1 year. He showed him a picture of the back of his left eye where it was injured.    Dr. Percell Miller:  Had cortisone injection in left shoulder for bursitis.  All better since.    Past Medical History:  Diagnosis Date  . Allergy   . Cervicalgia 02/28/2016  . Chronic kidney disease   . Hypercholesteremia   . Hypertension   . Sinus drainage    Past Surgical History:  Procedure Laterality Date  . COLONOSCOPY  02/2009   Sessile polyp in the rectum. Otherwise normal examination  . EYE SURGERY  2013   Right eye cataract removed--Dr. Katy Fitch    reports that he has never smoked. He has never used smokeless tobacco. He reports that he drinks alcohol. He reports that he does not use drugs.  Functional Status Survey:    Family History  Problem Relation Age of Onset  . Pneumonia Father   . Alzheimer's disease Sister   . Hypertension Sister   . Hypertension Sister   . Hypertension Sister  Health Maintenance  Topic Date Due  . TETANUS/TDAP  09/03/1960  . COLONOSCOPY  03/30/2019  . INFLUENZA VACCINE  Completed  . ZOSTAVAX  Completed  . PNA vac Low Risk Adult  Completed    No Known Allergies    Medication List       Accurate as of 09/06/16  8:56 AM. Always use your most recent med list.          amLODipine 10 MG tablet Commonly known as:  NORVASC TAKE 1 TABLET (10 MG TOTAL) BY MOUTH DAILY.   aspirin 81 MG chewable tablet Chew 81 mg by mouth daily.   fenofibrate 54 MG tablet TAKE 1 TABLET (54 MG TOTAL) BY MOUTH DAILY.   metoprolol succinate 25 MG 24 hr tablet Commonly known as:  TOPROL-XL TAKE 1 TABLET (25 MG TOTAL) BY MOUTH DAILY.   mometasone 50 MCG/ACT  nasal spray Commonly known as:  NASONEX Place 2 sprays into the nose daily.   pravastatin 40 MG tablet Commonly known as:  PRAVACHOL TAKE 1 TABLET EVERY DAY FOR CHOLESTEROL   silodosin 8 MG Caps capsule Commonly known as:  RAPAFLO Take 8 mg by mouth daily with breakfast. For prostate   solifenacin 5 MG tablet Commonly known as:  VESICARE Take 5 mg by mouth daily.       Review of Systems  Constitutional: Negative for chills, fever and malaise/fatigue.  HENT: Negative for congestion and hearing loss.   Eyes: Negative for blurred vision.       Glasses  Respiratory: Negative for cough, shortness of breath and wheezing.   Cardiovascular: Negative for chest pain, palpitations and leg swelling.  Gastrointestinal: Negative for abdominal pain, blood in stool, constipation and melena.  Genitourinary: Negative for dysuria, frequency and urgency.       Symptoms improved  Musculoskeletal: Negative for back pain, falls, joint pain, myalgias and neck pain.       Left shoulder pain resolved  Skin: Negative for itching and rash.  Neurological: Negative for dizziness, loss of consciousness and weakness.  Endo/Heme/Allergies: Does not bruise/bleed easily.  Psychiatric/Behavioral: Negative for depression and memory loss. The patient does not have insomnia.     Vitals:   09/06/16 0850  BP: (!) 150/80  Pulse: 67  Temp: 97.7 F (36.5 C)  TempSrc: Oral  SpO2: 97%  Weight: 188 lb (85.3 kg)  Height: 5\' 7"  (1.702 m)   Body mass index is 29.44 kg/m. Physical Exam  Constitutional: He is oriented to person, place, and time. He appears well-developed and well-nourished.  HENT:  Head: Normocephalic and atraumatic.  Right Ear: External ear normal.  Left Ear: External ear normal.  Nose: Nose normal.  Mouth/Throat: Oropharynx is clear and moist. No oropharyngeal exudate.  Eyes: Conjunctivae and EOM are normal. Pupils are equal, round, and reactive to light.  glasses  Neck: Normal range of  motion. Neck supple. No JVD present.  Cardiovascular: Normal rate, regular rhythm, normal heart sounds and intact distal pulses.   Pulmonary/Chest: Effort normal and breath sounds normal. No respiratory distress.  Abdominal: Soft. Bowel sounds are normal. He exhibits no distension.  Musculoskeletal: Normal range of motion. He exhibits no tenderness or deformity.  Lymphadenopathy:    He has no cervical adenopathy.  Neurological: He is alert and oriented to person, place, and time. He displays normal reflexes. No cranial nerve deficit. Coordination normal.  Skin: Skin is warm and dry. Capillary refill takes less than 2 seconds.  Psychiatric: He has a normal mood and affect.  His behavior is normal. Judgment and thought content normal.    Labs reviewed: Basic Metabolic Panel:  Recent Labs  10/04/15 0950  NA 140  K 4.2  CL 101  CO2 21  GLUCOSE 105*  BUN 17  CREATININE 1.33*  CALCIUM 10.0   Liver Function Tests: No results for input(s): AST, ALT, ALKPHOS, BILITOT, PROT, ALBUMIN in the last 8760 hours. No results for input(s): LIPASE, AMYLASE in the last 8760 hours. No results for input(s): AMMONIA in the last 8760 hours. CBC: No results for input(s): WBC, NEUTROABS, HGB, HCT, MCV, PLT in the last 8760 hours. Cardiac Enzymes: No results for input(s): CKTOTAL, CKMB, CKMBINDEX, TROPONINI in the last 8760 hours. BNP: Invalid input(s): POCBNP Lab Results  Component Value Date   HGBA1C 5.4 10/04/2015   Lab Results  Component Value Date   TSH 2.877 12/02/2007   Assessment/Plan 1. Annual physical exam - performed.  -pt had AWV with Alisa, LPN and got his flu shot then - is to call insurance about tdap--specific directions provided to pt about what to ask them - CBC with Differential/Platelet - COMPLETE METABOLIC PANEL WITH GFR - Lipid panel  2. Uncontrolled hypertension - bp improved and pulse since metoprolol was added, but bp here still above goal of <130/80 - will  increase toprol to 50mg  from 25mg  (take two until used up, then fill Rx) - COMPLETE METABOLIC PANEL WITH GFR - metoprolol succinate (TOPROL-XL) 50 MG 24 hr tablet; Take 1 tablet (50 mg total) by mouth daily.  Dispense: 30 tablet; Refill: 3  3. Mixed hyperlipidemia - cont pravachol and see where labs are - Lipid panel  4. Chronic kidney disease, stage 3 - cont to work on measures to improve bp and f/u labs -avoid nsaids and other nephrotoxic agents - COMPLETE METABOLIC PANEL WITH GFR  5. Nocturia more than twice per night -better now with rapaflo and vesicare  Labs/tests ordered:   Orders Placed This Encounter  Procedures  . CBC with Differential/Platelet  . COMPLETE METABOLIC PANEL WITH GFR    [facility]  . Lipid panel    Order Specific Question:   Has the patient fasted?    Answer:   Yes    Douglas Lowery L. Silvano Garofano, D.O. [facility] [address], [place] [address] Cell Phone (Mon-Fri 8am-5pm):  [phone] On Call:  [phone] & follow prompts after 5pm & weekends Office Phone:  [phone] Office Fax:  [phone]

## 2016-09-06 NOTE — Patient Instructions (Addendum)
Please call [employer] and ask them if they will cover your tetanus booster at our office or at the pharmacy.  Continue to watch the sodium (salt) in your diet and drink plenty of water.  Continue to walk regularly.  Gradually increase the amount you are walking from 10 minutes with a goal of 30 minutes per day.  I would suggest increasing just by a couple of minutes each week as you can tolerate.  Increase your metoprolol (toprol) blood pressure medicine to 50mg  (from 25mg ).  You may use up your current pills by taking two at once each day.  Then begin the new prescription for 50mg  pills one per day.

## 2016-09-09 ENCOUNTER — Telehealth: Payer: Self-pay | Admitting: *Deleted

## 2016-09-09 ENCOUNTER — Encounter: Payer: Self-pay | Admitting: *Deleted

## 2016-09-09 NOTE — Telephone Encounter (Signed)
Patient called and stated that he spoke with Clair Gulling at his [facility] and was told that Plain Tetanus was covered by his insurance. He stated he was to call and let you know.

## 2016-09-09 NOTE — Telephone Encounter (Signed)
Spoke with patient and advised he can stop by and get the tetanus.

## 2016-09-10 ENCOUNTER — Ambulatory Visit (INDEPENDENT_AMBULATORY_CARE_PROVIDER_SITE_OTHER): Payer: Medicare Other

## 2016-09-10 DIAGNOSIS — Z23 Encounter for immunization: Secondary | ICD-10-CM

## 2016-10-11 DIAGNOSIS — N401 Enlarged prostate with lower urinary tract symptoms: Secondary | ICD-10-CM | POA: Diagnosis not present

## 2016-10-11 DIAGNOSIS — R3915 Urgency of urination: Secondary | ICD-10-CM | POA: Diagnosis not present

## 2016-11-07 ENCOUNTER — Other Ambulatory Visit: Payer: Self-pay | Admitting: Internal Medicine

## 2016-12-05 ENCOUNTER — Ambulatory Visit: Payer: Medicare Other | Admitting: Internal Medicine

## 2016-12-06 ENCOUNTER — Ambulatory Visit (INDEPENDENT_AMBULATORY_CARE_PROVIDER_SITE_OTHER): Payer: Medicare Other | Admitting: Internal Medicine

## 2016-12-06 ENCOUNTER — Encounter: Payer: Self-pay | Admitting: Internal Medicine

## 2016-12-06 VITALS — BP 180/100 | HR 64 | Temp 97.6°F | Wt 188.0 lb

## 2016-12-06 DIAGNOSIS — E663 Overweight: Secondary | ICD-10-CM

## 2016-12-06 DIAGNOSIS — E782 Mixed hyperlipidemia: Secondary | ICD-10-CM | POA: Diagnosis not present

## 2016-12-06 DIAGNOSIS — N183 Chronic kidney disease, stage 3 unspecified: Secondary | ICD-10-CM

## 2016-12-06 DIAGNOSIS — I10 Essential (primary) hypertension: Secondary | ICD-10-CM | POA: Diagnosis not present

## 2016-12-06 MED ORDER — METOPROLOL SUCCINATE ER 100 MG PO TB24: 100.0000 mg | ORAL_TABLET | Freq: Every day | ORAL | 5 refills | Status: DC

## 2016-12-06 NOTE — Progress Notes (Signed)
Location:  [facility] clinic Provider:  Liyla Radliff L. Mariea Clonts, D.O., C.M.D.  Code Status: full code Goals of Care:  Advanced Directives 12/06/2016  Does Patient Have a Medical Advance Directive? No  Type of Advance Directive -  Does patient want to make changes to medical advance directive? -  Copy of [place] in Chart? -  Would patient like information on creating a medical advance directive? -  still has not done   Chief Complaint  Patient presents with  . Medical Management of Chronic Issues    92mth follow-up    HPI: Patient is a 76 y.o. male seen today for medical management of chronic diseases.    His BP is elevated as usual.  He reports having just taken his bp medications 10 mins before coming.  He reports his bp fluctuates considerably.  Pulse has been in 60s to 80s.  Recheck:    CKD:  Cr has been stable for a couple of years.    No further shoulder problems. Dr. Percell Miller had put the shot in his shoulder.    Obesity:  Weight is unchanged.  He continues to exercise.  Says he weighed 155 lbs when he was in school.  Says it's hard to back away from the table.  He is eating some smaller portions.  He has a good appetite.    Past Medical History:  Diagnosis Date  . Allergy   . Cervicalgia 02/28/2016  . Chronic kidney disease   . Hypercholesteremia   . Hypertension   . Sinus drainage     Past Surgical History:  Procedure Laterality Date  . COLONOSCOPY  02/2009   Sessile polyp in the rectum. Otherwise normal examination  . EYE SURGERY  2013   Right eye cataract removed--Dr. Katy Fitch    No Known Allergies  Allergies as of 12/06/2016   No Known Allergies     Medication List       Accurate as of 12/06/16  8:35 AM. Always use your most recent med list.          amLODipine 10 MG tablet Commonly known as:  NORVASC TAKE 1 TABLET (10 MG TOTAL) BY MOUTH DAILY.   aspirin 81 MG chewable tablet Chew 81 mg by mouth daily.   fenofibrate 54 MG tablet TAKE 1  TABLET (54 MG TOTAL) BY MOUTH DAILY.   metoprolol succinate 50 MG 24 hr tablet Commonly known as:  TOPROL-XL Take 1 tablet (50 mg total) by mouth daily.   mometasone 50 MCG/ACT nasal spray Commonly known as:  NASONEX Place 2 sprays into the nose daily.   pravastatin 40 MG tablet Commonly known as:  PRAVACHOL TAKE 1 TABLET EVERY DAY FOR CHOLESTEROL   silodosin 8 MG Caps capsule Commonly known as:  RAPAFLO Take 8 mg by mouth daily with breakfast. For prostate   solifenacin 5 MG tablet Commonly known as:  VESICARE Take 5 mg by mouth daily.       Review of Systems:  Review of Systems  Constitutional: Negative for chills and fever.  HENT: Negative for congestion and hearing loss.   Eyes: Negative for blurred vision.  Respiratory: Negative for shortness of breath.   Cardiovascular: Negative for chest pain, palpitations and leg swelling.  Gastrointestinal: Negative for abdominal pain.  Genitourinary: Negative for dysuria, frequency and urgency.  Musculoskeletal: Negative for falls and joint pain.  Skin: Negative for itching and rash.  Neurological: Negative for dizziness.  Endo/Heme/Allergies: Does not bruise/bleed easily.  Psychiatric/Behavioral: Negative for  memory loss.    Health Maintenance  Topic Date Due  . COLONOSCOPY  03/30/2019  . TETANUS/TDAP  09/10/2026  . INFLUENZA VACCINE  Completed  . PNA vac Low Risk Adult  Completed    Physical Exam: Vitals:   12/06/16 0824  BP: (!) 180/100  Pulse: 64  Temp: 97.6 F (36.4 C)  TempSrc: Oral  SpO2: 98%  Weight: 188 lb (85.3 kg)   Body mass index is 29.44 kg/m. Physical Exam  Constitutional: He is oriented to person, place, and time. He appears well-developed and well-nourished. No distress.  HENT:  Head: Normocephalic and atraumatic.  Eyes:  glasses  Cardiovascular: Normal rate, regular rhythm, normal heart sounds and intact distal pulses.   Pulmonary/Chest: Effort normal and breath sounds normal. No  respiratory distress.  Abdominal: Soft. Bowel sounds are normal.  Musculoskeletal: Normal range of motion.  Neurological: He is alert and oriented to person, place, and time.  Skin: Skin is warm and dry.  Psychiatric: He has a normal mood and affect.    Labs reviewed: Basic Metabolic Panel:  Recent Labs  09/06/16 0935  NA 138  K 3.9  CL 104  CO2 22  GLUCOSE 93  BUN 18  CREATININE 1.30*  CALCIUM 9.7   Liver Function Tests:  Recent Labs  09/06/16 0935  AST 17  ALT 16  ALKPHOS 86  BILITOT 0.7  PROT 7.5  ALBUMIN 4.1   No results for input(s): LIPASE, AMYLASE in the last 8760 hours. No results for input(s): AMMONIA in the last 8760 hours. CBC:  Recent Labs  09/06/16 0935  WBC 6.3  NEUTROABS 2,898  HGB 15.2  HCT 46.6  MCV 94.0  PLT 324   Lipid Panel:  Recent Labs  09/06/16 0935  CHOL 175  HDL 53  LDLCALC 90  TRIG 161*  CHOLHDL 3.3   Lab Results  Component Value Date   HGBA1C 5.4 10/04/2015    Assessment/Plan 1. Uncontrolled hypertension -bp did improve to 158/80 on recheck, but this is still not at goal - pt to take 2 of his 50mg  pills until he uses them up, then pick up the 100mg  tablets -return in 2 wks for bp check - metoprolol succinate (TOPROL-XL) 100 MG 24 hr tablet; Take 1 tablet (100 mg total) by mouth daily.  Dispense: 30 tablet; Refill: 5 -cont amlodipine 10mg  daily  2. Chronic kidney disease, stage 3 -stable, cont to monitor  3. Mixed hyperlipidemia -cont pravachol, last LDL was <100  4. Overweight (BMI 25.0-29.9) -cont to work on diet and exercise, reeducated again about dietary changes he should make to help with weight loss and benefits on bp, sugar and lipids.   Labs/tests ordered:  No new Next appt:  2 wks for bp check  Verdon Ferrante L. Bridie Colquhoun, D.O. [facility] [address], [place] [address] Cell Phone (Mon-Fri 8am-5pm):  [phone] On Call:  [phone] & follow  prompts after 5pm & weekends Office Phone:  [phone] Office Fax:  [phone]

## 2016-12-06 NOTE — Patient Instructions (Signed)
Take two of the toprol xl (metoprolol succinate) 50mg  daily until you use up the pills you have.

## 2016-12-20 ENCOUNTER — Encounter: Payer: Self-pay | Admitting: Internal Medicine

## 2016-12-20 ENCOUNTER — Ambulatory Visit (INDEPENDENT_AMBULATORY_CARE_PROVIDER_SITE_OTHER): Payer: Medicare Other | Admitting: Internal Medicine

## 2016-12-20 VITALS — BP 156/100 | HR 60 | Temp 97.9°F | Ht 67.0 in | Wt 190.0 lb

## 2016-12-20 DIAGNOSIS — N183 Chronic kidney disease, stage 3 unspecified: Secondary | ICD-10-CM

## 2016-12-20 DIAGNOSIS — I10 Essential (primary) hypertension: Secondary | ICD-10-CM | POA: Diagnosis not present

## 2016-12-20 DIAGNOSIS — E782 Mixed hyperlipidemia: Secondary | ICD-10-CM

## 2016-12-20 DIAGNOSIS — R739 Hyperglycemia, unspecified: Secondary | ICD-10-CM | POA: Diagnosis not present

## 2016-12-20 MED ORDER — METOPROLOL SUCCINATE ER 200 MG PO TB24: 200.0000 mg | ORAL_TABLET | Freq: Every day | ORAL | 3 refills | Status: DC

## 2016-12-20 NOTE — Patient Instructions (Signed)
Increase your metoprolol succinate (toprol XL) once more to 200mg  daily.  Continue all of your other medications as ordered. Return in 2 more weeks with labs before the appointment.   If your blood pressure is still not at goal, I will refer you to cardiology.

## 2016-12-20 NOTE — Progress Notes (Signed)
Location:  [facility] clinic Provider:  Jaki Steptoe L. Mariea Clonts, D.O., C.M.D.  Code Status: full code Goals of Care:  Advanced Directives 12/06/2016  Does Patient Have a Medical Advance Directive? No  Type of Advance Directive -  Does patient want to make changes to medical advance directive? -  Copy of [place] in Chart? -  Would patient like information on creating a medical advance directive? -     Chief Complaint  Patient presents with  . Hypertension    2 week follow-up on blood pressure   . Medication Refill    No refills needed today     HPI: Patient is a 76 y.o. male seen today for follow up on bp.  He's concerned that he is dwelling on his blood pressure and making it go up.  It's been in the 976B systolic at the pharmacy bp cuffs.  He has not been out walking b/c of the cold weather.  He knows if he could lose a few lbs, he'd do better. He has been cutting down on his portions and trying not to have seconds. Is watching his sodium intake.     Past Medical History:  Diagnosis Date  . Allergy   . Cervicalgia 02/28/2016  . Chronic kidney disease   . Hypercholesteremia   . Hypertension   . Sinus drainage     Past Surgical History:  Procedure Laterality Date  . COLONOSCOPY  02/2009   Sessile polyp in the rectum. Otherwise normal examination  . EYE SURGERY  2013   Right eye cataract removed--Dr. Katy Fitch    No Known Allergies  Allergies as of 12/20/2016   No Known Allergies     Medication List       Accurate as of 12/20/16 11:28 AM. Always use your most recent med list.          amLODipine 10 MG tablet Commonly known as:  NORVASC TAKE 1 TABLET (10 MG TOTAL) BY MOUTH DAILY.   aspirin 81 MG chewable tablet Chew 81 mg by mouth daily.   fenofibrate 54 MG tablet TAKE 1 TABLET (54 MG TOTAL) BY MOUTH DAILY.   metoprolol succinate 100 MG 24 hr tablet Commonly known as:  TOPROL-XL Take 1 tablet (100 mg total) by mouth daily.   mometasone 50 MCG/ACT  nasal spray Commonly known as:  NASONEX Place 2 sprays into the nose daily.   pravastatin 40 MG tablet Commonly known as:  PRAVACHOL TAKE 1 TABLET EVERY DAY FOR CHOLESTEROL   silodosin 8 MG Caps capsule Commonly known as:  RAPAFLO Take 8 mg by mouth daily with breakfast. For prostate   solifenacin 5 MG tablet Commonly known as:  VESICARE Take 5 mg by mouth daily.       Review of Systems:  Review of Systems  Constitutional: Negative for chills and fever.  HENT: Negative for congestion.   Eyes: Negative for blurred vision.  Respiratory: Negative for cough and shortness of breath.   Cardiovascular: Negative for chest pain, palpitations and leg swelling.  Gastrointestinal: Negative for abdominal pain.  Genitourinary: Negative for dysuria.  Musculoskeletal: Negative for falls.  Neurological: Negative for dizziness, sensory change, focal weakness, loss of consciousness and headaches.  Psychiatric/Behavioral: The patient is nervous/anxious.     Health Maintenance  Topic Date Due  . COLONOSCOPY  03/30/2019  . TETANUS/TDAP  09/10/2026  . INFLUENZA VACCINE  Completed  . PNA vac Low Risk Adult  Completed    Physical Exam: Vitals:   12/20/16  1116  BP: (!) 158/102  Pulse: 60  Temp: 97.9 F (36.6 C)  TempSrc: Oral  SpO2: 98%  Weight: 190 lb (86.2 kg)  Height: 5\' 7"  (1.702 m)   Body mass index is 29.76 kg/m. Physical Exam  Constitutional: He is oriented to person, place, and time. He appears well-developed and well-nourished. No distress.  Cardiovascular: Normal rate, regular rhythm, normal heart sounds and intact distal pulses.   Pulmonary/Chest: Effort normal and breath sounds normal. No respiratory distress.  Musculoskeletal: Normal range of motion.  Neurological: He is alert and oriented to person, place, and time.  Skin: Skin is warm and dry. Capillary refill takes less than 2 seconds.  Psychiatric: He has a normal mood and affect.    Labs reviewed: Basic  Metabolic Panel:  Recent Labs  09/06/16 0935  NA 138  K 3.9  CL 104  CO2 22  GLUCOSE 93  BUN 18  CREATININE 1.30*  CALCIUM 9.7   Liver Function Tests:  Recent Labs  09/06/16 0935  AST 17  ALT 16  ALKPHOS 86  BILITOT 0.7  PROT 7.5  ALBUMIN 4.1   No results for input(s): LIPASE, AMYLASE in the last 8760 hours. No results for input(s): AMMONIA in the last 8760 hours. CBC:  Recent Labs  09/06/16 0935  WBC 6.3  NEUTROABS 2,898  HGB 15.2  HCT 46.6  MCV 94.0  PLT 324   Lipid Panel:  Recent Labs  09/06/16 0935  CHOL 175  HDL 53  LDLCALC 90  TRIG 161*  CHOLHDL 3.3   Lab Results  Component Value Date   HGBA1C 5.4 10/04/2015    Assessment/Plan 1. Uncontrolled hypertension - bp still elevated over 382 and diastolic also high over 505 - will increase toprol to 200mg  daily--he may take two of his current 100mg  pills until he uses them up, but new Rx was sent to avoid confusion) - COMPLETE METABOLIC PANEL WITH GFR; Future - metoprolol succinate (TOPROL-XL) 200 MG 24 hr tablet; Take 1 tablet (200 mg total) by mouth daily.  Dispense: 30 tablet; Refill: 3  2. Chronic kidney disease, stage 3 -cont to work on improving bp - recheck renal function: - COMPLETE METABOLIC PANEL WITH GFR; Future  3. Mixed hyperlipidemia -cont fenofibrate therapy and reassess - Lipid panel; Future  4. Hyperglycemia - must work more on diet and exercise as discussed in hpi - Hemoglobin A1c; Future  Labs/tests ordered:   Orders Placed This Encounter  Procedures  . COMPLETE METABOLIC PANEL WITH GFR    [facility]    Standing Status:   Future    Standing Expiration Date:   01/20/2017  . Hemoglobin A1c    Standing Status:   Future    Standing Expiration Date:   01/20/2017  . Lipid panel    Standing Status:   Future    Standing Expiration Date:   01/20/2017    Order Specific Question:   Has the patient fasted?    Answer:   Yes   Next appt:  01/10/2017 bp check with labs  before  [place]. Mykeria Garman, D.O. [facility] [address], [place] [address] Cell Phone (Mon-Fri 8am-5pm):  [phone] On Call:  [phone] & follow prompts after 5pm & weekends Office Phone:  [phone] Office Fax:  [phone]

## 2016-12-22 ENCOUNTER — Emergency Department (HOSPITAL_COMMUNITY): Payer: Medicare Other

## 2016-12-22 ENCOUNTER — Emergency Department (HOSPITAL_COMMUNITY)
Admission: EM | Admit: 2016-12-22 | Discharge: 2016-12-22 | Disposition: A | Discharge status: Home / Self Care | Payer: Medicare Other | Attending: Emergency Medicine | Admitting: Emergency Medicine

## 2016-12-22 ENCOUNTER — Encounter (HOSPITAL_COMMUNITY): Payer: Self-pay | Admitting: Emergency Medicine

## 2016-12-22 DIAGNOSIS — Y929 Unspecified place or not applicable: Secondary | ICD-10-CM | POA: Insufficient documentation

## 2016-12-22 DIAGNOSIS — S3992XA Unspecified injury of lower back, initial encounter: Secondary | ICD-10-CM | POA: Diagnosis not present

## 2016-12-22 DIAGNOSIS — W07XXXA Fall from chair, initial encounter: Secondary | ICD-10-CM | POA: Diagnosis not present

## 2016-12-22 DIAGNOSIS — Y999 Unspecified external cause status: Secondary | ICD-10-CM | POA: Insufficient documentation

## 2016-12-22 DIAGNOSIS — Z7982 Long term (current) use of aspirin: Secondary | ICD-10-CM | POA: Diagnosis not present

## 2016-12-22 DIAGNOSIS — I129 Hypertensive chronic kidney disease with stage 1 through stage 4 chronic kidney disease, or unspecified chronic kidney disease: Secondary | ICD-10-CM | POA: Insufficient documentation

## 2016-12-22 DIAGNOSIS — M545 Low back pain, unspecified: Secondary | ICD-10-CM

## 2016-12-22 DIAGNOSIS — N183 Chronic kidney disease, stage 3 (moderate): Secondary | ICD-10-CM | POA: Diagnosis not present

## 2016-12-22 DIAGNOSIS — W19XXXA Unspecified fall, initial encounter: Secondary | ICD-10-CM

## 2016-12-22 DIAGNOSIS — Y939 Activity, unspecified: Secondary | ICD-10-CM | POA: Insufficient documentation

## 2016-12-22 MED ORDER — HYDROCODONE-ACETAMINOPHEN 5-325 MG PO TABS
1.0000 | ORAL_TABLET | Freq: Once | ORAL | Status: AC
  Administered 2016-12-22: 2 via ORAL
  Filled 2016-12-22: qty 2

## 2016-12-22 MED ORDER — HYDROCODONE-ACETAMINOPHEN 5-325 MG PO TABS: 2.0000 | ORAL_TABLET | Freq: Four times a day (QID) | ORAL | 0 refills | Status: DC | PRN

## 2016-12-22 NOTE — ED Notes (Signed)
Patient transported to X-ray 

## 2016-12-22 NOTE — Discharge Instructions (Addendum)
Back Pain:  Take medications as prescribed for pain.   Follow-up with your primary care doctor this week. Call on Monday to arrange to be seen this week.   Be aware that if you develop new symptoms, such as a fever, leg weakness, difficulty with or loss of control of your urine or bowels, abdominal pain, or more severe pain, you will need to seek medical attention and  / or return to the Emergency department.

## 2016-12-22 NOTE — ED Notes (Signed)
Pt transported to xray 

## 2016-12-22 NOTE — ED Triage Notes (Signed)
Pt presents with 10/10 lumbosacral back pain that stops at his hips.  Pt reports falling off a chair Monday and is not hurting.  No n/v/d, numbness, weakness, tingling; just pain.

## 2016-12-22 NOTE — ED Provider Notes (Signed)
[facility] DEPT Provider Note   CSN: [id number] Arrival date & time: 12/22/16  2706     History   Chief Complaint Chief Complaint  Patient presents with  . Back Pain    HPI  Douglas Lowery is a 76 y.o. male who presents with 1 day of progressively worsening lower back pain. He denies any inciting event that preceded onset of pain, but does report that he had a mechanical fall out of a chair where he landed on his back  6 days ago. He did not hit his head or have any LOC just after the incident. His pain is localized to the lower lumbar region and does not radiate to the front or down his legs. He has been able to walk since onset of pain. He states that his pain is currently a 10/10 and is worsened with movement or attempting to stand up from a seated position. He has taken ibuprofen with no improvement of symptoms. He has had some recent constipation but states that is a chronic issue. He denies any numbness/weakness of his extremities, bowel/bladder incontinence, saddle anesthesia, fever, history of spinal surgery, IVDU, or any recent weight loss.    The history is provided by the patient.    Past Medical History:  Diagnosis Date  . Allergy   . Cervicalgia 02/28/2016  . Chronic kidney disease   . Hypercholesteremia   . Hypertension   . Sinus drainage     Patient Active Problem List   Diagnosis Date Noted  . Cervicalgia 02/28/2016  . Uncontrolled hypertension 12/28/2015  . Chronic rhinitis 12/06/2015  . Hypertrophy of nasal turbinates 12/06/2015  . Benign growth on outer layer of left eye 10/28/2015  . 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 Essential hypertension, benign 12/02/2013  . Mixed hyperlipidemia 12/02/2013  . Allergic rhinitis due to pollen 12/21/2012  . BPH (benign prostatic hyperplasia) 12/21/2012  . Cerumen impaction 12/21/2012  . Chronic kidney disease, stage 3     Past Surgical History:  Procedure Laterality Date  . COLONOSCOPY  02/2009   Sessile polyp in the rectum. Otherwise normal examination  . EYE SURGERY  2013   Right eye cataract removed--Dr. Katy Fitch       Home Medications    Prior to Admission medications   Medication Sig Start Date End Date Taking? Authorizing Provider  amLODipine (NORVASC) 10 MG tablet TAKE 1 TABLET (10 MG TOTAL) BY MOUTH DAILY. 06/18/16  Yes Tiffany L Reed, DO  aspirin 81 MG chewable tablet Chew 81 mg by mouth daily.   Yes Historical Provider, MD  fenofibrate 54 MG tablet TAKE 1 TABLET (54 MG TOTAL) BY MOUTH DAILY. 05/24/16  Yes Tiffany L Reed, DO  ibuprofen (ADVIL,MOTRIN) 200 MG tablet Take 200 mg by mouth every 6 (six) hours as needed for moderate pain.   Yes Historical Provider, MD  metoprolol succinate (TOPROL-XL) 200 MG 24 hr tablet Take 1 tablet (200 mg total) by mouth daily. 12/20/16  Yes Tiffany L Reed, DO  mometasone (NASONEX) 50 MCG/ACT nasal spray Place 2 sprays into the nose daily.   Yes Historical Provider, MD  pravastatin (PRAVACHOL) 40 MG tablet TAKE 1 TABLET EVERY DAY FOR CHOLESTEROL 11/07/16  Yes Tiffany L Reed, DO  silodosin (RAPAFLO) 8 MG CAPS capsule Take 8 mg by mouth daily with breakfast. For prostate 12/21/12  Yes Lauree Chandler, NP  solifenacin (VESICARE) 5 MG tablet Take 5 mg by mouth daily.   Yes Historical Provider, MD  HYDROcodone-acetaminophen (NORCO/VICODIN) 5-325 MG  tablet Take 2 tablets by mouth every 6 (six) hours as needed. 12/22/16   Orson Aloe, PA    Family History Family History  Problem Relation Age of Onset  . Pneumonia Father   . Alzheimer's disease Sister   . Hypertension Sister   . Hypertension Sister   . Hypertension Sister     Social History Social History  Substance Use Topics  . Smoking status: Never Smoker  . Smokeless tobacco: Never Used  . Alcohol use Yes     Comment: 40 oz beer every week     Allergies   Patient has no known allergies.   Review of Systems Review of Systems  Constitutional: Negative for chills, fever and  unexpected weight change.  HENT: Negative for congestion and rhinorrhea.   Respiratory: Negative for cough and shortness of breath.   Cardiovascular: Negative for chest pain.  Gastrointestinal: Positive for constipation. Negative for abdominal pain, blood in stool, diarrhea, nausea and vomiting.  Genitourinary: Negative for dysuria and hematuria.  Musculoskeletal: Positive for back pain. Negative for neck pain.  Skin: Negative for rash.  Neurological: Negative for dizziness, weakness, numbness and headaches.  All other systems reviewed and are negative.    Physical Exam Updated Vital Signs BP (!) 161/87   Pulse 60   Temp 97.4 F (36.3 C) (Oral)   Resp 18   Ht 5\' 7"  (1.702 m)   Wt 86.2 kg   SpO2 97%   BMI 29.76 kg/m   Physical Exam  Constitutional: He appears well-developed and well-nourished.  HENT:  Head: Normocephalic and atraumatic.  Mouth/Throat: Oropharynx is clear and moist and mucous membranes are normal.  Eyes: Conjunctivae, EOM and lids are normal.  Right pupil 85mm, patient reports previous cataract removal. Left pupil 2 mm and reactive  Neck: Full passive range of motion without pain. No spinous process tenderness and no muscular tenderness present.  Full flexion/extension and lateral movement of neck without pain   Cardiovascular: Normal rate, regular rhythm and intact distal pulses.   Pulmonary/Chest: Effort normal and breath sounds normal.  Abdominal: Soft. There is no tenderness.  Protuberant abdomen  Musculoskeletal: He exhibits no deformity.       Cervical back: He exhibits no tenderness.       Thoracic back: He exhibits no tenderness.  Moderate midline tenderness to palpation of lower lumbar region, starting at approximately L4 level and extending distally. Right sided paraspinal tenderness at lower lumbar region.  Severely limited flexion and extension of back secondary to pain.   Negative straight leg raise bilaterally   Neurological: He is alert.    Follows commands, Moves all extremities  5/5 strength to BUE and BLE  Sensation intact throughout   Skin: Skin is warm and dry.  Psychiatric: He has a normal mood and affect. His speech is normal and behavior is normal.     ED Treatments / Results  Labs (all labs ordered are listed, but only abnormal results are displayed) Labs Reviewed - No data to display  EKG  EKG Interpretation None       Radiology Dg Lumbar Spine Complete  Result Date: 12/22/2016 CLINICAL DATA:  Pain following fall EXAM: LUMBAR SPINE - COMPLETE 4+ VIEW COMPARISON:  May 17, 2016. FINDINGS: Frontal, lateral, spot lumbosacral lateral, and bilateral oblique views were obtained. There are 5 non-rib-bearing lumbar type vertebral bodies. There is no fracture. There is stable 4 mm of retrolisthesis of L3 on L4. There is stable 3 mm of anterolisthesis of L4  on L5. No new spondylolisthesis. There is moderate disc space narrowing at L3-4, L4-5, L5-S1. There are anterior osteophytes in the lower thoracic region as well as at L1, L3, and L4. There is facet osteoarthritic change at L3-4, L4-5, and L5-S1 bilaterally. IMPRESSION: No fracture. 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 Electronically Signed   By: Lowella Grip III M.D.   On: 12/22/2016 08:18    Procedures Procedures (including critical care time)  Medications Ordered in ED Medications  HYDROcodone-acetaminophen (NORCO/VICODIN) 5-325 MG per tablet 1-2 tablet (2 tablets Oral Given 12/22/16 4497)     Initial Impression / Assessment and Plan / ED Course  I have reviewed the triage vital signs and the nursing notes.  Pertinent labs & imaging results that were available during my care of the patient were reviewed by me and considered in my medical decision making (see chart for details).     76 yo M presents with 1 day of lower back pain. History of mechanical fall from chair 6 days ago. No red flag  symptoms. No history of IVDU or spinal surgery. Consider compression fracture, given history of fall, vs musculoskeletal pain/strain. Plan to XR lumbar spine for eval of fracture vs malignancy. Given Vicodin in the ED for pain control.   XRs reviewed. Negative for acute fracture. Mild spondolithesis at L3-L4 but otherwise stable.   8:56 AM: Patient reports improvement in pain and mobility after Vicodin dose in the ED.   9:05 AM: Reviewed imaging with patient and family member. Patient reporting improvement in pain after initial Vicodin given in the ED. Stable for discharge at this time. Given his history of chronic kidney disease, will provide a short course of Vicodin for pain control at home so that is not taking NSAIDs that could potentially exacerbate his deteriorating kidney function. Records reviewed and he only has 1 ED visit for back pain in the last year. [place] Controlled Substance Reporting System shows no opioid prescription in the last year. Instructed him to call his PCP on Monday and arrange for a follow-up appointment in the next week to further evaluation. Return precautions discussed with patient. Patient and family expresses understanding and agreement to plan.    Final Clinical Impressions(s) / ED Diagnoses   Final diagnoses:  Acute bilateral low back pain without sciatica    New Prescriptions New Prescriptions   HYDROCODONE-ACETAMINOPHEN (NORCO/VICODIN) 5-325 MG TABLET    Take 2 tablets by mouth every 6 (six) hours as needed.     [place], [place] 12/22/16 [place], MD 12/22/16 [phone]

## 2016-12-27 DIAGNOSIS — S335XXA Sprain of ligaments of lumbar spine, initial encounter: Secondary | ICD-10-CM | POA: Diagnosis not present

## 2017-01-08 ENCOUNTER — Other Ambulatory Visit: Payer: Self-pay

## 2017-01-08 ENCOUNTER — Other Ambulatory Visit: Payer: Medicare Other

## 2017-01-08 DIAGNOSIS — N183 Chronic kidney disease, stage 3 unspecified: Secondary | ICD-10-CM

## 2017-01-08 DIAGNOSIS — R739 Hyperglycemia, unspecified: Secondary | ICD-10-CM

## 2017-01-08 DIAGNOSIS — E782 Mixed hyperlipidemia: Secondary | ICD-10-CM

## 2017-01-08 DIAGNOSIS — I10 Essential (primary) hypertension: Secondary | ICD-10-CM

## 2017-01-09 DIAGNOSIS — J301 Allergic rhinitis due to pollen: Secondary | ICD-10-CM | POA: Diagnosis not present

## 2017-01-09 DIAGNOSIS — J343 Hypertrophy of nasal turbinates: Secondary | ICD-10-CM | POA: Diagnosis not present

## 2017-01-10 ENCOUNTER — Other Ambulatory Visit: Payer: Self-pay | Admitting: Internal Medicine

## 2017-01-10 ENCOUNTER — Ambulatory Visit (INDEPENDENT_AMBULATORY_CARE_PROVIDER_SITE_OTHER): Payer: Medicare Other | Admitting: Internal Medicine

## 2017-01-10 VITALS — BP 210/110 | HR 60 | Temp 97.5°F | Wt 188.0 lb

## 2017-01-10 DIAGNOSIS — J309 Allergic rhinitis, unspecified: Secondary | ICD-10-CM | POA: Diagnosis not present

## 2017-01-10 DIAGNOSIS — M545 Low back pain, unspecified: Secondary | ICD-10-CM

## 2017-01-10 DIAGNOSIS — N183 Chronic kidney disease, stage 3 (moderate): Secondary | ICD-10-CM | POA: Diagnosis not present

## 2017-01-10 DIAGNOSIS — I10 Essential (primary) hypertension: Secondary | ICD-10-CM

## 2017-01-10 DIAGNOSIS — R739 Hyperglycemia, unspecified: Secondary | ICD-10-CM | POA: Diagnosis not present

## 2017-01-10 DIAGNOSIS — E782 Mixed hyperlipidemia: Secondary | ICD-10-CM | POA: Diagnosis not present

## 2017-01-10 LAB — COMPLETE METABOLIC PANEL WITH GFR
ALT: 20 U/L (ref 9–46)
AST: 17 U/L (ref 10–35)
Albumin: 3.9 g/dL (ref 3.6–5.1)
Alkaline Phosphatase: 91 U/L (ref 40–115)
BUN: 18 mg/dL (ref 7–25)
CO2: 25 mmol/L (ref 20–31)
Calcium: 9.7 mg/dL (ref 8.6–10.3)
Chloride: 104 mmol/L (ref 98–110)
Creat: 1.38 mg/dL — ABNORMAL HIGH (ref 0.70–1.18)
GFR, Est African American: 57 mL/min — ABNORMAL LOW (ref >=60)
GFR, Est Non African American: 50 mL/min — ABNORMAL LOW (ref >=60)
Glucose, Bld: 109 mg/dL — ABNORMAL HIGH (ref 65–99)
Potassium: 4.2 mmol/L (ref 3.5–5.3)
Sodium: 137 mmol/L (ref 135–146)
Total Bilirubin: 0.6 mg/dL (ref 0.2–1.2)
Total Protein: 7.1 g/dL (ref 6.1–8.1)

## 2017-01-10 LAB — LIPID PANEL
Cholesterol: 169 mg/dL (ref <200)
HDL: 46 mg/dL (ref >40)
LDL Cholesterol: 77 mg/dL (ref <100)
Total CHOL/HDL Ratio: 3.7 Ratio (ref <5.0)
Triglycerides: 231 mg/dL — ABNORMAL HIGH (ref <150)
VLDL: 46 mg/dL — ABNORMAL HIGH (ref <30)

## 2017-01-10 MED ORDER — CLONIDINE HCL 0.1 MG PO TABS
0.1000 mg | ORAL_TABLET | Freq: Once | ORAL | Status: AC
  Administered 2017-01-10: 0.1 mg via ORAL

## 2017-01-10 NOTE — Progress Notes (Signed)
Location:  [facility] clinic Provider:  Darlyn Repsher L. Mariea Clonts, D.O., C.M.D.  Code Status: full code Goals of Care:  Advanced Directives 01/10/2017  Does Patient Have a Medical Advance Directive? No  Type of Advance Directive -  Does patient want to make changes to medical advance directive? -  Copy of [place] in Chart? -  Would patient like information on creating a medical advance directive? No - Patient declined  still has not done advance directive   Chief Complaint  Patient presents with  . Follow-up    2 week follow-up for blood pressure    HPI: Patient is a 76 y.o. male seen today for 2 week f/u on blood pressure.  This am, he is upset about having difficulty getting his bloodwork drawn yesterday.  He was late for his appt. Says he went to two doctors this weekend for back spasms and he was given the hydrocodone and advil from the sinus doctor.  He does report taking advil regularly and that may have affected his blood pressure.  He says he does not need the advil.  He is not going to take those anymore.  Back pain is better--muscle spasms and bone spurs per Dr.Murphy.  He says he has taken his medication faithfully.  He denies drinking ANY alcohol.  He had also been mobic daily also from Dr. Percell Miller (last one was 2 days ago).  Has been counseled every visit about avoiding nsaid meds and high sodium foods and adding salt.  When he saw ENT yesterday, he had allergic rhinitis, significant inferior turbinate hypertrophy.  nasonex and saline and allegra or claritin were recommended.  He did not want formal allergy testing.  Unfortunately, these will not help his bp either.    Past Medical History:  Diagnosis Date  . Allergy   . Cervicalgia 02/28/2016  . Chronic kidney disease   . Hypercholesteremia   . Hypertension   . Sinus drainage     Past Surgical History:  Procedure Laterality Date  . COLONOSCOPY  02/2009   Sessile polyp in the rectum. Otherwise normal examination    . EYE SURGERY  2013   Right eye cataract removed--Dr. Katy Fitch    No Known Allergies  Allergies as of 01/10/2017   No Known Allergies     Medication List       Accurate as of 01/10/17  8:43 AM. Always use your most recent med list.          amLODipine 10 MG tablet Commonly known as:  NORVASC TAKE 1 TABLET (10 MG TOTAL) BY MOUTH DAILY.   aspirin 81 MG chewable tablet Chew 81 mg by mouth daily.   fenofibrate 54 MG tablet TAKE 1 TABLET (54 MG TOTAL) BY MOUTH DAILY.   metoprolol 200 MG 24 hr tablet Commonly known as:  TOPROL-XL Take 1 tablet (200 mg total) by mouth daily.   mometasone 50 MCG/ACT nasal spray Commonly known as:  NASONEX Place 2 sprays into the nose daily.   pravastatin 40 MG tablet Commonly known as:  PRAVACHOL TAKE 1 TABLET EVERY DAY FOR CHOLESTEROL   silodosin 8 MG Caps capsule Commonly known as:  RAPAFLO Take 8 mg by mouth daily with breakfast. For prostate   solifenacin 5 MG tablet Commonly known as:  VESICARE Take 5 mg by mouth daily.       Review of Systems:  Review of Systems  Constitutional: Negative for chills and fever.  HENT: Positive for congestion and sinus pain.  Eyes: Negative for blurred vision.  Respiratory: Negative for cough and shortness of breath.   Cardiovascular: Negative for chest pain and palpitations.  Gastrointestinal: Negative for abdominal pain.  Genitourinary: Negative for dysuria.  Musculoskeletal: Positive for joint pain.  Neurological: Positive for headaches.  Psychiatric/Behavioral: Negative for depression and memory loss.    Health Maintenance  Topic Date Due  . INFLUENZA VACCINE  04/30/2017  . COLONOSCOPY  03/30/2019  . TETANUS/TDAP  09/10/2026  . PNA vac Low Risk Adult  Completed    Physical Exam: Vitals:   01/10/17 0831  BP: (!) 200/100  Pulse: 60  Temp: 97.5 F (36.4 C)  TempSrc: Oral  SpO2: 98%  Weight: 188 lb (85.3 kg)   Body mass index is 29.44 kg/m. Physical Exam  Constitutional:  He is oriented to person, place, and time. He appears well-developed and well-nourished. No distress.  HENT:  Swelling of eyes, tenderness of sinuses maxillary  Neck: Neck supple.  Cardiovascular: Normal rate, regular rhythm, normal heart sounds and intact distal pulses.   Pulmonary/Chest: Effort normal and breath sounds normal. No respiratory distress.  Musculoskeletal: Normal range of motion.  Lymphadenopathy:    He has no cervical adenopathy.  Neurological: He is alert and oriented to person, place, and time.  Skin: Skin is warm and dry. Capillary refill takes less than 2 seconds.  Psychiatric: He has a normal mood and affect.    Labs reviewed: Basic Metabolic Panel:  Recent Labs  09/06/16 0935  NA 138  K 3.9  CL 104  CO2 22  GLUCOSE 93  BUN 18  CREATININE 1.30*  CALCIUM 9.7   Liver Function Tests:  Recent Labs  09/06/16 0935  AST 17  ALT 16  ALKPHOS 86  BILITOT 0.7  PROT 7.5  ALBUMIN 4.1   No results for input(s): LIPASE, AMYLASE in the last 8760 hours. No results for input(s): AMMONIA in the last 8760 hours. CBC:  Recent Labs  09/06/16 0935  WBC 6.3  NEUTROABS 2,898  HGB 15.2  HCT 46.6  MCV 94.0  PLT 324   Lipid Panel:  Recent Labs  09/06/16 0935  CHOL 175  HDL 53  LDLCALC 90  TRIG 161*  CHOLHDL 3.3   Lab Results  Component Value Date   HGBA1C 5.4 10/04/2015    Procedures since last visit: Dg Lumbar Spine Complete  Result Date: 12/22/2016 CLINICAL DATA:  Pain following fall EXAM: LUMBAR SPINE - COMPLETE 4+ VIEW COMPARISON:  May 17, 2016. FINDINGS: Frontal, lateral, spot lumbosacral lateral, and bilateral oblique views were obtained. There are 5 non-rib-bearing lumbar type vertebral bodies. There is no fracture. There is stable 4 mm of retrolisthesis of L3 on L4. There is stable 3 mm of anterolisthesis of L4 on L5. No new spondylolisthesis. There is moderate disc space narrowing at L3-4, L4-5, L5-S1. There are anterior osteophytes in  the lower thoracic region as well as at L1, L3, and L4. There is facet osteoarthritic change at L3-4, L4-5, and L5-S1 bilaterally. IMPRESSION: No fracture. 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 Electronically Signed   By: Lowella Grip III M.D.   On: 12/22/2016 08:18    Assessment/Plan 1. Uncontrolled hypertension - not sure what else to do here - ruled out renal a stenosis - I've been upping his bp meds and adding to them for months and bp only getting worse -he's been extensively counseled about avoiding alcohol, nsaids, sudafed, salty foods and adding salt and tells me he  is behaving with all of this except nsaids a few days back but none today, and now some tx for his sinus infection and allergies - Ambulatory referral to Cardiology - cloNIDine (CATAPRES) tablet 0.1 mg; Take 1 tablet (0.1 mg total) by mouth once. Today due to extremely high bp--I was afraid to let him leave -did trend down  2. Allergic sinusitis -ongoing, saw ENT, try to avoid sudafed containing products that worsen bp  3. Acute midline low back pain without sciatica -avoid nsaids due to increased bp  4. Hyperglycemia -noted, f/u labs  Labs/tests ordered:   Orders Placed This Encounter  Procedures  . Ambulatory referral to Cardiology    Referral Priority:   Routine    Referral Type:   Consultation    Referral Reason:   Specialty Services Required    Requested Specialty:   Cardiology    Number of Visits Requested:   1   Next appt:  02/13/2017    Shanessa Hodak L. Sheng Pritz, D.O. [facility] [address], [place] [address] Cell Phone (Mon-Fri 8am-5pm):  [phone] On Call:  [phone] & follow prompts after 5pm & weekends Office Phone:  [phone] Office Fax:  [phone]

## 2017-01-11 LAB — HEMOGLOBIN A1C
Hgb A1c MFr Bld: 5.3 % (ref <5.7)
Mean Plasma Glucose: 105 mg/dL

## 2017-01-13 ENCOUNTER — Other Ambulatory Visit: Payer: Self-pay

## 2017-01-13 ENCOUNTER — Other Ambulatory Visit: Payer: Self-pay | Admitting: *Deleted

## 2017-01-13 DIAGNOSIS — I10 Essential (primary) hypertension: Secondary | ICD-10-CM

## 2017-01-13 MED ORDER — METOPROLOL SUCCINATE ER 200 MG PO TB24: 200.0000 mg | ORAL_TABLET | Freq: Every day | ORAL | 1 refills | Status: DC

## 2017-01-13 MED ORDER — PRAVASTATIN SODIUM 40 MG PO TABS: ORAL_TABLET | ORAL | 1 refills | Status: DC

## 2017-01-13 NOTE — Telephone Encounter (Signed)
[facility] 

## 2017-01-14 ENCOUNTER — Encounter: Payer: Self-pay | Admitting: *Deleted

## 2017-01-14 ENCOUNTER — Encounter: Payer: Self-pay | Admitting: Physician Assistant

## 2017-01-14 ENCOUNTER — Ambulatory Visit (INDEPENDENT_AMBULATORY_CARE_PROVIDER_SITE_OTHER): Payer: Medicare Other | Admitting: Physician Assistant

## 2017-01-14 VITALS — BP 180/84 | HR 59 | Ht 67.0 in | Wt 188.8 lb

## 2017-01-14 DIAGNOSIS — R0989 Other specified symptoms and signs involving the circulatory and respiratory systems: Secondary | ICD-10-CM

## 2017-01-14 DIAGNOSIS — N183 Chronic kidney disease, stage 3 unspecified: Secondary | ICD-10-CM

## 2017-01-14 DIAGNOSIS — I10 Essential (primary) hypertension: Secondary | ICD-10-CM | POA: Diagnosis not present

## 2017-01-14 DIAGNOSIS — E782 Mixed hyperlipidemia: Secondary | ICD-10-CM

## 2017-01-14 DIAGNOSIS — R0683 Snoring: Secondary | ICD-10-CM

## 2017-01-14 MED ORDER — LISINOPRIL 10 MG PO TABS: 10.0000 mg | ORAL_TABLET | Freq: Every day | ORAL | 3 refills | Status: DC

## 2017-01-14 NOTE — Patient Instructions (Addendum)
Medication Instructions:  1. START LISINOPRIL 10 MG DAILY  Labwork: IN 1 WEEK YOU WILL NEED BMET TO BE DONE DUE TO NEW START OF LISINOPRIL  Testing/Procedures: 1. Your physician has requested that you have a carotid duplex. This test is an ultrasound of the carotid arteries in your neck. It looks at blood flow through these arteries that supply the brain with blood. Allow one hour for this exam. There are no restrictions or special instructions.  2. YOU ARE NEEDING TO BE SET UP FOR AN OVERNIGHT PULSE OXIMETRY TEST. NINA JONES, CMA FROM OUR OFFICE WILL CALL YOU TO SET THIS UP.  Follow-Up: [facility], [facility] IN 1 MONTH  Any Other Special Instructions Will Be Listed Below (If Applicable).  If you need a refill on your cardiac medications before your next appointment, please call your pharmacy.   Low-Sodium Eating Plan Sodium, which is an element that makes up salt, helps you maintain a healthy balance of fluids in your body. Too much sodium can increase your blood pressure and cause fluid and waste to be held in your body. Your health care provider or dietitian may recommend following this plan if you have high blood pressure (hypertension), kidney disease, liver disease, or heart failure. Eating less sodium can help lower your blood pressure, reduce swelling, and protect your heart, liver, and kidneys. What are tips for following this plan? General guidelines   Most people on this plan should limit their sodium intake to 1,500-2,000 mg (milligrams) of sodium each day. Reading food labels   The Nutrition Facts label lists the amount of sodium in one serving of the food. If you eat more than one serving, you must multiply the listed amount of sodium by the number of servings.  Choose foods with less than 140 mg of sodium per serving.  -Avoid foods with 300 mg of sodium or more per serving. Shopping   Look for lower-sodium products, often labeled as "low-sodium" or "no salt  added."  Always check the sodium content even if foods are labeled as "unsalted" or "no salt added".  Buy fresh foods.  Avoid canned foods and premade or frozen meals.  Avoid canned, cured, or processed meats  Buy breads that have less than 80 mg of sodium per slice. Cooking   Eat more home-cooked food and less restaurant, buffet, and fast food.  Avoid adding salt when cooking. Use salt-free seasonings or herbs instead of table salt or sea salt. Check with your health care provider or pharmacist before using salt substitutes.  Cook with plant-based oils, such as canola, sunflower, or olive oil. Meal planning   When eating at a restaurant, ask that your food be prepared with less salt or no salt, if possible.  Avoid foods that contain MSG (monosodium glutamate). MSG is sometimes added to [place] food, bouillon, and some canned foods. What foods are recommended? The items listed may not be a complete list. Talk with your dietitian about what dietary choices are best for you. Grains  Low-sodium cereals, including oats, puffed wheat and rice, and shredded wheat. Low-sodium crackers. Unsalted rice. Unsalted pasta. Low-sodium bread. Whole-grain breads and whole-grain pasta. Vegetables  Fresh or frozen vegetables. "No salt added" canned vegetables. "No salt added" tomato sauce and paste. Low-sodium or reduced-sodium tomato and vegetable juice. Fruits  Fresh, frozen, or canned fruit. Fruit juice. Meats and other protein foods  Fresh or frozen (no salt added) meat, poultry, seafood, and fish. Low-sodium canned tuna and salmon. Unsalted nuts. Dried peas,  beans, and lentils without added salt. Unsalted canned beans. Eggs. Unsalted nut butters. Dairy  Milk. Soy milk. Cheese that is naturally low in sodium, such as ricotta cheese, fresh mozzarella, or Swiss cheese Low-sodium or reduced-sodium cheese. Cream cheese. Yogurt. Fats and oils  Unsalted butter. Unsalted margarine with no trans fat.  Vegetable oils such as canola or olive oils. Seasonings and other foods  Fresh and dried herbs and spices. Salt-free seasonings. Low-sodium mustard and ketchup. Sodium-free salad dressing. Sodium-free light mayonnaise. Fresh or refrigerated horseradish. Lemon juice. Vinegar. Homemade, reduced-sodium, or low-sodium soups. Unsalted popcorn and pretzels. Low-salt or salt-free chips. What foods are not recommended? The items listed may not be a complete list. Talk with your dietitian about what dietary choices are best for you. Grains  Instant hot cereals. Bread stuffing, pancake, and biscuit mixes. Croutons. Seasoned rice or pasta mixes. Noodle soup cups. Boxed or frozen macaroni and cheese. Regular salted crackers. Self-rising flour. Vegetables  Sauerkraut, pickled vegetables, and relishes. Olives. [place] fries. Onion rings. Regular canned vegetables (not low-sodium or reduced-sodium). Regular canned tomato sauce and paste (not low-sodium or reduced-sodium). Regular tomato and vegetable juice (not low-sodium or reduced-sodium). Frozen vegetables in sauces. Meats and other protein foods  Meat or fish that is salted, canned, smoked, spiced, or pickled. Bacon, ham, sausage, hotdogs, corned beef, chipped beef, packaged lunch meats, salt pork, jerky, pickled herring, anchovies, regular canned tuna, sardines, salted nuts. Dairy  Processed cheese and cheese spreads. Cheese curds. Blue cheese. Feta cheese. String cheese. Regular cottage cheese. Buttermilk. Canned milk. Fats and oils  Salted butter. Regular margarine. Ghee. Bacon fat. Seasonings and other foods  Onion salt, garlic salt, seasoned salt, table salt, and sea salt. Canned and packaged gravies. Worcestershire sauce. Tartar sauce. Barbecue sauce. Teriyaki sauce. Soy sauce, including reduced-sodium. Steak sauce. Fish sauce. Oyster sauce. Cocktail sauce. Horseradish that you find on the shelf. Regular ketchup and mustard. Meat flavorings and  tenderizers. Bouillon cubes. Hot sauce and Tabasco sauce. Premade or packaged marinades. Premade or packaged taco seasonings. Relishes. Regular salad dressings. Salsa. Potato and tortilla chips. Corn chips and puffs. Salted popcorn and pretzels. Canned or dried soups. Pizza. Frozen entrees and pot pies. Summary  Eating less sodium can help lower your blood pressure, reduce swelling, and protect your heart, liver, and kidneys.  Most people on this plan should limit their sodium intake to 1,500-2,000 mg (milligrams) of sodium each day.  Canned, boxed, and frozen foods are high in sodium. Restaurant foods, fast foods, and pizza are also very high in sodium. You also get sodium by adding salt to food.  Try to cook at home, eat more fresh fruits and vegetables, and eat less fast food, canned, processed, or prepared foods. This information is not intended to replace advice given to you by your health care provider. Make sure you discuss any questions you have with your health care provider. Document Released: 03/08/2002 Document Revised: 09/09/2016 Document Reviewed: 09/09/2016 Elsevier Interactive Patient Education  2017 [employer].

## 2017-01-14 NOTE — Progress Notes (Signed)
Cardiology Office Note:    Date:  01/14/2017   ID:  Virgina Lowery, DOB [birth year], MRN [id number]  PCP:  Hollace Kinnier, DO  Cardiologist:  [facility] - Dr. Casandra Doffing / Richardson Dopp, PA-C   Electrophysiologist:  n/a Urologist: [facility]  Referring MD: Gayland Curry, DO   Chief Complaint  Patient presents with  . New Evaluation    Uncontrolled hypertension    History of Present Illness:    Douglas Lowery is a 76 y.o. male who is being seen today for the evaluation of hypertension at the request of Gayland Curry, DO    [name] has a hx of CKD, HTN, HL, BPH.  He has had HTN for most of his life.  It has gradually increased over the past 20 years.  But, over the last several mos it has increased more.  He denies any hx of chest pain or dyspnea on exertion. He denies orthopnea, PND, edema.  He denies syncope or dizziness.  He does snore.  He does not have a hx of witnessed apnea.  He denies ever smoking. He does eat out a lot and admits to a diet high in salt.   PAD Screen 01/14/2017  Previous PAD dx? No  Previous surgical procedure? No  Pain with walking? No  Feet/toe relief with dangling? No  Painful, non-healing ulcers? No  Extremities discolored? No    Prior CV studies:   The following studies were reviewed today:  Renal Art [place] 2/17 IMPRESSION: 1. No Doppler suggestion of hemodynamically significant renal artery stenosis or other vascular lesion. If there is continued clinical concern, renal MRA (lower radiation risk, can be performed noncontrast in the setting of renal dysfunction) and CTA ( higher spatial resolution) represent more accurate studies, which are additionally more sensitive to the detection of duplicated renal arteries. 2. Benign-appearing renal cysts   Past Medical History:  Diagnosis Date  . Allergic rhinitis    seasonal  . BPH (benign prostatic hyperplasia)   . Cervicalgia 02/28/2016  . Chronic kidney disease   . Hypercholesteremia   .  Hypertension     Past Surgical History:  Procedure Laterality Date  . COLONOSCOPY  02/2009   Sessile polyp in the rectum. Otherwise normal examination  . EYE SURGERY  2013   Right eye cataract removed--Dr. Katy Fitch    Current Medications: Current Meds  Medication Sig  . amLODipine (NORVASC) 10 MG tablet TAKE 1 TABLET (10 MG TOTAL) BY MOUTH DAILY.  Marland Kitchen aspirin 81 MG chewable tablet Chew 81 mg by mouth daily.  . fenofibrate 54 MG tablet TAKE 1 TABLET (54 MG TOTAL) BY MOUTH DAILY.  . metoprolol (TOPROL-XL) 200 MG 24 hr tablet Take 1 tablet (200 mg total) by mouth daily.  . mometasone (NASONEX) 50 MCG/ACT nasal spray Place 2 sprays into the nose daily.  . pravastatin (PRAVACHOL) 40 MG tablet Take one tablet by mouth once daily for cholesterol  . silodosin (RAPAFLO) 8 MG CAPS capsule Take 8 mg by mouth daily with breakfast. For prostate  . solifenacin (VESICARE) 5 MG tablet Take 5 mg by mouth daily.     Allergies:   Patient has no known allergies.   Social History   Social History  . Marital status: Single    Spouse name: N/A  . Number of children: N/A  . Years of education: N/A   Occupational History  . Retired    Social History Main Topics  . Smoking status: Never Smoker  .  Smokeless tobacco: Never Used  . Alcohol use Yes     Comment: 40 oz beer every week  . Drug use: No  . Sexual activity: Yes   Other Topics Concern  . 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   Single   4 children; 6 grandchildren     Family History  Problem Relation Age of Onset  . Pneumonia Father   . Alzheimer's disease Sister   . Hypertension Sister   . Hypertension Sister   . Hypertension Sister   . Heart attack Neg Hx      ROS:   Please see the history of present illness.    Review of Systems  Respiratory: Positive for cough and snoring.   Musculoskeletal: Positive for back pain.   All other systems reviewed and are  negative.   EKGs/Labs/Other Test Reviewed:    EKG:  EKG is  ordered today.  The ekg ordered today demonstrates Sinus bradycardia, HR 59, normal axis, septal Q waves, nonspecific ST-T wave changes, PR interval 192 ms, QTC 419 ms, QRS 86 ms  Recent Labs: 09/06/2016: Hemoglobin 15.2; Platelets 324 01/10/2017: ALT 20; BUN 18; Creat 1.38; Potassium 4.2; Sodium 137   Recent Lipid Panel    Component Value Date/Time   CHOL 169 01/10/2017 0950   CHOL 191 10/04/2015 0950   TRIG 231 (H) 01/10/2017 0950   HDL 46 01/10/2017 0950   HDL 64 10/04/2015 0950   CHOLHDL 3.7 01/10/2017 0950   VLDL 46 (H) 01/10/2017 0950   LDLCALC 77 01/10/2017 0950   LDLCALC 102 (H) 10/04/2015 0950     Physical Exam:    VS:  BP (!) 180/84 (BP Location: Left Arm, Patient Position: Sitting, Cuff Size: Normal)   Pulse (!) 59   Ht 5\' 7"  (1.702 m)   Wt 188 lb 12.8 oz (85.6 kg)   BMI 29.57 kg/m     Wt Readings from Last 3 Encounters:  01/14/17 188 lb 12.8 oz (85.6 kg)  01/10/17 188 lb (85.3 kg)  12/22/16 190 lb (86.2 kg)     Physical Exam  Constitutional: He is oriented to person, place, and time. He appears well-developed and well-nourished. No distress.  HENT:  Head: Normocephalic and atraumatic.  Eyes: No scleral icterus.  Neck: Normal range of motion. No JVD present. Carotid bruit is present (?R carotid bruit; no supraclavicular bruits bilaterally).  Cardiovascular: Normal rate, regular rhythm, S1 normal and S2 normal.   No murmur heard. Pulses:      Dorsalis pedis pulses are 2+ on the right side, and 2+ on the left side.       Posterior tibial pulses are 2+ on the right side, and 2+ on the left side.  Pulmonary/Chest: Effort normal and breath sounds normal. He has no wheezes. He has no rhonchi. He has no rales.  Abdominal: Soft. There is no tenderness.  Musculoskeletal: He exhibits no edema.  Neurological: He is alert and oriented to person, place, and time.  Skin: Skin is warm and dry.  Psychiatric:  He has a normal mood and affect.    ASSESSMENT:    1. Essential hypertension, benign   2. Mixed hyperlipidemia   3. Chronic kidney disease, stage 3   4. Right carotid bruit   5. Snoring    PLAN:    In order of problems listed above:  1. Essential hypertension, benign - He has a long hx of HTN that is  now difficult to control.  Renal artery dopplers were unremarkable last year.  He has no symptoms to suggest secondary causes of HTN.  He does have a hx of snoring and he does admit to a diet high in salt.  He has CKD with baseline creatinine of ~ 1.3.  He would benefit from ACE inhibitor or angiotensin receptor blocker therapy.   -  Start Lisinopril 10 mg QD  -  BMET 1 week  -  Continue Norvasc 10 and Toprol 200  -  Arrange overnight oximetry to screen for OSA  -  Low salt diet  2. Mixed hyperlipidemia - Managed by PCP.  Continue statin.   3. Chronic kidney disease, stage 3 - He would benefit form renal protection of ACE inhibitor or angiotensin receptor blocker.  Add Lisinopril as noted.  Will keep close eye on renal function and potassium.   4. Right carotid bruit - ? Bruit on the R.  He has a mild BP discrepancy b/t arms.  No steal symptoms noted.  Will get Carotid US.  5. Snoring - Arrange overnight oximetry.    Dispo:  Return in about 4 weeks (around 02/11/2017) for Routine Follow Up, w/ Richardson Dopp, PA-C.   Medication Adjustments/Labs and Tests Ordered: Current medicines are reviewed at length with the patient today.  Concerns regarding medicines are outlined above.  Medication changes, Labs and Tests ordered today are outlined in the Patient Instructions noted below. Patient Instructions  Medication Instructions:  1. START LISINOPRIL 10 MG DAILY  Labwork: IN 1 WEEK YOU WILL NEED BMET TO BE DONE DUE TO NEW START OF LISINOPRIL  Testing/Procedures: 1. Your physician has requested that you have a carotid duplex. This test is an ultrasound of the carotid arteries in  your neck. It looks at blood flow through these arteries that supply the brain with blood. Allow one hour for this exam. There are no restrictions or special instructions.  2. YOU ARE NEEDING TO BE SET UP FOR AN OVERNIGHT PULSE OXIMETRY TEST. NINA JONES, CMA FROM OUR OFFICE WILL CALL YOU TO SET THIS UP.  Follow-Up: [facility], [facility] IN 1 MONTH  Any Other Special Instructions Will Be Listed Below (If Applicable).  If you need a refill on your cardiac medications before your next appointment, please call your pharmacy.  Signed, Richardson Dopp, PA-C  01/14/2017 4:50 PM    [facility] [address], [place], [place]  [address] Phone: [phone]; Fax: [phone]   I have examined the patient and reviewed assessment and plan and discussed with patient.  Agree with above as stated.  Try for more aggressive BP control with meds and salt restriction.  Larae Grooms

## 2017-01-19 ENCOUNTER — Encounter: Payer: Self-pay | Admitting: Internal Medicine

## 2017-01-21 ENCOUNTER — Other Ambulatory Visit: Payer: Medicare Other | Admitting: *Deleted

## 2017-01-21 DIAGNOSIS — I10 Essential (primary) hypertension: Secondary | ICD-10-CM

## 2017-01-21 LAB — BASIC METABOLIC PANEL WITH GFR
BUN/Creatinine Ratio: 12 (ref 10–24)
BUN: 15 mg/dL (ref 8–27)
CO2: 22 mmol/L (ref 18–29)
Calcium: 9.9 mg/dL (ref 8.6–10.2)
Chloride: 100 mmol/L (ref 96–106)
Creatinine, Ser: 1.21 mg/dL (ref 0.76–1.27)
GFR calc Af Amer: 67 mL/min/1.73
GFR calc non Af Amer: 58 mL/min/1.73 — ABNORMAL LOW
Glucose: 121 mg/dL — ABNORMAL HIGH (ref 65–99)
Potassium: 4.8 mmol/L (ref 3.5–5.2)
Sodium: 140 mmol/L (ref 134–144)

## 2017-01-22 ENCOUNTER — Telehealth: Payer: Self-pay | Admitting: *Deleted

## 2017-01-22 NOTE — Telephone Encounter (Signed)
-----   Message from Liliane Shi, [place] sent at 01/22/2017  9:08 AM EDT ----- Please call patient: The kidney function (BUN, Creatinine) and potassium are normal. All other parameters are within acceptable limits and no further intervention or testing required. Continue with current treatment plan. Richardson Dopp, PA-C    01/22/2017 9:08 AM

## 2017-01-22 NOTE — Telephone Encounter (Signed)
Pt notified of lab results by phone with verbal understanding. Pt confirmed his Carotid appt at [facility] office on 5/4 then [place] 5/18.

## 2017-01-22 NOTE — Progress Notes (Signed)
Pt notified of lab results by phone with verbal understanding. Pt confirmed his Carotid appt at [facility] office on 5/4 then [place] 5/18.

## 2017-01-28 ENCOUNTER — Encounter: Payer: Self-pay | Admitting: Physician Assistant

## 2017-01-31 ENCOUNTER — Ambulatory Visit (HOSPITAL_COMMUNITY)
Admission: RE | Admit: 2017-01-31 | Discharge: 2017-01-31 | Disposition: A | Discharge status: Home / Self Care | Payer: Medicare Other | Source: Ambulatory Visit | Attending: Cardiovascular Disease | Admitting: Cardiovascular Disease

## 2017-01-31 DIAGNOSIS — I6521 Occlusion and stenosis of right carotid artery: Secondary | ICD-10-CM | POA: Insufficient documentation

## 2017-01-31 DIAGNOSIS — R0989 Other specified symptoms and signs involving the circulatory and respiratory systems: Secondary | ICD-10-CM | POA: Insufficient documentation

## 2017-02-02 ENCOUNTER — Encounter: Payer: Self-pay | Admitting: Physician Assistant

## 2017-02-07 ENCOUNTER — Encounter: Payer: Self-pay | Admitting: Physician Assistant

## 2017-02-13 ENCOUNTER — Ambulatory Visit (INDEPENDENT_AMBULATORY_CARE_PROVIDER_SITE_OTHER): Payer: Medicare Other | Admitting: Internal Medicine

## 2017-02-13 ENCOUNTER — Encounter: Payer: Self-pay | Admitting: Physician Assistant

## 2017-02-13 ENCOUNTER — Encounter: Payer: Self-pay | Admitting: Internal Medicine

## 2017-02-13 VITALS — BP 160/90 | HR 57 | Temp 98.0°F | Wt 189.0 lb

## 2017-02-13 DIAGNOSIS — H4030X0 Glaucoma secondary to eye trauma, unspecified eye, stage unspecified: Secondary | ICD-10-CM | POA: Diagnosis not present

## 2017-02-13 DIAGNOSIS — N183 Chronic kidney disease, stage 3 unspecified: Secondary | ICD-10-CM

## 2017-02-13 DIAGNOSIS — R739 Hyperglycemia, unspecified: Secondary | ICD-10-CM

## 2017-02-13 DIAGNOSIS — E663 Overweight: Secondary | ICD-10-CM

## 2017-02-13 DIAGNOSIS — E782 Mixed hyperlipidemia: Secondary | ICD-10-CM | POA: Diagnosis not present

## 2017-02-13 DIAGNOSIS — I10 Essential (primary) hypertension: Secondary | ICD-10-CM | POA: Diagnosis not present

## 2017-02-13 DIAGNOSIS — S0502XA Injury of conjunctiva and corneal abrasion without foreign body, left eye, initial encounter: Secondary | ICD-10-CM | POA: Diagnosis not present

## 2017-02-13 DIAGNOSIS — H35352 Cystoid macular degeneration, left eye: Secondary | ICD-10-CM | POA: Diagnosis not present

## 2017-02-13 DIAGNOSIS — H35011 Changes in retinal vascular appearance, right eye: Secondary | ICD-10-CM | POA: Diagnosis not present

## 2017-02-13 NOTE — Progress Notes (Signed)
Location:  [facility] clinic Provider:  Reichen Hutzler L. Mariea Clonts, D.O., C.M.D.  Code Status: Full code Goals of Care:  Advanced Directives 02/13/2017  Does Patient Have a Medical Advance Directive? No  Type of Advance Directive -  Does patient want to make changes to medical advance directive? No - Patient declined  Copy of [place] in Chart? -  Would patient like information on creating a medical advance directive? -   Chief Complaint  Patient presents with  . Medical Management of Chronic Issues    6 week follow-up for BP    HPI: Patient is a 76 y.o. male seen today for medical management of chronic diseases.    BP still elevated.  He had lab work with [place].  He returns tomorrow to f/u on that.  Is on lisinopril 10mg  daily now.  He was counseled on low sodium diet.  He is looking at labels on cans and getting low sodium foods, rinsing veggies.  At [facility] it was in the 120s to 140s.    He had a carotid doppler done which was not very remarkable.   He does snore he thinks, but doesn't have anyone to listen to tell him.  They are going to arrange overnight pulse oximetry thru cardiology.    He wants to ask cardiology before increasing the lisinopril as I suggested today.  His renal function actually improved after the ace was added.      Past Medical History:  Diagnosis Date  . Allergic rhinitis    seasonal  . BPH (benign prostatic hyperplasia)   . Carotid bruit    Carotid US 5/18:  1-39% RICA stenosis; Prominent R subclavian artery, measuring 1.4 cm AP x 1.5 cm TRV. - FU prn  . Cervicalgia 02/28/2016  . Chronic kidney disease   . Hypercholesteremia   . Hypertension     Past Surgical History:  Procedure Laterality Date  . COLONOSCOPY  02/2009   Sessile polyp in the rectum. Otherwise normal examination  . EYE SURGERY  2013   Right eye cataract removed--Dr. Katy Fitch    No Known Allergies  Allergies as of 02/13/2017   No Known Allergies     Medication List       Accurate as of 02/13/17  8:27 AM. Always use your most recent med list.          amLODipine 10 MG tablet Commonly known as:  NORVASC TAKE 1 TABLET (10 MG TOTAL) BY MOUTH DAILY.   aspirin 81 MG chewable tablet Chew 81 mg by mouth daily.   fenofibrate 54 MG tablet TAKE 1 TABLET (54 MG TOTAL) BY MOUTH DAILY.   lisinopril 10 MG tablet Commonly known as:  PRINIVIL,ZESTRIL Take 1 tablet (10 mg total) by mouth daily.   metoprolol 200 MG 24 hr tablet Commonly known as:  TOPROL-XL Take 1 tablet (200 mg total) by mouth daily.   mometasone 50 MCG/ACT nasal spray Commonly known as:  NASONEX Place 2 sprays into the nose daily.   pravastatin 40 MG tablet Commonly known as:  PRAVACHOL Take one tablet by mouth once daily for cholesterol   silodosin 8 MG Caps capsule Commonly known as:  RAPAFLO Take 8 mg by mouth daily with breakfast. For prostate   solifenacin 5 MG tablet Commonly known as:  VESICARE Take 5 mg by mouth daily.       Review of Systems:  Review of Systems  Constitutional: Negative for chills, fever and malaise/fatigue.  HENT: Negative for  congestion.   Eyes: Negative for blurred vision.  Respiratory: Negative for shortness of breath.   Cardiovascular: Negative for chest pain, palpitations and leg swelling.  Gastrointestinal: Negative for abdominal pain, blood in stool, constipation and melena.  Genitourinary: Negative for dysuria.  Musculoskeletal: Negative for falls.  Neurological: Negative for dizziness and loss of consciousness.  Psychiatric/Behavioral: Negative for depression and memory loss.    Health Maintenance  Topic Date Due  . INFLUENZA VACCINE  04/30/2017  . COLONOSCOPY  03/30/2019  . TETANUS/TDAP  09/10/2026  . PNA vac Low Risk Adult  Completed    Physical Exam: Vitals:   02/13/17 0809  BP: (!) 160/90  Pulse: (!) 57  Temp: 98 F (36.7 C)  TempSrc: Oral  SpO2: 98%  Weight: 189 lb (85.7 kg)   Body mass index is 29.6 kg/m. Physical  Exam  Constitutional: He is oriented to person, place, and time. He appears well-developed and well-nourished. No distress.  Cardiovascular: Normal rate, regular rhythm, normal heart sounds and intact distal pulses.   Pulmonary/Chest: Effort normal and breath sounds normal. No respiratory distress.  Abdominal: Bowel sounds are normal.  Musculoskeletal: Normal range of motion.  Neurological: He is alert and oriented to person, place, and time.  Skin: Skin is warm and dry.  Psychiatric: He has a normal mood and affect.   Labs reviewed: Basic Metabolic Panel:  Recent Labs  09/06/16 0935 01/10/17 0950 01/21/17 0852  NA 138 137 140  K 3.9 4.2 4.8  CL 104 104 100  CO2 22 25 22   GLUCOSE 93 109* 121*  BUN 18 18 15   CREATININE 1.30* 1.38* 1.21  CALCIUM 9.7 9.7 9.9   Liver Function Tests:  Recent Labs  09/06/16 0935 01/10/17 0950  AST 17 17  ALT 16 20  ALKPHOS 86 91  BILITOT 0.7 0.6  PROT 7.5 7.1  ALBUMIN 4.1 3.9   No results for input(s): LIPASE, AMYLASE in the last 8760 hours. No results for input(s): AMMONIA in the last 8760 hours. CBC:  Recent Labs  09/06/16 0935  WBC 6.3  NEUTROABS 2,898  HGB 15.2  HCT 46.6  MCV 94.0  PLT 324   Lipid Panel:  Recent Labs  09/06/16 0935 01/10/17 0950  CHOL 175 169  HDL 53 46  LDLCALC 90 77  TRIG 161* 231*  CHOLHDL 3.3 3.7   Lab Results  Component Value Date   HGBA1C 5.3 01/10/2017   Reviewed carotid doppler, cardiology note, labs  Assessment/Plan 1. Uncontrolled hypertension -improved but still not at goal -recommended increasing lisinopril to 20mg , but pt wants to wait and ask PA Kathlen Mody tomorrow before making changes -is doing better about not adding sodium and watching his nutrition facts -await results of sleep study when done--agree this is certainly a possibility that I had not thought about this time  2. Chronic kidney disease, stage 3 -improved with lisinopril -cont to monitor (does not like getting  labs drawn here so will allow him to get labs at cardiology)  3. Hyperglycemia -last hba1c in normal range, cont to work on diet and exercise  4. Mixed hyperlipidemia -LDL decreased to 77 which is improved, but TG remain very high, cont to monitor  5. Overweight (BMI 25.0-29.9) -encouraged weight loss and healthier diet as discussed each visit  Labs/tests ordered:  No orders of the defined types were placed in this encounter.  Next appt:  05/19/2017   Pao Haffey L. Ashleigh Luckow, D.O. [facility] [address]  [place], [place] [address] Cell Phone (Mon-Fri 8am-5pm):  [phone] On Call:  [phone] & follow prompts after 5pm & weekends Office Phone:  [phone] Office Fax:  [phone]

## 2017-02-13 NOTE — Progress Notes (Signed)
Cardiology Office Note:    Date:  02/14/2017   ID:  Douglas Lowery, DOB Dec 01, 1940, MRN [id number]  PCP:  Gayland Curry, DO  Cardiologist:  Dr. Casandra Doffing / Richardson Dopp, PA-C    Referring MD: Gayland Curry, DO   Chief Complaint  Patient presents with  . Follow-up    HTN    History of Present Illness:    Douglas Lowery is a 76 y.o. male with a hx of HTN, HL ,CKD, BPH.  He was evaluated by me and Dr. Casandra Doffing in 4/18 for uncontrolled blood pressure.  He returns for Cardiology follow up.    He is here alone.  He denies chest pain, shortness of breath, syncope, orthopnea, PND or significant pedal edema.   Prior CV studies:   The following studies were reviewed today:  Carotid US 5/18:   1-39% RICA stenosis; Prominent R subclavian artery, measuring 1.4 cm AP x 1.5 cm . - FU prn  Renal Art [place] 2/17 No significant RA stenosis  Past Medical History:  Diagnosis Date  . Allergic rhinitis    seasonal  . BPH (benign prostatic hyperplasia)   . Carotid bruit    Carotid US 5/18:  1-39% RICA stenosis; Prominent R subclavian artery, measuring 1.4 cm AP x 1.5 cm TRV. - FU prn  . Cervicalgia 02/28/2016  . Chronic kidney disease   . Hypercholesteremia   . Hypertension     Past Surgical History:  Procedure Laterality Date  . COLONOSCOPY  02/2009   Sessile polyp in the rectum. Otherwise normal examination  . EYE SURGERY  2013   Right eye cataract removed--Dr. Katy Fitch    Current Medications: Current Meds  Medication Sig  . amLODipine (NORVASC) 10 MG tablet TAKE 1 TABLET (10 MG TOTAL) BY MOUTH DAILY.  Marland Kitchen aspirin 81 MG chewable tablet Chew 81 mg by mouth daily.  . fenofibrate 54 MG tablet TAKE 1 TABLET (54 MG TOTAL) BY MOUTH DAILY.  . metoprolol (TOPROL-XL) 200 MG 24 hr tablet Take 1 tablet (200 mg total) by mouth daily.  . mometasone (NASONEX) 50 MCG/ACT nasal spray Place 2 sprays into the nose daily.  . pravastatin (PRAVACHOL) 40 MG tablet Take one tablet by mouth once  daily for cholesterol  . silodosin (RAPAFLO) 8 MG CAPS capsule Take 8 mg by mouth daily with breakfast. For prostate  . solifenacin (VESICARE) 5 MG tablet Take 5 mg by mouth daily.  . [DISCONTINUED] lisinopril (PRINIVIL,ZESTRIL) 10 MG tablet Take 1 tablet (10 mg total) by mouth daily.     Allergies:   Patient has no known allergies.   Social History   Social History  . Marital status: Single    Spouse name: N/A  . Number of children: N/A  . Years of education: N/A   Occupational History  . Retired    Social History Main Topics  . Smoking status: Never Smoker  . Smokeless tobacco: Never Used  . Alcohol use Yes     Comment: 40 oz beer every week  . Drug use: No  . Sexual activity: Yes   Other Topics Concern  . 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   Single   4 children; 6 grandchildren     Family Hx: The patient's family history includes Alzheimer's disease in his sister; Hypertension in his sister, sister, and sister; Pneumonia in his father. There is no history of Heart attack.  ROS:   Please see the history of present illness.    Review of Systems  Respiratory: Positive for cough.    All other systems reviewed and are negative.   EKGs/Labs/Other Test Reviewed:    EKG:  EKG is not ordered today.  The ekg ordered today demonstrates n/a  Recent Labs: 09/06/2016: Hemoglobin 15.2; Platelets 324 01/10/2017: ALT 20 01/21/2017: BUN 15; Creatinine, Ser 1.21; Potassium 4.8; Sodium 140   Recent Lipid Panel    Component Value Date/Time   CHOL 169 01/10/2017 0950   CHOL 191 10/04/2015 0950   TRIG 231 (H) 01/10/2017 0950   HDL 46 01/10/2017 0950   HDL 64 10/04/2015 0950   CHOLHDL 3.7 01/10/2017 0950   VLDL 46 (H) 01/10/2017 0950   LDLCALC 77 01/10/2017 0950   LDLCALC 102 (H) 10/04/2015 0950     Physical Exam:    VS:  BP (!) 150/78   Pulse 64   Ht 5\' 7"  (1.702 m)   Wt 187 lb 1.9 oz (84.9 kg)   BMI 29.31 kg/m      Wt Readings from Last 3 Encounters:  02/14/17 187 lb 1.9 oz (84.9 kg)  02/13/17 189 lb (85.7 kg)  01/14/17 188 lb 12.8 oz (85.6 kg)     Physical Exam  Constitutional: He is oriented to person, place, and time. He appears well-developed and well-nourished. No distress.  HENT:  Head: Normocephalic and atraumatic.  Eyes: No scleral icterus.  Neck: Normal range of motion. No JVD present.  Cardiovascular: Normal rate, regular rhythm, S1 normal and S2 normal.   No murmur heard. Pulmonary/Chest: Effort normal and breath sounds normal. He has no wheezes. He has no rhonchi. He has no rales.  Abdominal: Soft. There is no tenderness.  Musculoskeletal: He exhibits no edema.  Neurological: He is alert and oriented to person, place, and time.  Skin: Skin is warm and dry.  Psychiatric: He has a normal mood and affect.    ASSESSMENT:    1. Essential hypertension, benign   2. Chronic kidney disease, stage 3    PLAN:    In order of problems listed above:  1. Essential hypertension, benign - BP much better controlled.  Will increase Lisinopril to 20 mg QD. Check BMET 2 weeks.  Continue Amlodipine, Metoprolol.    2. Chronic kidney disease, stage 3 - SCr stable on recent lab work.  Repeat BMET in 2 weeks to monitor renal function with adjustment in ACE inhibitor.   Dispo:  Return in about 6 months (around 08/17/2017) for Routine Follow Up, w/ Richardson Dopp, PA-C.   Medication Adjustments/Labs and Tests Ordered: Current medicines are reviewed at length with the patient today.  Concerns regarding medicines are outlined above.  Orders/Tests:  Orders Placed This Encounter  Procedures  . Basic metabolic panel   Medication changes: Meds ordered this encounter  Medications  . lisinopril (PRINIVIL,ZESTRIL) 20 MG tablet    Sig: Take 1 tablet (20 mg total) by mouth daily.    Dispense:  30 tablet    Refill:  11    Dose Increase    Order Specific Question:   Supervising Provider    Answer:    Dorothy Spark [[id number]]   Signed, Richardson Dopp, PA-C  02/14/2017 8:41 AM    [facility] [place], [place], [place]  [address] Phone: [phone]; Fax: [phone]

## 2017-02-14 ENCOUNTER — Other Ambulatory Visit: Payer: Self-pay | Admitting: *Deleted

## 2017-02-14 ENCOUNTER — Encounter: Payer: Self-pay | Admitting: Physician Assistant

## 2017-02-14 ENCOUNTER — Ambulatory Visit (INDEPENDENT_AMBULATORY_CARE_PROVIDER_SITE_OTHER): Payer: Medicare Other | Admitting: Physician Assistant

## 2017-02-14 VITALS — BP 150/78 | HR 64 | Ht 67.0 in | Wt 187.1 lb

## 2017-02-14 DIAGNOSIS — I10 Essential (primary) hypertension: Secondary | ICD-10-CM

## 2017-02-14 DIAGNOSIS — N183 Chronic kidney disease, stage 3 unspecified: Secondary | ICD-10-CM

## 2017-02-14 MED ORDER — LISINOPRIL 20 MG PO TABS: 20.0000 mg | ORAL_TABLET | Freq: Every day | ORAL | 11 refills | Status: DC

## 2017-02-14 MED ORDER — FENOFIBRATE 54 MG PO TABS: ORAL_TABLET | ORAL | 1 refills | Status: DC

## 2017-02-14 NOTE — Telephone Encounter (Signed)
[facility] 

## 2017-02-14 NOTE — Patient Instructions (Signed)
Medication Instructions:  Increase your Lisinopril to 20 mg Once daily (you can take 2 of the 10 mg tablets).  I sent a prescription in to your pharmacy for the 20 mg tablets as well.  Labwork: In 2 weeks - BMET   Testing/Procedures: None   Follow-Up: Richardson Dopp, PA-C in 6 mos.   Any Other Special Instructions Will Be Listed Below (If Applicable).  If you need a refill on your cardiac medications before your next appointment, please call your pharmacy. i

## 2017-02-28 ENCOUNTER — Telehealth: Payer: Self-pay | Admitting: *Deleted

## 2017-02-28 ENCOUNTER — Other Ambulatory Visit: Payer: Medicare Other | Admitting: *Deleted

## 2017-02-28 DIAGNOSIS — N183 Chronic kidney disease, stage 3 unspecified: Secondary | ICD-10-CM

## 2017-02-28 DIAGNOSIS — I10 Essential (primary) hypertension: Secondary | ICD-10-CM

## 2017-02-28 LAB — BASIC METABOLIC PANEL
BUN/Creatinine Ratio: 13 (ref 10–24)
BUN: 16 mg/dL (ref 8–27)
CO2: 19 mmol/L (ref 18–29)
CREATININE: 1.19 mg/dL (ref 0.76–1.27)
Calcium: 9.9 mg/dL (ref 8.6–10.2)
Chloride: 103 mmol/L (ref 96–106)
GFR, EST AFRICAN AMERICAN: 69 mL/min/{1.73_m2} (ref >59)
GFR, EST NON AFRICAN AMERICAN: 59 mL/min/{1.73_m2} — AB (ref >59)
Glucose: 102 mg/dL — ABNORMAL HIGH (ref 65–99)
POTASSIUM: 4.5 mmol/L (ref 3.5–5.2)
Sodium: 137 mmol/L (ref 134–144)

## 2017-02-28 NOTE — Telephone Encounter (Signed)
Pt notified of lab results by phone with verbal understanding. Pt agreeable to continue on current tx plan .

## 2017-02-28 NOTE — Telephone Encounter (Signed)
-----   Message from Liliane Shi, [place] sent at 02/28/2017  4:47 PM EDT ----- Please call the patient. Kidney function is stable. All other parameters are within acceptable limits and no further intervention or testing required. Continue with current treatment plan. Richardson Dopp, PA-C   02/28/2017 4:47 PM

## 2017-03-01 ENCOUNTER — Other Ambulatory Visit: Payer: Self-pay | Admitting: Internal Medicine

## 2017-03-01 DIAGNOSIS — I10 Essential (primary) hypertension: Secondary | ICD-10-CM

## 2017-03-07 ENCOUNTER — Encounter: Payer: Self-pay | Admitting: Physician Assistant

## 2017-03-13 DIAGNOSIS — H21552 Recession of chamber angle, left eye: Secondary | ICD-10-CM | POA: Diagnosis not present

## 2017-03-13 DIAGNOSIS — H35011 Changes in retinal vascular appearance, right eye: Secondary | ICD-10-CM | POA: Diagnosis not present

## 2017-03-13 DIAGNOSIS — H4030X0 Glaucoma secondary to eye trauma, unspecified eye, stage unspecified: Secondary | ICD-10-CM | POA: Diagnosis not present

## 2017-03-13 DIAGNOSIS — H35352 Cystoid macular degeneration, left eye: Secondary | ICD-10-CM | POA: Diagnosis not present

## 2017-04-23 DIAGNOSIS — J0100 Acute maxillary sinusitis, unspecified: Secondary | ICD-10-CM | POA: Diagnosis not present

## 2017-05-07 DIAGNOSIS — J343 Hypertrophy of nasal turbinates: Secondary | ICD-10-CM | POA: Diagnosis not present

## 2017-05-07 DIAGNOSIS — K219 Gastro-esophageal reflux disease without esophagitis: Secondary | ICD-10-CM | POA: Diagnosis not present

## 2017-05-07 DIAGNOSIS — R0982 Postnasal drip: Secondary | ICD-10-CM | POA: Diagnosis not present

## 2017-05-07 DIAGNOSIS — J329 Chronic sinusitis, unspecified: Secondary | ICD-10-CM | POA: Diagnosis not present

## 2017-05-07 DIAGNOSIS — H938X2 Other specified disorders of left ear: Secondary | ICD-10-CM | POA: Diagnosis not present

## 2017-05-19 ENCOUNTER — Ambulatory Visit (INDEPENDENT_AMBULATORY_CARE_PROVIDER_SITE_OTHER): Payer: Medicare Other | Admitting: Internal Medicine

## 2017-05-19 ENCOUNTER — Encounter: Payer: Self-pay | Admitting: Internal Medicine

## 2017-05-19 VITALS — BP 150/80 | HR 52 | Temp 97.9°F | Wt 178.0 lb

## 2017-05-19 DIAGNOSIS — I10 Essential (primary) hypertension: Secondary | ICD-10-CM | POA: Diagnosis not present

## 2017-05-19 DIAGNOSIS — E782 Mixed hyperlipidemia: Secondary | ICD-10-CM

## 2017-05-19 DIAGNOSIS — J320 Chronic maxillary sinusitis: Secondary | ICD-10-CM | POA: Diagnosis not present

## 2017-05-19 DIAGNOSIS — Z23 Encounter for immunization: Secondary | ICD-10-CM | POA: Diagnosis not present

## 2017-05-19 DIAGNOSIS — R350 Frequency of micturition: Secondary | ICD-10-CM

## 2017-05-19 DIAGNOSIS — N401 Enlarged prostate with lower urinary tract symptoms: Secondary | ICD-10-CM

## 2017-05-19 DIAGNOSIS — N183 Chronic kidney disease, stage 3 unspecified: Secondary | ICD-10-CM

## 2017-05-19 DIAGNOSIS — R739 Hyperglycemia, unspecified: Secondary | ICD-10-CM

## 2017-05-19 MED ORDER — LISINOPRIL 40 MG PO TABS: 40.0000 mg | ORAL_TABLET | Freq: Every day | ORAL | 11 refills | Status: DC

## 2017-05-19 MED ORDER — SILODOSIN 4 MG PO CAPS: 4.0000 mg | ORAL_CAPSULE | Freq: Every day | ORAL | 3 refills | Status: DC

## 2017-05-19 NOTE — Progress Notes (Signed)
Location:  [facility] clinic Provider:  Crystall Donaldson L. Mariea Clonts, D.O., C.M.D.  Code Status: full code Goals of Care:  Advanced Directives 05/19/2017  Does Patient Have a Medical Advance Directive? -  Type of Advance Directive -  Does patient want to make changes to medical advance directive? -  Copy of [place] in Chart? -  Would patient like information on creating a medical advance directive? No - Patient declined   Chief Complaint  Patient presents with  . Medical Management of Chronic Issues    56mth follow-up    HPI: Patient is a 76 y.o. male seen today for medical management of chronic diseases.    Had a real fit with his sinuses.  Saw Dr. Sallee Provencal at ENT.  He is on "sinus pills" two a day.  He completed the course of augmentin.  He had bad postnasal drip, was very scratchy throat and coughing.    He lost a few lbs.  He backed off of salt completely.  Stopped going to [employer].    Says BP 175-102 systolic at home.  Lisinopril was increased from 10mg  to 20mg  daily.    He is no longer taking mobic for pain.  It had been prescribed by ortho for his shoulder--no longer bothering him.    CKD3:  Kidney function has improved with improving bp.  He has laid off the sodas, too and caffeine.   Using the rapaflo and vesicare and urine flowing very well.  He's saying sometimes he does not use it b/c he has had incontinence with the rapaflo.    Agrees to flu shot.  Past Medical History:  Diagnosis Date  . Allergic rhinitis    seasonal  . BPH (benign prostatic hyperplasia)   . Carotid bruit    Carotid US 5/18:  1-39% RICA stenosis; Prominent R subclavian artery, measuring 1.4 cm AP x 1.5 cm TRV. - FU prn  . Cervicalgia 02/28/2016  . Chronic kidney disease   . Hypercholesteremia   . Hypertension     Past Surgical History:  Procedure Laterality Date  . COLONOSCOPY  02/2009   Sessile polyp in the rectum. Otherwise normal examination  . EYE SURGERY  2013   Right eye  cataract removed--Dr. Katy Fitch    No Known Allergies  Allergies as of 05/19/2017   No Known Allergies     Medication List       Accurate as of 05/19/17  8:33 AM. Always use your most recent med list.          amLODipine 10 MG tablet Commonly known as:  NORVASC TAKE 1 TABLET (10 MG TOTAL) BY MOUTH DAILY.   aspirin 81 MG chewable tablet Chew 81 mg by mouth daily.   fenofibrate 54 MG tablet Take one tablet by mouth once daily   lisinopril 20 MG tablet Commonly known as:  PRINIVIL,ZESTRIL Take 1 tablet (20 mg total) by mouth daily.   metoprolol 200 MG 24 hr tablet Commonly known as:  TOPROL-XL Take 1 tablet (200 mg total) by mouth daily.   mometasone 50 MCG/ACT nasal spray Commonly known as:  NASONEX 2 sprays by Each Nare route nightly.   pravastatin 40 MG tablet Commonly known as:  PRAVACHOL Take one tablet by mouth once daily for cholesterol   silodosin 8 MG Caps capsule Commonly known as:  RAPAFLO Take 8 mg by mouth daily with breakfast. For prostate   solifenacin 5 MG tablet Commonly known as:  VESICARE Take 5 mg by mouth  daily.       Review of Systems:  Review of Systems  Constitutional: Negative for chills, fever and malaise/fatigue.  HENT: Positive for congestion, hearing loss and sore throat.   Eyes: Negative for blurred vision.  Respiratory: Negative for cough and shortness of breath.   Cardiovascular: Negative for chest pain, palpitations and leg swelling.  Gastrointestinal: Negative for abdominal pain, blood in stool, constipation and melena.  Genitourinary: Positive for urgency. Negative for dysuria, frequency and hematuria.  Musculoskeletal: Negative for falls and joint pain.  Skin: Negative for itching and rash.  Neurological: Negative for dizziness and weakness.  Psychiatric/Behavioral: Negative for memory loss.    Health Maintenance  Topic Date Due  . INFLUENZA VACCINE  04/30/2017  . COLONOSCOPY  03/30/2019  . TETANUS/TDAP  09/10/2026    . PNA vac Low Risk Adult  Completed    Physical Exam: Vitals:   05/19/17 0825  BP: (!) 150/80  Pulse: (!) 52  Temp: 97.9 F (36.6 C)  TempSrc: Oral  SpO2: 97%  Weight: 178 lb (80.7 kg)   Body mass index is 27.88 kg/m. Physical Exam  Constitutional: He is oriented to person, place, and time. He appears well-developed and well-nourished. No distress.  HENT:  Head: Normocephalic and atraumatic.  Nasal congestion  Eyes:  glasses  Cardiovascular: Normal rate, regular rhythm, normal heart sounds and intact distal pulses.   Pulmonary/Chest: Effort normal and breath sounds normal. No respiratory distress.  Musculoskeletal: Normal range of motion.  Neurological: He is alert and oriented to person, place, and time.  Skin: Skin is warm and dry.  Psychiatric: He has a normal mood and affect.    Labs reviewed: Basic Metabolic Panel:  Recent Labs  01/10/17 0950 01/21/17 0852 02/28/17 0806  NA 137 140 137  K 4.2 4.8 4.5  CL 104 100 103  CO2 25 22 19   GLUCOSE 109* 121* 102*  BUN 18 15 16   CREATININE 1.38* 1.21 1.19  CALCIUM 9.7 9.9 9.9   Liver Function Tests:  Recent Labs  09/06/16 0935 01/10/17 0950  AST 17 17  ALT 16 20  ALKPHOS 86 91  BILITOT 0.7 0.6  PROT 7.5 7.1  ALBUMIN 4.1 3.9   No results for input(s): LIPASE, AMYLASE in the last 8760 hours. No results for input(s): AMMONIA in the last 8760 hours. CBC:  Recent Labs  09/06/16 0935  WBC 6.3  NEUTROABS 2,898  HGB 15.2  HCT 46.6  MCV 94.0  PLT 324   Lipid Panel:  Recent Labs  09/06/16 0935 01/10/17 0950  CHOL 175 169  HDL 53 46  LDLCALC 90 77  TRIG 161* 231*  CHOLHDL 3.3 3.7   Lab Results  Component Value Date   HGBA1C 5.3 01/10/2017   Assessment/Plan 1. Uncontrolled hypertension - bp still elevated here, I rechecked and it went up to 165/90 -cont amlodipine 10mg  daily, increase lisinopril to 40mg  daily, cont toprol xl 200mg  daily - lisinopril (PRINIVIL,ZESTRIL) 40 MG tablet; Take 1  tablet (40 mg total) by mouth daily.  Dispense: 30 tablet; Refill: 11 -cont to work on diet and exercise regimen--has cut out salt and stopped his [employer] trips  2. Chronic maxillary sinusitis -completed augmentin therapy, using nasal saline, symptoms gradually improving, following with ENT  3. Benign prostatic hyperplasia with urinary frequency - now having some leakage when he gets an urge if he takes the rapaflo 8mg  daily so try reducing to 4mg  daily, cont vesicare also 5mg  daily - silodosin (RAPAFLO) 4 MG CAPS  capsule; Take 1 capsule (4 mg total) by mouth daily with breakfast.  Dispense: 30 capsule; Refill: 3  4. Chronic kidney disease, stage 3 -increase ace dose, renal function improved since bp better controlled Avoid nephrotoxic agents like nsaids, dose adjust renally excreted meds, hydrate. - Basic metabolic panel; Future  5. Hyperglycemia - working on diet and has lost some weight, hba1c has been trending down - Hemoglobin A1c; Future - Basic metabolic panel; Future  6. Mixed hyperlipidemia -cont pravachol, f/u labs - Lipid panel; Future  7. Need for influenza vaccination -high dose flu shot given today  Labs/tests ordered:   Orders Placed This Encounter  Procedures  . Hemoglobin A1c    Standing Status:   Future    Standing Expiration Date:   01/17/2018  . Basic metabolic panel    Standing Status:   Future    Standing Expiration Date:   01/17/2018    Order Specific Question:   Has the patient fasted?    Answer:   Yes  . Lipid panel    Standing Status:   Future    Standing Expiration Date:   01/17/2018    Order Specific Question:   Has the patient fasted?    Answer:   Yes    Next appt:  09/25/2017 med mgt, labs before  [place] Romin Divita, D.O. [facility] [address], [place] [address] Cell Phone (Mon-Fri 8am-5pm):  [phone] On Call:  [phone] & follow prompts after 5pm & weekends Office Phone:   [phone] Office Fax:  [phone]

## 2017-05-19 NOTE — Addendum Note (Signed)
Addended by: Despina Hidden on: 05/19/2017 09:21 AM   Modules accepted: Orders

## 2017-06-06 ENCOUNTER — Telehealth: Payer: Self-pay | Admitting: *Deleted

## 2017-06-06 NOTE — Telephone Encounter (Signed)
Lmtcb to reschedule appt for 08/12/17 with Richardson Dopp, PA. Provider will be out of the office. Would like to see if pt would be willing to come in 08/08/17 to see Richardson Dopp, PA.

## 2017-07-11 DIAGNOSIS — J310 Chronic rhinitis: Secondary | ICD-10-CM | POA: Diagnosis not present

## 2017-07-11 DIAGNOSIS — K219 Gastro-esophageal reflux disease without esophagitis: Secondary | ICD-10-CM | POA: Diagnosis not present

## 2017-07-11 DIAGNOSIS — R0982 Postnasal drip: Secondary | ICD-10-CM | POA: Diagnosis not present

## 2017-07-13 ENCOUNTER — Encounter (HOSPITAL_COMMUNITY): Payer: Self-pay | Admitting: Emergency Medicine

## 2017-07-13 DIAGNOSIS — R0981 Nasal congestion: Secondary | ICD-10-CM | POA: Insufficient documentation

## 2017-07-13 DIAGNOSIS — I129 Hypertensive chronic kidney disease with stage 1 through stage 4 chronic kidney disease, or unspecified chronic kidney disease: Secondary | ICD-10-CM | POA: Diagnosis not present

## 2017-07-13 DIAGNOSIS — Z7982 Long term (current) use of aspirin: Secondary | ICD-10-CM | POA: Diagnosis not present

## 2017-07-13 DIAGNOSIS — N183 Chronic kidney disease, stage 3 (moderate): Secondary | ICD-10-CM | POA: Insufficient documentation

## 2017-07-13 DIAGNOSIS — R05 Cough: Secondary | ICD-10-CM | POA: Diagnosis not present

## 2017-07-13 DIAGNOSIS — Z79899 Other long term (current) drug therapy: Secondary | ICD-10-CM | POA: Diagnosis not present

## 2017-07-13 DIAGNOSIS — R9431 Abnormal electrocardiogram [ECG] [EKG]: Secondary | ICD-10-CM | POA: Diagnosis not present

## 2017-07-13 DIAGNOSIS — R51 Headache: Secondary | ICD-10-CM | POA: Diagnosis not present

## 2017-07-13 NOTE — ED Triage Notes (Signed)
Pt reports a headache that started about 30 minutes ago, reports that he has had "some sinus stuff" he has been trying to "get rid of w/ my specialist"  Pt reports a cough for over a month.  No neural deficits.

## 2017-07-14 ENCOUNTER — Emergency Department (HOSPITAL_COMMUNITY)
Admission: EM | Admit: 2017-07-14 | Discharge: 2017-07-14 | Disposition: A | Discharge status: Home / Self Care | Payer: Medicare Other | Attending: Emergency Medicine | Admitting: Emergency Medicine

## 2017-07-14 ENCOUNTER — Emergency Department (HOSPITAL_COMMUNITY): Payer: Medicare Other

## 2017-07-14 DIAGNOSIS — R0981 Nasal congestion: Secondary | ICD-10-CM

## 2017-07-14 DIAGNOSIS — R519 Headache, unspecified: Secondary | ICD-10-CM

## 2017-07-14 DIAGNOSIS — R05 Cough: Secondary | ICD-10-CM | POA: Diagnosis not present

## 2017-07-14 DIAGNOSIS — R51 Headache: Secondary | ICD-10-CM

## 2017-07-14 DIAGNOSIS — R059 Cough, unspecified: Secondary | ICD-10-CM

## 2017-07-14 LAB — BASIC METABOLIC PANEL
Anion gap: 9 (ref 5–15)
BUN: 23 mg/dL — AB (ref 6–20)
CALCIUM: 9.4 mg/dL (ref 8.9–10.3)
CHLORIDE: 103 mmol/L (ref 101–111)
CO2: 23 mmol/L (ref 22–32)
CREATININE: 1.44 mg/dL — AB (ref 0.61–1.24)
GFR calc Af Amer: 53 mL/min — ABNORMAL LOW (ref >60)
GFR, EST NON AFRICAN AMERICAN: 46 mL/min — AB (ref >60)
Glucose, Bld: 126 mg/dL — ABNORMAL HIGH (ref 65–99)
Potassium: 4.1 mmol/L (ref 3.5–5.1)
SODIUM: 135 mmol/L (ref 135–145)

## 2017-07-14 LAB — CBC WITH DIFFERENTIAL/PLATELET
BASOS PCT: 1 %
Basophils Absolute: 0.1 10*3/uL (ref 0.0–0.1)
EOS ABS: 0.5 10*3/uL (ref 0.0–0.7)
Eosinophils Relative: 7 %
HCT: 44.6 % (ref 39.0–52.0)
Hemoglobin: 14.4 g/dL (ref 13.0–17.0)
LYMPHS ABS: 1.2 10*3/uL (ref 0.7–4.0)
Lymphocytes Relative: 16 %
MCH: 31 pg (ref 26.0–34.0)
MCHC: 32.3 g/dL (ref 30.0–36.0)
MCV: 95.9 fL (ref 78.0–100.0)
MONO ABS: 0.6 10*3/uL (ref 0.1–1.0)
MONOS PCT: 8 %
Neutro Abs: 4.9 10*3/uL (ref 1.7–7.7)
Neutrophils Relative %: 68 %
PLATELETS: 259 10*3/uL (ref 150–400)
RBC: 4.65 MIL/uL (ref 4.22–5.81)
RDW: 13 % (ref 11.5–15.5)
WBC: 7.1 10*3/uL (ref 4.0–10.5)

## 2017-07-14 LAB — I-STAT TROPONIN, ED: TROPONIN I, POC: 0.01 ng/mL (ref 0.00–0.08)

## 2017-07-14 MED ORDER — ALBUTEROL SULFATE (2.5 MG/3ML) 0.083% IN NEBU
5.0000 mg | INHALATION_SOLUTION | Freq: Once | RESPIRATORY_TRACT | Status: AC
  Administered 2017-07-14: 5 mg via RESPIRATORY_TRACT
  Filled 2017-07-14: qty 6

## 2017-07-14 MED ORDER — LISINOPRIL 20 MG PO TABS
40.0000 mg | ORAL_TABLET | Freq: Once | ORAL | Status: AC
  Administered 2017-07-14: 40 mg via ORAL
  Filled 2017-07-14: qty 2

## 2017-07-14 MED ORDER — AMLODIPINE BESYLATE 5 MG PO TABS
10.0000 mg | ORAL_TABLET | Freq: Once | ORAL | Status: AC
  Administered 2017-07-14: 10 mg via ORAL
  Filled 2017-07-14: qty 2

## 2017-07-14 MED ORDER — METOPROLOL SUCCINATE ER 100 MG PO TB24
200.0000 mg | ORAL_TABLET | Freq: Once | ORAL | Status: AC
  Administered 2017-07-14: 200 mg via ORAL
  Filled 2017-07-14: qty 2

## 2017-07-14 NOTE — ED Provider Notes (Signed)
[facility] DEPT Provider Note   CSN: [id number] Arrival date & time: 07/13/17  2221     History   Chief Complaint Chief Complaint  Patient presents with  . Headache  . Cough    HPI Douglas Lowery is a 76 y.o. male.  The history is provided by the patient and medical records.    76 year old male with history of allergic rhinitis, BPH, chronic kidney disease, hyperlipidemia, hypertension, presenting to the ED with cough and headache. Patient reports he has had a long-standing issue with nasal congestion as well as postnasal drip. Has been seeing an ENT over the past month but feels like "they include doing nothing". States he continues to have a lot of nasal congestion and feels like it is down into his chest. He reports wet sounding cough. He does report some discomfort in his chest with coughing, none when sitting still. No exertional component to this. Reports some shortness of breath if he gets in a bad coughing fit, but otherwise breathing feels normal. He denies any fever or chills. He has no history of asthma or COPD. Never been a smoker. He has no known cardiac history. He has been seen by cardiology in the past but reports all of his tests were negative.  States when he coughs he also feels like it makes his head hurt. States he is also having her blood pressures to normal lately, his primary care doctor is working on this with him and adjusting medications. Is not currently on any anticoagulation. He denies any numbness, weakness, confusion, changes in speech, or difficult walking. No blurred vision, neck pain or stiffness.  Past Medical History:  Diagnosis Date  . Allergic rhinitis    seasonal  . BPH (benign prostatic hyperplasia)   . Carotid bruit    Carotid US 5/18:  1-39% RICA stenosis; Prominent R subclavian artery, measuring 1.4 cm AP x 1.5 cm TRV. - FU prn  . Cervicalgia 02/28/2016  . Chronic kidney disease   . Hypercholesteremia   . Hypertension     Patient  Active Problem List   Diagnosis Date Noted  . Hyperglycemia 01/10/2017  . Cervicalgia 02/28/2016  . Uncontrolled hypertension 12/28/2015  . Chronic rhinitis 12/06/2015  . Hypertrophy of nasal turbinates 12/06/2015  . Benign growth on outer layer of left eye 10/28/2015  . 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 Essential hypertension, benign 12/02/2013  . Mixed hyperlipidemia 12/02/2013  . Allergic rhinitis due to pollen 12/21/2012  . BPH (benign prostatic hyperplasia) 12/21/2012  . Cerumen impaction 12/21/2012  . Chronic kidney disease, stage 3 (HCC)     Past Surgical History:  Procedure Laterality Date  . COLONOSCOPY  02/2009   Sessile polyp in the rectum. Otherwise normal examination  . EYE SURGERY  2013   Right eye cataract removed--Dr. Katy Fitch       Home Medications    Prior to Admission medications   Medication Sig Start Date End Date Taking? Authorizing Provider  amLODipine (NORVASC) 10 MG tablet TAKE 1 TABLET (10 MG TOTAL) BY MOUTH DAILY. 03/03/17  Yes Reed, Tiffany L, DO  amoxicillin-clavulanate (AUGMENTIN) 875-125 MG tablet Take 1 tablet by mouth once.   Yes [provider]  aspirin 81 MG chewable tablet Chew 81 mg by mouth daily.   Yes [provider]  fenofibrate 54 MG tablet Take one tablet by mouth once daily 02/14/17  Yes Reed, Tiffany L, DO  lisinopril (PRINIVIL,ZESTRIL) 40 MG tablet Take 1 tablet (40 mg total) by mouth daily.  05/19/17 05/14/18 Yes Reed, Tiffany L, DO  metoprolol (TOPROL-XL) 200 MG 24 hr tablet Take 1 tablet (200 mg total) by mouth daily. 01/13/17  Yes Reed, Tiffany L, DO  mometasone (NASONEX) 50 MCG/ACT nasal spray 2 sprays by Each Nare route nightly. 05/07/17  Yes [provider]  pravastatin (PRAVACHOL) 40 MG tablet Take one tablet by mouth once daily for cholesterol 01/13/17  Yes Reed, Tiffany L, DO  silodosin (RAPAFLO) 4 MG CAPS capsule Take 1 capsule (4 mg total) by mouth daily with breakfast. 05/19/17  Yes Reed, Tiffany L,  DO  solifenacin (VESICARE) 5 MG tablet Take 5 mg by mouth daily.   Yes [provider]    Family History Family History  Problem Relation Age of Onset  . Pneumonia Father   . Alzheimer's disease Sister   . Hypertension Sister   . Hypertension Sister   . Hypertension Sister   . Heart attack Neg Hx     Social History Social History  Substance Use Topics  . Smoking status: Never Smoker  . Smokeless tobacco: Never Used  . Alcohol use Yes     Comment: 40 oz beer every week     Allergies   Patient has no known allergies.   Review of Systems Review of Systems  Respiratory: Positive for cough.   Neurological: Positive for headaches.  All other systems reviewed and are negative.    Physical Exam Updated Vital Signs BP (!) 177/100 (BP Location: Left Arm)   Pulse 68   Temp 98.1 F (36.7 C) (Oral)   Resp 16   Ht 5\' 6"  (1.676 m)   Wt 81.6 kg (180 lb)   SpO2 96%   BMI 29.05 kg/m   Physical Exam  Constitutional: He is oriented to person, place, and time. He appears well-developed and well-nourished. No distress.  HENT:  Head: Normocephalic and atraumatic.  Right Ear: Tympanic membrane and external ear normal.  Left Ear: Tympanic membrane and external ear normal.  Nose: Mucosal edema present. Right sinus exhibits maxillary sinus tenderness and frontal sinus tenderness. Left sinus exhibits maxillary sinus tenderness and frontal sinus tenderness.  Mouth/Throat: Uvula is midline, oropharynx is clear and moist and mucous membranes are normal.  Poor dentition, PND noted without tonsillar edema or exudates  Eyes: Pupils are equal, round, and reactive to light. Conjunctivae and EOM are normal.  Neck: Normal range of motion and full passive range of motion without pain. Neck supple. No neck rigidity.  No rigidity, no meningismus  Cardiovascular: Normal rate, regular rhythm and normal heart sounds.   No murmur heard. Pulmonary/Chest: Effort normal and breath sounds  normal. No respiratory distress. He has no wheezes. He has no rhonchi.  Breath sounds are junky at the bases bilaterally, slightly worse on the right, no distress, able to speak in full sentences without difficulty  Abdominal: Soft. Bowel sounds are normal. There is no tenderness. There is no guarding.  Musculoskeletal: Normal range of motion. He exhibits no edema.  Neurological: He is alert and oriented to person, place, and time. He has normal strength. He displays no tremor. No cranial nerve deficit or sensory deficit. He displays no seizure activity.  AAOx3, answering questions and following commands appropriately; equal strength UE and LE bilaterally; CN grossly intact; moves all extremities appropriately without ataxia; no focal neuro deficits or facial asymmetry appreciated  Skin: Skin is warm and dry. No rash noted. He is not diaphoretic.  Psychiatric: He has a normal mood and affect. His behavior  is normal. Thought content normal.  Nursing note and vitals reviewed.    ED Treatments / Results  Labs (all labs ordered are listed, but only abnormal results are displayed) Labs Reviewed  BASIC METABOLIC PANEL - Abnormal; Notable for the following:       Result Value   Glucose, Bld 126 (*)    BUN 23 (*)    Creatinine, Ser 1.44 (*)    GFR calc non Af Amer 46 (*)    GFR calc Af Amer 53 (*)    All other components within normal limits  CBC WITH DIFFERENTIAL/PLATELET  I-STAT TROPONIN, ED    EKG  EKG Interpretation  Date/Time:  Monday July 14 2017 01:26:31 EDT Ventricular Rate:  60 PR Interval:    QRS Duration: 86 QT Interval:  405 QTC Calculation: 405 R Axis:   17 Text Interpretation:  Sinus rhythm Nonspecific T abnormalities, inferior leads ST elevation, consider anterior injury Baseline wander in lead(s) V5 Early repolarization Confirmed by Thayer Jew [phone]) on 07/14/2017 1:29:32 AM       Radiology Dg Chest 2 View  Result Date: 07/14/2017 CLINICAL DATA:  Cough  and headache for over 1 month. EXAM: CHEST  2 VIEW COMPARISON:  07/16/2010 FINDINGS: The lungs are clear. The pulmonary vasculature is normal. Heart size is normal. Hilar and mediastinal contours are unremarkable. There is no pleural effusion. IMPRESSION: No active cardiopulmonary disease. Electronically Signed   By: Andreas Newport M.D.   On: 07/14/2017 01:36    Procedures Procedures (including critical care time)  Medications Ordered in ED Medications  albuterol (PROVENTIL) (2.5 MG/3ML) 0.083% nebulizer solution 5 mg (5 mg Nebulization Given 07/14/17 0121)  amLODipine (NORVASC) tablet 10 mg (10 mg Oral Given 07/14/17 0121)  metoprolol succinate (TOPROL-XL) 24 hr tablet 200 mg (200 mg Oral Given 07/14/17 0214)  lisinopril (PRINIVIL,ZESTRIL) tablet 40 mg (40 mg Oral Given 07/14/17 0121)     Initial Impression / Assessment and Plan / ED Course  I have reviewed the triage vital signs and the nursing notes.  Pertinent labs & imaging results that were available during my care of the patient were reviewed by me and considered in my medical decision making (see chart for details).  76 year old male here with headache and cough. Has had issues with nasal congestion and postnasal drip for about a month. Has seen ENT but reports no improvement.states when he coughs he does have some discomfort in the chest but also makes his head hurt. States he feels his head may be hurting because he has not had his blood pressure medications today either. He is afebrile and nontoxic. Neurologically intact. No signs or symptoms concerning for meningitis. He does have some junky breath sounds at the bases bilaterally, slightly more pronounced on the right. No respiratory distress. Vitals are stable on room air. Will plan for screening labs, EKG, chest x-ray. He was given albuterol treatment here. Also given dose of his home BP medications.  Screening labs overall reassuring. Chest x-ray is clear. Blood pressure has  improved after administration of home medications. Patient states headache is improving as well. Remains neurologically intact. At this time, low suspicion for acute intracranial pathology including TIA, stroke, ICH, SAH, or meningitis. Headache may be multifactorial given his chronic sinus congestion and medication noncompliance.  Discussed importance of adhering to his home medication regimen. Can continue follow-up with ENT.  Discussed plan with patient, he/she acknowledged understanding and agreed with plan of care.  Return precautions given for new or worsening  symptoms.  Patient seen and evaluated with attending physician, Dr. Dina Rich, who agrees with assessment and plan of care.  Final Clinical Impressions(s) / ED Diagnoses   Final diagnoses:  Cough  Nasal congestion  Nonintractable headache, unspecified chronicity pattern, unspecified headache type    New Prescriptions Discharge Medication List as of 07/14/2017  3:40 AM       Larene Pickett, PA-C 07/14/17 8329    Merryl Hacker, MD 07/18/17 1615

## 2017-07-14 NOTE — Discharge Instructions (Signed)
Your labs and chest x-ray today looked good.   Would recommend coricidin HBP (red and white box) for your symptoms.  This is available over the counter and will not increase your blood pressure. Make sure your are taking your medications regularly as scheduled. Follow-up with your primary care doctor. Return to the ED for new or worsening symptoms.

## 2017-07-14 NOTE — ED Notes (Signed)
Pt taken to xray 

## 2017-07-29 DIAGNOSIS — R0982 Postnasal drip: Secondary | ICD-10-CM | POA: Diagnosis not present

## 2017-07-29 DIAGNOSIS — K219 Gastro-esophageal reflux disease without esophagitis: Secondary | ICD-10-CM | POA: Diagnosis not present

## 2017-07-29 DIAGNOSIS — J310 Chronic rhinitis: Secondary | ICD-10-CM | POA: Diagnosis not present

## 2017-08-09 ENCOUNTER — Other Ambulatory Visit: Payer: Self-pay | Admitting: Internal Medicine

## 2017-08-09 DIAGNOSIS — I10 Essential (primary) hypertension: Secondary | ICD-10-CM

## 2017-08-10 NOTE — Progress Notes (Signed)
Cardiology Office Note    Date:  08/12/2017   ID:  Douglas Lowery, DOB 01-29-1941, MRN [id number]  PCP:  Gayland Curry, DO  Cardiologist:  Dr. Casandra Doffing / Richardson Dopp, PA-C  Chief Complaint: 6 months follow up   History of Present Illness:   Douglas Lowery is a 76 y.o. male with a hx of HTN, HL ,CKD, BPH presents for follow up.    Carotid US 5/18:   1-39% RICA stenosis; Prominent R subclavian artery, measuring 1.4 cm AP x 1.5 cm . - FU prn  Renal Art [place] 2/17 No significant RA stenosis  Hx of uncontrolled blood pressure. Last seen by Richardson Dopp, PA 02/14/17.   Patient presents for follow-up.  His blood pressure is now improving after exercise, dietary changes and losing weight.  He has lost approximately 10 pounds in the past 6-8 months.  His blood pressure now ranging 140-160/80-90s.  Patient was seen in the emergency room for sinus congestion.  Denies chest pain, palpitation, shortness of breath, orthopnea, PND, syncope or lower extremity edema.  Compliant with medication.   Past Medical History:  Diagnosis Date  . Allergic rhinitis    seasonal  . BPH (benign prostatic hyperplasia)   . Carotid bruit    Carotid US 5/18:  1-39% RICA stenosis; Prominent R subclavian artery, measuring 1.4 cm AP x 1.5 cm TRV. - FU prn  . Cervicalgia 02/28/2016  . Chronic kidney disease   . Hypercholesteremia   . Hypertension     Past Surgical History:  Procedure Laterality Date  . COLONOSCOPY  02/2009   Sessile polyp in the rectum. Otherwise normal examination  . EYE SURGERY  2013   Right eye cataract removed--Dr. Katy Fitch    Current Medications:  Prior to Admission medications   Medication Sig Start Date End Date Taking? Authorizing Provider  AMLODIPINE BESYLATE PO Take 20 mg daily by mouth. TAKE 1 20 MG TABLET PO DAILY   Yes [provider]  aspirin 81 MG chewable tablet Chew 81 mg by mouth daily.   Yes [provider]  fenofibrate 54 MG tablet Take one  tablet by mouth once daily 02/14/17  Yes Reed, Tiffany L, DO  lisinopril (PRINIVIL,ZESTRIL) 40 MG tablet Take 1 tablet (40 mg total) by mouth daily. 05/19/17 05/14/18 Yes Reed, Tiffany L, DO  metoprolol (TOPROL-XL) 200 MG 24 hr tablet Take 1 tablet (200 mg total) by mouth daily. 01/13/17  Yes Reed, Tiffany L, DO  mometasone (NASONEX) 50 MCG/ACT nasal spray 2 sprays by Each Nare route nightly. 05/07/17  Yes [provider]  pravastatin (PRAVACHOL) 40 MG tablet Take one tablet by mouth once daily for cholesterol 01/13/17  Yes Reed, Tiffany L, DO  silodosin (RAPAFLO) 4 MG CAPS capsule Take 1 capsule (4 mg total) by mouth daily with breakfast. 05/19/17  Yes Reed, Tiffany L, DO  solifenacin (VESICARE) 5 MG tablet Take 5 mg by mouth daily.   Yes [provider]     Allergies:   Patient has no known allergies.   Social History   Socioeconomic History  . Marital status: Single    Spouse name: None  . Number of children: None  . Years of education: None  . Highest education level: None  Social Needs  . Financial resource strain: None  . Food insecurity - worry: None  . Food insecurity - inability: None  . Transportation needs - medical: None  . Transportation needs - non-medical: None  Occupational History  .  Occupation: Retired  Tobacco Use  . Smoking status: Never Smoker  . Smokeless tobacco: Never Used  Substance and Sexual Activity  . Alcohol use: Yes    Comment: 40 oz beer every week  . Drug use: No  . Sexual activity: Yes  Other Topics Concern  . 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   Single   4 children; 6 grandchildren     Family History:  The patient's family history includes Alzheimer's disease in his sister; Hypertension in his sister, sister, and sister; Pneumonia in his father.   ROS:   Please see the history of present illness.    ROS All other systems reviewed and are negative.   PHYSICAL EXAM:   VS:   BP (!) 164/94   Pulse 70   Ht 5\' 7"  (1.702 m)   Wt 182 lb 8 oz (82.8 kg)   SpO2 99%   BMI 28.58 kg/m    GEN: Well nourished, well developed, in no acute distress  HEENT: normal  Neck: no JVD, carotid bruits, or masses Cardiac: RRR; no murmurs, rubs, or gallops,no edema  Respiratory:  clear to auscultation bilaterally, normal work of breathing GI: soft, nontender, nondistended, + BS MS: no deformity or atrophy  Skin: warm and dry, no rash Neuro:  Alert and Oriented x 3, Strength and sensation are intact Psych: euthymic mood, full affect  Wt Readings from Last 3 Encounters:  08/12/17 182 lb 8 oz (82.8 kg)  07/13/17 180 lb (81.6 kg)  05/19/17 178 lb (80.7 kg)      Studies/Labs Reviewed:   EKG:  EKG is not ordered today.    Recent Labs: 01/10/2017: ALT 20 07/14/2017: BUN 23; Creatinine, Ser 1.44; Hemoglobin 14.4; Platelets 259; Potassium 4.1; Sodium 135   Lipid Panel    Component Value Date/Time   CHOL 169 01/10/2017 0950   CHOL 191 10/04/2015 0950   TRIG 231 (H) 01/10/2017 0950   HDL 46 01/10/2017 0950   HDL 64 10/04/2015 0950   CHOLHDL 3.7 01/10/2017 0950   VLDL 46 (H) 01/10/2017 0950   LDLCALC 77 01/10/2017 0950   LDLCALC 102 (H) 10/04/2015 0950    Additional studies/ records that were reviewed today include:   As above    ASSESSMENT & PLAN:    1. HTN -Improving with lifestyle modification.  Discussed possible addition of hydralazine however patient wants to defer for now.  Will continue current regimen of amlodipine 20 mg daily, lisinopril 40 mg daily and Toprol-XL 200 mg daily.  His dry cough is likely due to cold/sinus congestion rather than side effect of lisinopril.  2. CKD stage III -Followed by PCP.  3. HLD - 01/10/2017: Cholesterol 169; HDL 46; LDL Cholesterol 77; Triglycerides 231; VLDL 46  - Followed by PCP.  Continue statin.  4. Cough -As above. If no improvement after resolution of cold symptoms, may consider changing lisinopril to ARB.    Medication Adjustments/Labs and Tests Ordered: Current medicines are reviewed at length with the patient today.  Concerns regarding medicines are outlined above.  Medication changes, Labs and Tests ordered today are listed in the Patient Instructions below. Patient Instructions  Medication Instructions:  Your physician recommends that you continue on your current medications as directed. Please refer to the Current Medication list given to you today.   Labwork: None ordered  Testing/Procedures: None ordered  Follow-Up: Your physician wants you to follow-up in: [place] DR. VARANASI  You will receive a reminder letter in the mail two months in advance. If you don't receive a letter, please call our office to schedule the follow-up appointment.   Any Other Special Instructions Will Be Listed Below (If Applicable).     If you need a refill on your cardiac medications before your next appointment, please call your pharmacy.      Jarrett Soho, [place]  08/12/2017 10:27 AM    [facility] [address], [place], [place]  [address] Phone: [phone]; Fax: [phone]

## 2017-08-11 ENCOUNTER — Ambulatory Visit: Payer: Medicare Other | Admitting: Physician Assistant

## 2017-08-12 ENCOUNTER — Ambulatory Visit (INDEPENDENT_AMBULATORY_CARE_PROVIDER_SITE_OTHER): Payer: Medicare Other | Admitting: Physician Assistant

## 2017-08-12 ENCOUNTER — Ambulatory Visit: Payer: Medicare Other | Admitting: Physician Assistant

## 2017-08-12 ENCOUNTER — Encounter: Payer: Self-pay | Admitting: Physician Assistant

## 2017-08-12 VITALS — BP 164/94 | HR 70 | Ht 67.0 in | Wt 182.5 lb

## 2017-08-12 DIAGNOSIS — N183 Chronic kidney disease, stage 3 unspecified: Secondary | ICD-10-CM

## 2017-08-12 DIAGNOSIS — E782 Mixed hyperlipidemia: Secondary | ICD-10-CM | POA: Diagnosis not present

## 2017-08-12 DIAGNOSIS — R05 Cough: Secondary | ICD-10-CM | POA: Diagnosis not present

## 2017-08-12 DIAGNOSIS — I10 Essential (primary) hypertension: Secondary | ICD-10-CM | POA: Diagnosis not present

## 2017-08-12 DIAGNOSIS — R059 Cough, unspecified: Secondary | ICD-10-CM

## 2017-08-12 NOTE — Patient Instructions (Signed)

## 2017-08-18 ENCOUNTER — Ambulatory Visit: Payer: Medicare Other | Admitting: Physician Assistant

## 2017-08-29 DIAGNOSIS — J310 Chronic rhinitis: Secondary | ICD-10-CM | POA: Diagnosis not present

## 2017-08-29 DIAGNOSIS — K219 Gastro-esophageal reflux disease without esophagitis: Secondary | ICD-10-CM | POA: Diagnosis not present

## 2017-09-03 ENCOUNTER — Telehealth: Payer: Self-pay

## 2017-09-03 NOTE — Telephone Encounter (Signed)
Called patient to try to schedule AWV in the office. No answer-left voicemail to call back.    

## 2017-09-12 ENCOUNTER — Other Ambulatory Visit: Payer: Self-pay | Admitting: *Deleted

## 2017-09-12 MED ORDER — PRAVASTATIN SODIUM 40 MG PO TABS: ORAL_TABLET | ORAL | 1 refills | Status: DC

## 2017-09-12 NOTE — Telephone Encounter (Signed)
[facility] 

## 2017-09-17 ENCOUNTER — Ambulatory Visit (INDEPENDENT_AMBULATORY_CARE_PROVIDER_SITE_OTHER): Payer: Medicare Other

## 2017-09-17 VITALS — BP 164/84 | HR 68 | Temp 98.5°F | Ht 67.0 in | Wt 181.0 lb

## 2017-09-17 DIAGNOSIS — Z0000 Encounter for general adult medical examination without abnormal findings: Secondary | ICD-10-CM | POA: Diagnosis not present

## 2017-09-17 NOTE — Progress Notes (Signed)
Subjective:   Douglas Lowery is a 76 y.o. male who presents for Medicare Annual/Subsequent preventive examination.  Last AWV-08/19/2016       Objective:    Vitals: BP (!) 164/84 (BP Location: Left Arm, Patient Position: Sitting)   Pulse 68   Temp 98.5 F (36.9 C) (Oral)   Ht 5\' 7"  (1.702 m)   Wt 181 lb (82.1 kg)   SpO2 96%   BMI 28.35 kg/m   Body mass index is 28.35 kg/m.  Advanced Directives 09/17/2017 05/19/2017 02/13/2017 01/10/2017 12/22/2016 12/06/2016 08/19/2016  Does Patient Have a Medical Advance Directive? No - No No No No No;Yes  Type of Advance Directive - - - - - - Living will  Does patient want to make changes to medical advance directive? - - No - Patient declined - - - -  Copy of [place] in Chart? - - - - - - No - copy requested  Would patient like information on creating a medical advance directive? Yes (ED - Information included in AVS) No - Patient declined - No - Patient declined - - -    Tobacco Social History   Tobacco Use  Smoking Status Never Smoker  Smokeless Tobacco Never Used     Counseling given: Not Answered   Clinical Intake:  Pre-visit preparation completed: No  Pain : No/denies pain     Nutritional Risks: None Diabetes: No  How often do you need to have someone help you when you read instructions, pamphlets, or other written materials from your doctor or pharmacy?: 1 - Never What is the last grade level you completed in school?: Bachelors degree  Interpreter Needed?: No  Information entered by :: Tyson Dense, RN  Past Medical History:  Diagnosis Date  . Allergic rhinitis    seasonal  . BPH (benign prostatic hyperplasia)   . Carotid bruit    Carotid US 5/18:  1-39% RICA stenosis; Prominent R subclavian artery, measuring 1.4 cm AP x 1.5 cm TRV. - FU prn  . Cervicalgia 02/28/2016  . Chronic kidney disease   . Hypercholesteremia   . Hypertension    Past Surgical History:  Procedure Laterality Date    . COLONOSCOPY  02/2009   Sessile polyp in the rectum. Otherwise normal examination  . EYE SURGERY  2013   Right eye cataract removed--Dr. Katy Fitch   Family History  Problem Relation Age of Onset  . Pneumonia Father   . Alzheimer's disease Sister   . Hypertension Sister   . Hypertension Sister   . Hypertension Sister   . Heart attack Neg Hx    Social History   Socioeconomic History  . Marital status: Single    Spouse name: None  . Number of children: None  . Years of education: None  . Highest education level: None  Social Needs  . Financial resource strain: Not hard at all  . Food insecurity - worry: Never true  . Food insecurity - inability: Never true  . Transportation needs - medical: No  . Transportation needs - non-medical: No  Occupational History  . Occupation: Retired  Tobacco Use  . Smoking status: Never Smoker  . Smokeless tobacco: Never Used  Substance and Sexual Activity  . Alcohol use: Yes    Comment: 1 can beer every week  . Drug use: No  . Sexual activity: Yes  Other Topics Concern  . 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   Single   4 children; 6 grandchildren    Outpatient Encounter Medications as of 09/17/2017  Medication Sig  . AMLODIPINE BESYLATE PO Take 20 mg daily by mouth. TAKE 1 20 MG TABLET PO DAILY  . aspirin 81 MG chewable tablet Chew 81 mg by mouth daily.  . fenofibrate 54 MG tablet Take one tablet by mouth once daily  . lisinopril (PRINIVIL,ZESTRIL) 40 MG tablet Take 1 tablet (40 mg total) by mouth daily.  . metoprolol (TOPROL-XL) 200 MG 24 hr tablet Take 1 tablet (200 mg total) by mouth daily.  . mometasone (NASONEX) 50 MCG/ACT nasal spray 2 sprays by Each Nare route nightly.  . pravastatin (PRAVACHOL) 40 MG tablet Take one tablet by mouth once daily for cholesterol  . silodosin (RAPAFLO) 4 MG CAPS capsule Take 1 capsule (4 mg total) by mouth daily with breakfast.  . solifenacin  (VESICARE) 5 MG tablet Take 5 mg by mouth daily.   No facility-administered encounter medications on file as of 09/17/2017.     Activities of Daily Living In your present state of health, do you have any difficulty performing the following activities: 09/17/2017  Hearing? N  Vision? N  Difficulty concentrating or making decisions? N  Walking or climbing stairs? N  Dressing or bathing? N  Doing errands, shopping? N  Preparing Food and eating ? N  Using the Toilet? N  In the past six months, have you accidently leaked urine? N  Do you have problems with loss of bowel control? N  Managing your Medications? N  Managing your Finances? N  Housekeeping or managing your Housekeeping? N  Some recent data might be hidden    Patient Care Team: Gayland Curry, DO as PCP - General (Geriatric Medicine) Clent Jacks, MD as Consulting Physician (Ophthalmology) Zadie Rhine Clent Demark, MD as Consulting Physician (Ophthalmology)   Assessment:   This is a routine wellness examination for [place].  Exercise Activities and Dietary recommendations Current Exercise Habits: Home exercise routine, Type of exercise: walking, Time (Minutes): 10, Frequency (Times/Week): 3, Weekly Exercise (Minutes/Week): 30, Intensity: Mild, Exercise limited by: None identified  Goals    None      Fall Risk Fall Risk  09/17/2017 05/19/2017 02/13/2017 12/20/2016 12/06/2016  Falls in the past year? No No No No No  Risk for fall due to : - - - - -  Risk for fall due to: Comment - - - - -   Is the patient's home free of loose throw rugs in walkways, pet beds, electrical cords, etc?   yes      Grab bars in the bathroom? yes      Handrails on the stairs?   yes      Adequate lighting?   yes  Timed Get Up and Go Performed: 15 seconds, fall risk  Depression Screen PHQ 2/9 Scores 09/17/2017 05/19/2017 02/13/2017 12/06/2016  PHQ - 2 Score 0 0 0 0    Cognitive Function MMSE - Mini Mental State Exam 09/17/2017 08/19/2016 12/02/2013    Orientation to time 4 5 5   Orientation to Place 5 5 5   Registration 3 3 3   Attention/ Calculation 5 5 5   Recall 3 3 2   Language- name 2 objects 2 2 2   Language- repeat 1 1 1   Language- follow 3 step command 3 2 3   Language- read & follow direction 1 1 1   Write a sentence 1 1 1   Copy design 1 1 1   Total score 29 29  29        Immunization History  Administered Date(s) Administered  . DTaP 10/31/2014  . Influenza, High Dose Seasonal PF 05/19/2017  . Influenza,inj,Quad PF,6+ Mos 06/24/2013, 06/09/2014, 07/06/2015, 08/19/2016  . Influenza-Unspecified 06/12/2010, 07/08/2011  . Pneumococcal Conjugate-13 11/03/2014  . Pneumococcal Polysaccharide-23 07/31/2009  . Td 09/10/2016  . Zoster 10/31/2014    Qualifies for Shingles Vaccine? Yes, educated and declined  Screening Tests Health Maintenance  Topic Date Due  . TETANUS/TDAP  09/10/2026  . INFLUENZA VACCINE  Completed  . PNA vac Low Risk Adult  Completed   Cancer Screenings: Lung: Low Dose CT Chest recommended if Age 37-80 years, 30 pack-year currently smoking OR have quit w/in 15years. Patient does not qualify. Colorectal: up to date  Additional Screenings:  Hepatitis B/HIV/Syphillis:Not indicated Hepatitis C Screening: Not indicated    Plan:    I have personally reviewed and addressed the Medicare Annual Wellness questionnaire and have noted the following in the patient's chart:  A. Medical and social history B. Use of alcohol, tobacco or illicit drugs  C. Current medications and supplements D. Functional ability and status E.  Nutritional status F.  Physical activity G. Advance directives H. List of other physicians I.  Hospitalizations, surgeries, and ER visits in previous 12 months [place] to include hearing, vision, cognitive, depression L. Referrals and appointments - none  In addition, I have reviewed and discussed with patient certain preventive protocols, quality metrics, and best  practice recommendations. A written personalized care plan for preventive services as well as general preventive health recommendations were provided to patient.  See attached scanned questionnaire for additional information.   Signed,   Tyson Dense, RN Nurse Health Advisor   Quick Notes   Health Maintenance: Pt declined Shingirx     Abnormal Screen: MMSE 29/30. Passed clock. 1st BP 180/80  2nd BP 164/84      Patient Concerns: None     Nurse Concerns: None

## 2017-09-17 NOTE — Patient Instructions (Signed)
Douglas Lowery , Thank you for taking time to come for your Medicare Wellness Visit. I appreciate your ongoing commitment to your health goals. Please review the following plan we discussed and let me know if I can assist you in the future.   Screening recommendations/referrals: Colonoscopy up to date, due 08/24/2022 Recommended yearly ophthalmology/optometry visit for glaucoma screening and checkup Recommended yearly dental visit for hygiene and checkup  Vaccinations: Influenza vaccine up to date. Due 2019 fall season Pneumococcal vaccine up to date Tdap vaccine up to date. Due 09/10/2026 Shingles vaccine due, declined for now    Advanced directives: Advance directive discussed with you today. I have provided a copy for you to complete at home and have notarized. Once this is complete please bring a copy in to our office so we can scan it into your chart.  Conditions/risks identified: None  Next appointment: Dr. Mariea Clonts 09/25/2017 @ 8:30am  Preventive Care 65 Years and Older, Male Preventive care refers to lifestyle choices and visits with your health care provider that can promote health and wellness. What does preventive care include?  A yearly physical exam. This is also called an annual well check.  Dental exams once or twice a year.  Routine eye exams. Ask your health care provider how often you should have your eyes checked.  Personal lifestyle choices, including:  Daily care of your teeth and gums.  Regular physical activity.  Eating a healthy diet.  Avoiding tobacco and drug use.  Limiting alcohol use.  Practicing safe sex.  Taking low doses of aspirin every day.  Taking vitamin and mineral supplements as recommended by your health care provider. What happens during an annual well check? The services and screenings done by your health care provider during your annual well check will depend on your age, overall health, lifestyle risk factors, and family history of  disease. Counseling  Your health care provider may ask you questions about your:  Alcohol use.  Tobacco use.  Drug use.  Emotional well-being.  Home and relationship well-being.  Sexual activity.  Eating habits.  History of falls.  Memory and ability to understand (cognition).  Work and work Statistician. Screening  You may have the following tests or measurements:  Height, weight, and BMI.  Blood pressure.  Lipid and cholesterol levels. These may be checked every 5 years, or more frequently if you are over 59 years old.  Skin check.  Lung cancer screening. You may have this screening every year starting at age 76 if you have a 30-pack-year history of smoking and currently smoke or have quit within the past 15 years.  Fecal occult blood test (FOBT) of the stool. You may have this test every year starting at age 76.  Flexible sigmoidoscopy or colonoscopy. You may have a sigmoidoscopy every 5 years or a colonoscopy every 10 years starting at age 76.  Prostate cancer screening. Recommendations will vary depending on your family history and other risks.  Hepatitis C blood test.  Hepatitis B blood test.  Sexually transmitted disease (STD) testing.  Diabetes screening. This is done by checking your blood sugar (glucose) after you have not eaten for a while (fasting). You may have this done every 1-3 years.  Abdominal aortic aneurysm (AAA) screening. You may need this if you are a current or former smoker.  Osteoporosis. You may be screened starting at age 15 if you are at high risk. Talk with your health care provider about your test results, treatment options, and if  necessary, the need for more tests. Vaccines  Your health care provider may recommend certain vaccines, such as:  Influenza vaccine. This is recommended every year.  Tetanus, diphtheria, and acellular pertussis (Tdap, Td) vaccine. You may need a Td booster every 10 years.  Zoster vaccine. You may  need this after age 47.  Pneumococcal 13-valent conjugate (PCV13) vaccine. One dose is recommended after age 16.  Pneumococcal polysaccharide (PPSV23) vaccine. One dose is recommended after age 50. Talk to your health care provider about which screenings and vaccines you need and how often you need them. This information is not intended to replace advice given to you by your health care provider. Make sure you discuss any questions you have with your health care provider. Document Released: 10/13/2015 Document Revised: 06/05/2016 Document Reviewed: 07/18/2015 Elsevier Interactive Patient Education  2017 [place] Prevention in the Home Falls can cause injuries. They can happen to people of all ages. There are many things you can do to make your home safe and to help prevent falls. What can I do on the outside of my home?  Regularly fix the edges of walkways and driveways and fix any cracks.  Remove anything that might make you trip as you walk through a door, such as a raised step or threshold.  Trim any bushes or trees on the path to your home.  Use bright outdoor lighting.  Clear any walking paths of anything that might make someone trip, such as rocks or tools.  Regularly check to see if handrails are loose or broken. Make sure that both sides of any steps have handrails.  Any raised decks and porches should have guardrails on the edges.  Have any leaves, snow, or ice cleared regularly.  Use sand or salt on walking paths during winter.  Clean up any spills in your garage right away. This includes oil or grease spills. What can I do in the bathroom?  Use night lights.  Install grab bars by the toilet and in the tub and shower. Do not use towel bars as grab bars.  Use non-skid mats or decals in the tub or shower.  If you need to sit down in the shower, use a plastic, non-slip stool.  Keep the floor dry. Clean up any water that spills on the floor as soon as it  happens.  Remove soap buildup in the tub or shower regularly.  Attach bath mats securely with double-sided non-slip rug tape.  Do not have throw rugs and other things on the floor that can make you trip. What can I do in the bedroom?  Use night lights.  Make sure that you have a light by your bed that is easy to reach.  Do not use any sheets or blankets that are too big for your bed. They should not hang down onto the floor.  Have a firm chair that has side arms. You can use this for support while you get dressed.  Do not have throw rugs and other things on the floor that can make you trip. What can I do in the kitchen?  Clean up any spills right away.  Avoid walking on wet floors.  Keep items that you use a lot in easy-to-reach places.  If you need to reach something above you, use a strong step stool that has a grab bar.  Keep electrical cords out of the way.  Do not use floor polish or wax that makes floors slippery. If you must  use wax, use non-skid floor wax.  Do not have throw rugs and other things on the floor that can make you trip. What can I do with my stairs?  Do not leave any items on the stairs.  Make sure that there are handrails on both sides of the stairs and use them. Fix handrails that are broken or loose. Make sure that handrails are as long as the stairways.  Check any carpeting to make sure that it is firmly attached to the stairs. Fix any carpet that is loose or worn.  Avoid having throw rugs at the top or bottom of the stairs. If you do have throw rugs, attach them to the floor with carpet tape.  Make sure that you have a light switch at the top of the stairs and the bottom of the stairs. If you do not have them, ask someone to add them for you. What else can I do to help prevent falls?  Wear shoes that:  Do not have high heels.  Have rubber bottoms.  Are comfortable and fit you well.  Are closed at the toe. Do not wear sandals.  If you  use a stepladder:  Make sure that it is fully opened. Do not climb a closed stepladder.  Make sure that both sides of the stepladder are locked into place.  Ask someone to hold it for you, if possible.  Clearly mark and make sure that you can see:  Any grab bars or handrails.  First and last steps.  Where the edge of each step is.  Use tools that help you move around (mobility aids) if they are needed. These include:  Canes.  Walkers.  Scooters.  Crutches.  Turn on the lights when you go into a dark area. Replace any light bulbs as soon as they burn out.  Set up your furniture so you have a clear path. Avoid moving your furniture around.  If any of your floors are uneven, fix them.  If there are any pets around you, be aware of where they are.  Review your medicines with your doctor. Some medicines can make you feel dizzy. This can increase your chance of falling. Ask your doctor what other things that you can do to help prevent falls. This information is not intended to replace advice given to you by your health care provider. Make sure you discuss any questions you have with your health care provider. Document Released: 07/13/2009 Document Revised: 02/22/2016 Document Reviewed: 10/21/2014 Elsevier Interactive Patient Education  2017 [employer].

## 2017-09-25 ENCOUNTER — Encounter: Payer: Self-pay | Admitting: Internal Medicine

## 2017-09-25 ENCOUNTER — Ambulatory Visit (INDEPENDENT_AMBULATORY_CARE_PROVIDER_SITE_OTHER): Payer: Medicare Other | Admitting: Internal Medicine

## 2017-09-25 VITALS — BP 158/80 | HR 57 | Temp 97.6°F | Wt 179.0 lb

## 2017-09-25 DIAGNOSIS — N183 Chronic kidney disease, stage 3 unspecified: Secondary | ICD-10-CM

## 2017-09-25 DIAGNOSIS — R350 Frequency of micturition: Secondary | ICD-10-CM

## 2017-09-25 DIAGNOSIS — K219 Gastro-esophageal reflux disease without esophagitis: Secondary | ICD-10-CM | POA: Diagnosis not present

## 2017-09-25 DIAGNOSIS — N401 Enlarged prostate with lower urinary tract symptoms: Secondary | ICD-10-CM | POA: Diagnosis not present

## 2017-09-25 DIAGNOSIS — I10 Essential (primary) hypertension: Secondary | ICD-10-CM | POA: Diagnosis not present

## 2017-09-25 DIAGNOSIS — J310 Chronic rhinitis: Secondary | ICD-10-CM

## 2017-09-25 LAB — BASIC METABOLIC PANEL
BUN/Creatinine Ratio: 15 (calc) (ref 6–22)
BUN: 22 mg/dL (ref 7–25)
CO2: 20 mmol/L (ref 20–32)
Calcium: 10.3 mg/dL (ref 8.6–10.3)
Chloride: 105 mmol/L (ref 98–110)
Creat: 1.47 mg/dL — ABNORMAL HIGH (ref 0.70–1.18)
Glucose, Bld: 126 mg/dL — ABNORMAL HIGH (ref 65–99)
Potassium: 5.4 mmol/L — ABNORMAL HIGH (ref 3.5–5.3)
Sodium: 137 mmol/L (ref 135–146)

## 2017-09-25 MED ORDER — OMEPRAZOLE 20 MG PO CPDR: 20.0000 mg | DELAYED_RELEASE_CAPSULE | Freq: Every day | ORAL | 3 refills | Status: DC

## 2017-09-25 NOTE — Patient Instructions (Addendum)
Take 2 amlodipine 10mg  tablets for your blood pressure.  Try taking omeprazole each night before your evening meal for acid reflux.    Gastroesophageal Reflux Disease, Adult Normally, food travels down the esophagus and stays in the stomach to be digested. If a person has gastroesophageal reflux disease (GERD), food and stomach acid move back up into the esophagus. When this happens, the esophagus becomes sore and swollen (inflamed). Over time, GERD can make small holes (ulcers) in the lining of the esophagus. Follow these instructions at home: Diet  Follow a diet as told by your doctor. You may need to avoid foods and drinks such as: ? Coffee and tea (with or without caffeine). ? Drinks that contain alcohol. ? Energy drinks and sports drinks. ? Carbonated drinks or sodas. ? Chocolate and cocoa. ? Peppermint and mint flavorings. ? Garlic and onions. ? Horseradish. ? Spicy and acidic foods, such as peppers, chili powder, curry powder, vinegar, hot sauces, and BBQ sauce. ? Citrus fruit juices and citrus fruits, such as oranges, lemons, and limes. ? Tomato-based foods, such as red sauce, chili, salsa, and pizza with red sauce. ? Fried and fatty foods, such as donuts, french fries, potato chips, and high-fat dressings. ? High-fat meats, such as hot dogs, rib eye steak, sausage, ham, and bacon. ? High-fat dairy items, such as whole milk, butter, and cream cheese.  Eat small meals often. Avoid eating large meals.  Avoid drinking large amounts of liquid with your meals.  Avoid eating meals during the 2-3 hours before bedtime.  Avoid lying down right after you eat.  Do not exercise right after you eat. General instructions  Pay attention to any changes in your symptoms.  Take over-the-counter and prescription medicines only as told by your doctor. Do not take aspirin, ibuprofen, or other NSAIDs unless your doctor says it is okay.  Do not use any tobacco products, including cigarettes,  chewing tobacco, and e-cigarettes. If you need help quitting, ask your doctor.  Wear loose clothes. Do not wear anything tight around your waist.  Raise (elevate) the head of your bed about 6 inches (15 cm).  Try to lower your stress. If you need help doing this, ask your doctor.  If you are overweight, lose an amount of weight that is healthy for you. Ask your doctor about a safe weight loss goal.  Keep all follow-up visits as told by your doctor. This is important. Contact a doctor if:  You have new symptoms.  You lose weight and you do not know why it is happening.  You have trouble swallowing, or it hurts to swallow.  You have wheezing or a cough that keeps happening.  Your symptoms do not get better with treatment.  You have a hoarse voice. Get help right away if:  You have pain in your arms, neck, jaw, teeth, or back.  You feel sweaty, dizzy, or light-headed.  You have chest pain or shortness of breath.  You throw up (vomit) and your throw up looks like blood or coffee grounds.  You pass out (faint).  Your poop (stool) is bloody or black.  You cannot swallow, drink, or eat. This information is not intended to replace advice given to you by your health care provider. Make sure you discuss any questions you have with your health care provider. Document Released: 03/04/2008 Document Revised: 02/22/2016 Document Reviewed: 01/11/2015 Elsevier Interactive Patient Education  [employer].

## 2017-09-25 NOTE — Progress Notes (Signed)
Location:  [facility] clinic Provider:  Niraj Kudrna L. Mariea Clonts, D.O., C.M.D.  Code Status: full code Goals of Care:  Advanced Directives 09/17/2017  Does Patient Have a Medical Advance Directive? No  Type of Advance Directive -  Does patient want to make changes to medical advance directive? -  Copy of [place] in Chart? -  Would patient like information on creating a medical advance directive? Yes (ED - Information included in AVS)   Chief Complaint  Patient presents with  . Medical Management of Chronic Issues    24mth follow-up    HPI: Patient is a 76 y.o. male seen today for medical management of chronic diseases.    BP high as always, but a little better than usual.  Seems to come up being here.  He is to be on 20mg  of amlodipine instead of 10mg .  He has 10mg  pills probably b/c it does not come in 20mg .  Explained this to him.    Went to ENT again 11/30 and he was diagnosed with laryngopharyngeal reflux and chronic rhinitis.  He is not on a PPI and says cough worse at night and when he gets warm? Using nasonex.    CKD:  Needs f/u bmp.  Avoiding nsaids.    BPH:  No change is prostate symptoms/LUTS.  On vesicare and rapaflo.      Past Medical History:  Diagnosis Date  . Allergic rhinitis    seasonal  . BPH (benign prostatic hyperplasia)   . Carotid bruit    Carotid US 5/18:  1-39% RICA stenosis; Prominent R subclavian artery, measuring 1.4 cm AP x 1.5 cm TRV. - FU prn  . Cervicalgia 02/28/2016  . Chronic kidney disease   . Hypercholesteremia   . Hypertension     Past Surgical History:  Procedure Laterality Date  . COLONOSCOPY  02/2009   Sessile polyp in the rectum. Otherwise normal examination  . EYE SURGERY  2013   Right eye cataract removed--Dr. Katy Fitch    No Known Allergies  Outpatient Encounter Medications as of 09/25/2017  Medication Sig  . AMLODIPINE BESYLATE PO Take 20 mg by mouth daily.   Marland Kitchen aspirin 81 MG chewable tablet Chew 81 mg by mouth  daily.  . fenofibrate 54 MG tablet Take one tablet by mouth once daily  . lisinopril (PRINIVIL,ZESTRIL) 40 MG tablet Take 1 tablet (40 mg total) by mouth daily.  . metoprolol (TOPROL-XL) 200 MG 24 hr tablet Take 1 tablet (200 mg total) by mouth daily.  . mometasone (NASONEX) 50 MCG/ACT nasal spray 2 sprays by Each Nare route nightly.  . pravastatin (PRAVACHOL) 40 MG tablet Take one tablet by mouth once daily for cholesterol  . silodosin (RAPAFLO) 4 MG CAPS capsule Take 1 capsule (4 mg total) by mouth daily with breakfast.  . solifenacin (VESICARE) 5 MG tablet Take 5 mg by mouth daily.   No facility-administered encounter medications on file as of 09/25/2017.     Review of Systems:  Review of Systems  Constitutional: Negative for chills and fever.  HENT: Negative for congestion.   Eyes: Negative for blurred vision.  Respiratory: Negative for shortness of breath.   Cardiovascular: Negative for chest pain and palpitations.  Gastrointestinal: Negative for abdominal pain, blood in stool, constipation and melena.  Genitourinary: Positive for urgency. Negative for dysuria and frequency.  Musculoskeletal: Negative for falls and joint pain.  Skin: Negative for itching and rash.  Neurological: Negative for dizziness.  Psychiatric/Behavioral: Negative for depression and memory  loss.    Health Maintenance  Topic Date Due  . TETANUS/TDAP  09/10/2026  . INFLUENZA VACCINE  Completed  . PNA vac Low Risk Adult  Completed    Physical Exam: Vitals:   09/25/17 0832  BP: (!) 158/80  Pulse: (!) 57  Temp: 97.6 F (36.4 C)  TempSrc: Oral  SpO2: 98%  Weight: 179 lb (81.2 kg)   Body mass index is 28.04 kg/m. Physical Exam  Constitutional: He is oriented to person, place, and time. He appears well-developed and well-nourished. No distress.  Cardiovascular: Normal rate, regular rhythm, normal heart sounds and intact distal pulses.  Pulmonary/Chest: Effort normal and breath sounds normal. No  respiratory distress.  Abdominal: Soft. Bowel sounds are normal.  Musculoskeletal: Normal range of motion.  Neurological: He is alert and oriented to person, place, and time.  Skin: Skin is warm and dry.  Psychiatric: He has a normal mood and affect.    Labs reviewed: Basic Metabolic Panel: Recent Labs    01/21/17 0852 02/28/17 0806 07/14/17 0124  NA 140 137 135  K 4.8 4.5 4.1  CL 100 103 103  CO2 22 19 23   GLUCOSE 121* 102* 126*  BUN 15 16 23*  CREATININE 1.21 1.19 1.44*  CALCIUM 9.9 9.9 9.4   Liver Function Tests: Recent Labs    01/10/17 0950  AST 17  ALT 20  ALKPHOS 91  BILITOT 0.6  PROT 7.1  ALBUMIN 3.9   No results for input(s): LIPASE, AMYLASE in the last 8760 hours. No results for input(s): AMMONIA in the last 8760 hours. CBC: Recent Labs    07/14/17 0124  WBC 7.1  NEUTROABS 4.9  HGB 14.4  HCT 44.6  MCV 95.9  PLT 259   Lipid Panel: Recent Labs    01/10/17 0950  CHOL 169  HDL 46  LDLCALC 77  TRIG 231*  CHOLHDL 3.7   Lab Results  Component Value Date   HGBA1C 5.3 01/10/2017    Assessment/Plan 1. Laryngopharyngeal reflux (LPR) -add ppi, cont nasal spray per ENT - omeprazole (PRILOSEC) 20 MG capsule; Take 1 capsule (20 mg total) by mouth daily before supper.  Dispense: 30 capsule; Refill: 3  2. Chronic rhinitis -cont nasal spray  3. Essential hypertension, benign -bp still high, but not taking 20mg  of amlodipine as advised by cardiology, just 10mg --educated on this and he will begin taking 2 of the 10mg  pills - Basic metabolic panel  4. Chronic kidney disease, stage 3 (HCC) - need to f/u on renal function (was up at ED), Avoid nephrotoxic agents like nsaids, dose adjust renally excreted meds, hydrate. - Basic metabolic panel  5. Benign prostatic hyperplasia with urinary frequency -cont rapaflo, and vesicare  Labs/tests ordered:   Orders Placed This Encounter  Procedures  . Basic metabolic panel    Order Specific Question:   Has  the patient fasted?    Answer:   Yes    Next appt:  4 mos med mgt  Cresencio Reesor L. Chidi Shirer, D.O. [facility] [address], [place] [address] Cell Phone (Mon-Fri 8am-5pm):  [phone] On Call:  [phone] & follow prompts after 5pm & weekends Office Phone:  [phone] Office Fax:  [phone]

## 2017-09-29 ENCOUNTER — Encounter: Payer: Self-pay | Admitting: *Deleted

## 2017-10-06 DIAGNOSIS — K219 Gastro-esophageal reflux disease without esophagitis: Secondary | ICD-10-CM | POA: Diagnosis not present

## 2017-10-06 DIAGNOSIS — H4030X0 Glaucoma secondary to eye trauma, unspecified eye, stage unspecified: Secondary | ICD-10-CM | POA: Diagnosis not present

## 2017-10-06 DIAGNOSIS — J310 Chronic rhinitis: Secondary | ICD-10-CM | POA: Diagnosis not present

## 2017-10-06 DIAGNOSIS — H35011 Changes in retinal vascular appearance, right eye: Secondary | ICD-10-CM | POA: Diagnosis not present

## 2017-10-06 DIAGNOSIS — H21552 Recession of chamber angle, left eye: Secondary | ICD-10-CM | POA: Diagnosis not present

## 2017-10-06 DIAGNOSIS — R05 Cough: Secondary | ICD-10-CM | POA: Diagnosis not present

## 2017-10-06 DIAGNOSIS — H353131 Nonexudative age-related macular degeneration, bilateral, early dry stage: Secondary | ICD-10-CM | POA: Diagnosis not present

## 2017-10-06 DIAGNOSIS — H35352 Cystoid macular degeneration, left eye: Secondary | ICD-10-CM | POA: Diagnosis not present

## 2017-10-09 ENCOUNTER — Ambulatory Visit: Payer: Medicare Other | Admitting: Nurse Practitioner

## 2017-10-13 ENCOUNTER — Telehealth: Payer: Self-pay | Admitting: Interventional Cardiology

## 2017-10-13 DIAGNOSIS — I10 Essential (primary) hypertension: Secondary | ICD-10-CM

## 2017-10-13 NOTE — Telephone Encounter (Signed)
New message    Pt c/o medication issue:  1. Name of Medication: lisinopril   2. How are you currently taking this medication (dosage and times per day)? 40 mg  3. Are you having a reaction (difficulty breathing--STAT)? cough  4. What is your medication issue? Per pt, ENT doctor told him to talk to doctor about this medication. They said his cough could possibly be from medication.

## 2017-10-13 NOTE — Telephone Encounter (Signed)
OK to stop lisinopril and start losartan 100 mg daily.  BMet in one week.

## 2017-10-13 NOTE — Telephone Encounter (Signed)
Spoke with patient about a cough he developed about 5 months ago soon after starting lisinopril.  I informed him that I would follow up with this and he verbalized an understanding.

## 2017-10-14 ENCOUNTER — Telehealth: Payer: Self-pay

## 2017-10-14 MED ORDER — LOSARTAN POTASSIUM 100 MG PO TABS: 100.0000 mg | ORAL_TABLET | Freq: Every day | ORAL | 3 refills | Status: DC

## 2017-10-14 NOTE — Telephone Encounter (Signed)
Patient walked into office today to have me show Dr. Mariea Clonts his medications. Pt was upset due to the fact that he walked out of his appointment on 10/09/16 because he was asked for his insurance information but he refused to give it. He stated that it should be on file and refused to provide insurance or sign AOB for visit.   When he came in today, he got upset with me due to the fact that I could not take his medications and show them to Dr. Mariea Clonts. I suggested that patient schedule another appointment and patient became angry. He began raising his voice stating that he had an appointment but wasn't seen, I then suggested again that he schedule another appointment. Pt replied that he was here today talking to me so he did not need another appointment. I told patient that I could not do anything with his medications without direction from a provider with this office. He then demanded that I write my name down for. I wrote my first name down and gave it to patient.

## 2017-10-14 NOTE — Telephone Encounter (Signed)
Left message for patient to call back  

## 2017-10-14 NOTE — Telephone Encounter (Signed)
Patient came in very agitated said he wanted to speak to Dr. Mariea Clonts. I explained that Dr. Mariea Clonts was not here. He said he did not want to talk to me that he wanted to speak with one of the girls in the back. (Patient was very rude last week when I tried to check him in for his appointment, he stated I asked him too many questions and when I inquired about his insurance he stated he received a new card but refused to give it to me he stated we should have it on file and I didn't need it. When I tried to explain to him that I was just trying to verify his information was correct he told me I asked too many questions and he wasn't signing anything and he wasn't going to stay for his appointment because I asked too many questions)  When patient left today he asked for my name, I told him my name was Earlie Server and he said WRITE IT. I wrote it on a post it note for patient he snatched it out of my hand & walked out.

## 2017-10-14 NOTE — Telephone Encounter (Signed)
Made patient aware of Dr. Hassell Done recommendations to stop Lisinopril and start Losartan 100 mg QD. Patient verbalizes understanding and is in agreement with this plan. Patient will monitor BP with medication change and return for BMET on 1/22.

## 2017-10-20 ENCOUNTER — Telehealth: Payer: Self-pay | Admitting: Internal Medicine

## 2017-10-20 NOTE — Telephone Encounter (Signed)
I talked with Mr. Romey regarding his call to [facility]iences. I explained our  registrations process. I explained to Mr. Polidori the importance of verifying insurance information at each visit.  Told him demographic informations changes sometime between visit, and we want to make sure all of his information is correct and verified.   He understood.  Mr.Binion also had a conversation with the CMA, stated he  wanted to update his medications list.  His Cardiologist Dr. Donaciano Eva (sp)  discontinue Lisinopril 40 mg (caused him to cough) and started him on Losartan 100mg  QD beginning 10/13/2017. Gave updated info to Pratt Coralyn Mark) for update...[facility]

## 2017-10-21 ENCOUNTER — Other Ambulatory Visit: Payer: Medicare Other

## 2017-10-21 DIAGNOSIS — N401 Enlarged prostate with lower urinary tract symptoms: Secondary | ICD-10-CM | POA: Diagnosis not present

## 2017-10-21 DIAGNOSIS — R3915 Urgency of urination: Secondary | ICD-10-CM | POA: Diagnosis not present

## 2017-11-04 ENCOUNTER — Encounter: Payer: Self-pay | Admitting: Internal Medicine

## 2017-11-04 DIAGNOSIS — H40053 Ocular hypertension, bilateral: Secondary | ICD-10-CM | POA: Diagnosis not present

## 2017-11-04 DIAGNOSIS — H26491 Other secondary cataract, right eye: Secondary | ICD-10-CM | POA: Diagnosis not present

## 2017-11-04 DIAGNOSIS — Z961 Presence of intraocular lens: Secondary | ICD-10-CM | POA: Diagnosis not present

## 2017-11-04 LAB — HM DIABETES EYE EXAM

## 2017-11-06 ENCOUNTER — Ambulatory Visit (INDEPENDENT_AMBULATORY_CARE_PROVIDER_SITE_OTHER): Payer: Medicare Other | Admitting: Internal Medicine

## 2017-11-06 ENCOUNTER — Encounter: Payer: Self-pay | Admitting: Internal Medicine

## 2017-11-06 VITALS — BP 140/80 | HR 55 | Temp 98.4°F | Wt 177.0 lb

## 2017-11-06 DIAGNOSIS — N401 Enlarged prostate with lower urinary tract symptoms: Secondary | ICD-10-CM

## 2017-11-06 DIAGNOSIS — R739 Hyperglycemia, unspecified: Secondary | ICD-10-CM | POA: Diagnosis not present

## 2017-11-06 DIAGNOSIS — E782 Mixed hyperlipidemia: Secondary | ICD-10-CM | POA: Diagnosis not present

## 2017-11-06 DIAGNOSIS — N183 Chronic kidney disease, stage 3 unspecified: Secondary | ICD-10-CM

## 2017-11-06 DIAGNOSIS — E663 Overweight: Secondary | ICD-10-CM

## 2017-11-06 DIAGNOSIS — R350 Frequency of micturition: Secondary | ICD-10-CM | POA: Diagnosis not present

## 2017-11-06 DIAGNOSIS — I10 Essential (primary) hypertension: Secondary | ICD-10-CM

## 2017-11-06 NOTE — Progress Notes (Signed)
Location:  [facility] clinic Provider:  Nacole Fluhr L. Mariea Clonts, D.O., C.M.D.  Code Status: full code Goals of Care:  Advanced Directives 09/17/2017  Does Patient Have a Medical Advance Directive? No  Type of Advance Directive -  Does patient want to make changes to medical advance directive? -  Copy of [place] in Chart? -  Would patient like information on creating a medical advance directive? Yes (ED - Information included in AVS)     Chief Complaint  Patient presents with  . Medical Management of Chronic Issues    follow-up on Blood pressure    HPI: Patient is a 77 y.o. male seen today for medical management of chronic diseases.    We have already given him a blank advance directive form that he has not yet completed or gotten notarized.    137, 152, one time down to 606 systolic.  Today 140/80.  He says he can do better about drinking water.  Says he's going to start carrying a water bottle.  He used to drink sodas, but only 1-2 in the past couple of mos.  Suspect losartan may be reason for bump in creatinine lately.      No difficulty with urinating since change in rapaflo dose and vesicare.  Says he is taking a 2mg  rapaflo.    He has lost a little weight. He's exercising--walking and some lifting of items. Down about 4 lbs.    Past Medical History:  Diagnosis Date  . Allergic rhinitis    seasonal  . BPH (benign prostatic hyperplasia)   . Carotid bruit    Carotid US 5/18:  1-39% RICA stenosis; Prominent R subclavian artery, measuring 1.4 cm AP x 1.5 cm TRV. - FU prn  . Cervicalgia 02/28/2016  . Chronic kidney disease   . Hypercholesteremia   . Hypertension     Past Surgical History:  Procedure Laterality Date  . COLONOSCOPY  02/2009   Sessile polyp in the rectum. Otherwise normal examination  . EYE SURGERY  2013   Right eye cataract removed--Dr. Katy Fitch    Allergies  Allergen Reactions  . Lisinopril Cough    Outpatient Encounter Medications as of  11/06/2017  Medication Sig  . AMLODIPINE BESYLATE PO Take 20 mg by mouth daily.   Marland Kitchen aspirin 81 MG chewable tablet Chew 81 mg by mouth daily.  . fenofibrate 54 MG tablet Take one tablet by mouth once daily  . losartan (COZAAR) 100 MG tablet Take 1 tablet (100 mg total) by mouth daily.  . metoprolol (TOPROL-XL) 200 MG 24 hr tablet Take 1 tablet (200 mg total) by mouth daily.  . mometasone (NASONEX) 50 MCG/ACT nasal spray 2 sprays by Each Nare route nightly.  Marland Kitchen omeprazole (PRILOSEC) 20 MG capsule Take 1 capsule (20 mg total) by mouth daily before supper.  . pravastatin (PRAVACHOL) 40 MG tablet Take one tablet by mouth once daily for cholesterol  . silodosin (RAPAFLO) 4 MG CAPS capsule Take 1 capsule (4 mg total) by mouth daily with breakfast.  . solifenacin (VESICARE) 5 MG tablet Take 5 mg by mouth daily.   No facility-administered encounter medications on file as of 11/06/2017.     Review of Systems:  Review of Systems  Constitutional: Positive for weight loss. Negative for chills, fever and malaise/fatigue.       3 lbs  HENT: Negative for congestion and hearing loss.   Eyes: Negative for blurred vision.       Glasses  Respiratory: Negative  for cough and shortness of breath.   Cardiovascular: Negative for chest pain, palpitations and leg swelling.  Gastrointestinal: Negative for abdominal pain, blood in stool, constipation and melena.  Genitourinary: Negative for dysuria, frequency, hematuria and urgency.  Musculoskeletal: Negative for falls and joint pain.  Skin: Negative for itching and rash.  Neurological: Negative for dizziness, loss of consciousness, weakness and headaches.  Endo/Heme/Allergies: Does not bruise/bleed easily.  Psychiatric/Behavioral: Negative for depression and memory loss. The patient is not nervous/anxious.     Health Maintenance  Topic Date Due  . TETANUS/TDAP  09/10/2026  . INFLUENZA VACCINE  Completed  . PNA vac Low Risk Adult  Completed    Physical  Exam: Vitals:   11/06/17 1425  BP: 140/80  Pulse: (!) 55  Temp: 98.4 F (36.9 C)  TempSrc: Oral  SpO2: 96%  Weight: 177 lb (80.3 kg)   Body mass index is 27.72 kg/m. Physical Exam  Constitutional: He is oriented to person, place, and time. He appears well-developed and well-nourished. No distress.  Cardiovascular: Normal rate, regular rhythm, normal heart sounds and intact distal pulses.  Pulmonary/Chest: Effort normal and breath sounds normal. No respiratory distress.  Abdominal: Bowel sounds are normal.  Musculoskeletal: Normal range of motion.  Neurological: He is alert and oriented to person, place, and time.  Skin: Skin is warm and dry.  Psychiatric: He has a normal mood and affect.    Labs reviewed: Basic Metabolic Panel: Recent Labs    02/28/17 0806 07/14/17 0124 09/25/17 0916  NA 137 135 137  K 4.5 4.1 5.4*  CL 103 103 105  CO2 19 23 20   GLUCOSE 102* 126* 126*  BUN 16 23* 22  CREATININE 1.19 1.44* 1.47*  CALCIUM 9.9 9.4 10.3   Liver Function Tests: Recent Labs    01/10/17 0950  AST 17  ALT 20  ALKPHOS 91  BILITOT 0.6  PROT 7.1  ALBUMIN 3.9   No results for input(s): LIPASE, AMYLASE in the last 8760 hours. No results for input(s): AMMONIA in the last 8760 hours. CBC: Recent Labs    07/14/17 0124  WBC 7.1  NEUTROABS 4.9  HGB 14.4  HCT 44.6  MCV 95.9  PLT 259   Lipid Panel: Recent Labs    01/10/17 0950  CHOL 169  HDL 46  LDLCALC 77  TRIG 231*  CHOLHDL 3.7   Lab Results  Component Value Date   HGBA1C 5.3 01/10/2017    Assessment/Plan 1. Chronic kidney disease, stage 3 (HCC) -bump in cr occurred with losartan but bp much improved so continue same regimen and monitor this -discussed importance of 8 8oz glasses of water per day and he will do better about this  2. Essential hypertension, benign -cont same regimen, has improved at last  3. Mixed hyperlipidemia -has been educated extensively over the years about diet and exercise  to improve this and #4, cont fenofibrate and pravachol, tolerating well  4. Hyperglycemia -cont diet and exercise  5. Benign prostatic hyperplasia with urinary frequency -improved with vesicare and rapaflo as he is currently taking it (not clear what dose--he says he's taking 2mg , but I think he was taking 8mg  and is now on 4mg )  6. Overweight (BMI 25.0-29.9) -down 3 lbs with exercise, cont to work on this, increase water intake and decrease sodas, fatty foods and starchy foods   Labs/tests ordered:  No orders of the defined types were placed in this encounter.  Next appt:  03/05/2018  Kelicia Youtz L. Ranie Chinchilla, D.O. Geriatrics  [facility] [address], [place] [address] Cell Phone (Mon-Fri 8am-5pm):  [phone] On Call:  [phone] & follow prompts after 5pm & weekends Office Phone:  [phone] Office Fax:  [phone]

## 2017-12-05 ENCOUNTER — Other Ambulatory Visit: Payer: Self-pay | Admitting: *Deleted

## 2017-12-05 MED ORDER — FENOFIBRATE 54 MG PO TABS: ORAL_TABLET | ORAL | 1 refills | Status: DC

## 2017-12-16 ENCOUNTER — Other Ambulatory Visit: Payer: Self-pay | Admitting: *Deleted

## 2017-12-16 DIAGNOSIS — K219 Gastro-esophageal reflux disease without esophagitis: Secondary | ICD-10-CM

## 2017-12-16 MED ORDER — OMEPRAZOLE 20 MG PO CPDR: 20.0000 mg | DELAYED_RELEASE_CAPSULE | Freq: Every day | ORAL | 1 refills | Status: DC

## 2017-12-16 NOTE — Telephone Encounter (Signed)
[facility] 

## 2018-01-19 ENCOUNTER — Other Ambulatory Visit: Payer: Self-pay | Admitting: Internal Medicine

## 2018-01-19 DIAGNOSIS — I10 Essential (primary) hypertension: Secondary | ICD-10-CM

## 2018-03-05 ENCOUNTER — Encounter: Payer: Self-pay | Admitting: Internal Medicine

## 2018-03-05 ENCOUNTER — Ambulatory Visit (INDEPENDENT_AMBULATORY_CARE_PROVIDER_SITE_OTHER): Payer: Medicare Other | Admitting: Internal Medicine

## 2018-03-05 VITALS — BP 156/88 | HR 57 | Temp 97.8°F | Ht 67.0 in | Wt 180.0 lb

## 2018-03-05 DIAGNOSIS — E782 Mixed hyperlipidemia: Secondary | ICD-10-CM | POA: Diagnosis not present

## 2018-03-05 DIAGNOSIS — E663 Overweight: Secondary | ICD-10-CM | POA: Diagnosis not present

## 2018-03-05 DIAGNOSIS — I10 Essential (primary) hypertension: Secondary | ICD-10-CM

## 2018-03-05 DIAGNOSIS — N401 Enlarged prostate with lower urinary tract symptoms: Secondary | ICD-10-CM | POA: Diagnosis not present

## 2018-03-05 DIAGNOSIS — R739 Hyperglycemia, unspecified: Secondary | ICD-10-CM | POA: Diagnosis not present

## 2018-03-05 DIAGNOSIS — Z6828 Body mass index (BMI) 28.0-28.9, adult: Secondary | ICD-10-CM

## 2018-03-05 DIAGNOSIS — R350 Frequency of micturition: Secondary | ICD-10-CM

## 2018-03-05 DIAGNOSIS — N183 Chronic kidney disease, stage 3 unspecified: Secondary | ICD-10-CM

## 2018-03-05 MED ORDER — METOPROLOL SUCCINATE ER 200 MG PO TB24: 200.0000 mg | ORAL_TABLET | Freq: Every day | ORAL | 1 refills | Status: DC

## 2018-03-05 NOTE — Patient Instructions (Addendum)
Restart your metoprolol succinate 200mg  po daily. Continue amlodipine 20mg  po daily and losartan 100mg  po daily. The medication you were told to stop by cardiology was lisinopril which caused you to cough.  Please go get your labs done fasting tomorrow morning and we will call you when we receive your results.    Also schedule a follow-up appointment with Dr. Irish Lack at Cardiology as directed at the last visit there in November.  Fat and Cholesterol Restricted Diet Getting too much fat and cholesterol in your diet may cause health problems. Following this diet helps keep your fat and cholesterol at normal levels. This can keep you from getting sick. What types of fat should I choose?  Choose monosaturated and polyunsaturated fats. These are found in foods such as olive oil, canola oil, flaxseeds, walnuts, almonds, and seeds.  Eat more omega-3 fats. Good choices include salmon, mackerel, sardines, tuna, flaxseed oil, and ground flaxseeds.  Limit saturated fats. These are in animal products such as meats, butter, and cream. They can also be in plant products such as palm oil, palm kernel oil, and coconut oil.  Avoid foods with partially hydrogenated oils in them. These contain trans fats. Examples of foods that have trans fats are stick margarine, some tub margarines, cookies, crackers, and other baked goods. What general guidelines do I need to follow?  Check food labels. Look for the words "trans fat" and "saturated fat."  When preparing a meal: ? Fill half of your plate with vegetables and green salads. ? Fill one fourth of your plate with whole grains. Look for the word "whole" as the first word in the ingredient list. ? Fill one fourth of your plate with lean protein foods.  Eat more foods that have fiber, like apples, carrots, beans, peas, and barley.  Eat more home-cooked foods. Eat less at restaurants and buffets.  Limit or avoid alcohol.  Limit foods high in starch and  sugar.  Limit fried foods.  Cook foods without frying them. Baking, boiling, grilling, and broiling are all great options.  Lose weight if you are overweight. Losing even a small amount of weight can help your overall health. It can also help prevent diseases such as diabetes and heart disease. What foods can I eat? Grains Whole grains, such as whole wheat or whole grain breads, crackers, cereals, and pasta. Unsweetened oatmeal, bulgur, barley, quinoa, or brown rice. Corn or whole wheat flour tortillas. Vegetables Fresh or frozen vegetables (raw, steamed, roasted, or grilled). Green salads. Fruits All fresh, canned (in natural juice), or frozen fruits. Meat and Other Protein Products Ground beef (85% or leaner), grass-fed beef, or beef trimmed of fat. Skinless chicken or [place]. Ground chicken or [place]. Pork trimmed of fat. All fish and seafood. Eggs. Dried beans, peas, or lentils. Unsalted nuts or seeds. Unsalted canned or dry beans. Dairy Low-fat dairy products, such as skim or 1% milk, 2% or reduced-fat cheeses, low-fat ricotta or cottage cheese, or plain low-fat yogurt. Fats and Oils Tub margarines without trans fats. Light or reduced-fat mayonnaise and salad dressings. Avocado. Olive, canola, sesame, or safflower oils. Natural peanut or almond butter (choose ones without added sugar and oil). The items listed above may not be a complete list of recommended foods or beverages. Contact your dietitian for more options. What foods are not recommended? Grains White bread. White pasta. White rice. Cornbread. Bagels, pastries, and croissants. Crackers that contain trans fat. Vegetables White potatoes. Corn. Creamed or fried vegetables. Vegetables in a cheese sauce.  Fruits Dried fruits. Canned fruit in light or heavy syrup. Fruit juice. Meat and Other Protein Products Fatty cuts of meat. Ribs, chicken wings, bacon, sausage, bologna, salami, chitterlings, fatback, hot dogs, bratwurst, and  packaged luncheon meats. Liver and organ meats. Dairy Whole or 2% milk, cream, half-and-half, and cream cheese. Whole milk cheeses. Whole-fat or sweetened yogurt. Full-fat cheeses. Nondairy creamers and whipped toppings. Processed cheese, cheese spreads, or cheese curds. Sweets and Desserts Corn syrup, sugars, honey, and molasses. Candy. Jam and jelly. Syrup. Sweetened cereals. Cookies, pies, cakes, donuts, muffins, and ice cream. Fats and Oils Butter, stick margarine, lard, shortening, ghee, or bacon fat. Coconut, palm kernel, or palm oils. Beverages Alcohol. Sweetened drinks (such as sodas, lemonade, and fruit drinks or punches). The items listed above may not be a complete list of foods and beverages to avoid. Contact your dietitian for more information. This information is not intended to replace advice given to you by your health care provider. Make sure you discuss any questions you have with your health care provider. Document Released: 03/17/2012 Document Revised: 05/23/2016 Document Reviewed: 12/16/2013 Elsevier Interactive Patient Education  [employer].

## 2018-03-05 NOTE — Progress Notes (Signed)
Location:  [facility] clinic Provider:  Ciria Bernardini L. Mariea Clonts, D.O., C.M.D.  Code Status: full code Goals of Care:  Advanced Directives 03/05/2018  Does Patient Have a Medical Advance Directive? No  Type of Advance Directive -  Does patient want to make changes to medical advance directive? -  Copy of [place] in Chart? -  Would patient like information on creating a medical advance directive? No - Patient declined     Chief Complaint  Patient presents with  . Medical Management of Chronic Issues    81mth follow-up    HPI: Patient is a 77 y.o. male seen today for medical management of chronic diseases.    He is not taking metoprolol succinate 200mg  daily.    He's no longer using the nasonex.    He's not taking the prilosec anymore b/c he's not having acid reflux now.    BP is 131-140 when he goes up to the pharmacy.  He came in here running today, had been sleeping in front of the tv and woke up suddenly needing to come for his appt.  Wt up three lbs.  He's been exercising a little bit, but not much.  He says he may have eaten too much.  Had tuna sub today.    No difficulty with urinating lately.  Urinates on time.  Does slow down sometimes.  He does drink a lot of liquid, does drink some beer, water, and occasionally pepsi cola.  Tea also makes him pee.  If he only drinks water, it levels out ok.  Does not have frequent nocturia.  Only gets up 3x if he drinks a lot of fluids.    Past Medical History:  Diagnosis Date  . Allergic rhinitis    seasonal  . BPH (benign prostatic hyperplasia)   . Carotid bruit    Carotid US 5/18:  1-39% RICA stenosis; Prominent R subclavian artery, measuring 1.4 cm AP x 1.5 cm TRV. - FU prn  . Cervicalgia 02/28/2016  . Chronic kidney disease   . Hypercholesteremia   . Hypertension     Past Surgical History:  Procedure Laterality Date  . COLONOSCOPY  02/2009   Sessile polyp in the rectum. Otherwise normal examination  . EYE SURGERY   2013   Right eye cataract removed--Dr. Katy Fitch    Allergies  Allergen Reactions  . Lisinopril Cough    Outpatient Encounter Medications as of 03/05/2018  Medication Sig  . amLODipine (NORVASC) 10 MG tablet Take 2 tablets (20 mg total) by mouth daily.  Marland Kitchen aspirin 81 MG chewable tablet Chew 81 mg by mouth daily.  . fenofibrate 54 MG tablet Take one tablet by mouth once daily  . losartan (COZAAR) 100 MG tablet Take 1 tablet (100 mg total) by mouth daily.  Marland Kitchen omeprazole (PRILOSEC) 20 MG capsule Take 1 capsule (20 mg total) by mouth daily before supper.  . pravastatin (PRAVACHOL) 40 MG tablet Take one tablet by mouth once daily for cholesterol  . silodosin (RAPAFLO) 4 MG CAPS capsule Take 1 capsule (4 mg total) by mouth daily with breakfast.  . solifenacin (VESICARE) 5 MG tablet Take 5 mg by mouth daily.  . [DISCONTINUED] metoprolol (TOPROL-XL) 200 MG 24 hr tablet Take 1 tablet (200 mg total) by mouth daily.  . [DISCONTINUED] mometasone (NASONEX) 50 MCG/ACT nasal spray 2 sprays by Each Nare route nightly.   No facility-administered encounter medications on file as of 03/05/2018.     Review of Systems:  Review of  Systems  Constitutional: Negative for chills, fever and malaise/fatigue.       Wt gain  HENT: Negative for congestion.   Eyes: Negative for blurred vision.       Glasses  Respiratory: Negative for cough and shortness of breath.   Cardiovascular: Negative for chest pain, palpitations and leg swelling.  Gastrointestinal: Negative for abdominal pain, blood in stool, constipation, heartburn, nausea and vomiting.  Genitourinary: Positive for frequency. Negative for dysuria, flank pain, hematuria and urgency.  Musculoskeletal: Negative for falls.  Skin: Negative for itching and rash.  Neurological: Negative for dizziness and loss of consciousness.  Endo/Heme/Allergies: Does not bruise/bleed easily.  Psychiatric/Behavioral: Negative for depression and memory loss. The patient does not  have insomnia.     Health Maintenance  Topic Date Due  . INFLUENZA VACCINE  04/30/2018  . TETANUS/TDAP  09/10/2026  . PNA vac Low Risk Adult  Completed    Physical Exam: Vitals:   03/05/18 1507  BP: (!) 150/80  Pulse: (!) 57  Temp: 97.8 F (36.6 C)  TempSrc: Oral  SpO2: 97%  Weight: 180 lb (81.6 kg)  Height: 5\' 7"  (1.702 m)   Body mass index is 28.19 kg/m. Physical Exam  Constitutional: He is oriented to person, place, and time. He appears well-developed and well-nourished. No distress.  HENT:  Head: Normocephalic and atraumatic.  Cardiovascular: Normal rate, regular rhythm, normal heart sounds and intact distal pulses.  Pulmonary/Chest: Effort normal and breath sounds normal. No respiratory distress. He has no wheezes.  Abdominal: Soft. Bowel sounds are normal. He exhibits no distension. There is no tenderness.  Musculoskeletal: Normal range of motion. He exhibits no tenderness.  Neurological: He is alert and oriented to person, place, and time.  Skin: Skin is warm and dry.  Psychiatric:  Anxious at appts    Labs reviewed: Basic Metabolic Panel: Recent Labs    07/14/17 0124 09/25/17 0916  NA 135 137  K 4.1 5.4*  CL 103 105  CO2 23 20  GLUCOSE 126* 126*  BUN 23* 22  CREATININE 1.44* 1.47*  CALCIUM 9.4 10.3   Liver Function Tests: No results for input(s): AST, ALT, ALKPHOS, BILITOT, PROT, ALBUMIN in the last 8760 hours. No results for input(s): LIPASE, AMYLASE in the last 8760 hours. No results for input(s): AMMONIA in the last 8760 hours. CBC: Recent Labs    07/14/17 0124  WBC 7.1  NEUTROABS 4.9  HGB 14.4  HCT 44.6  MCV 95.9  PLT 259   Lipid Panel: No results for input(s): CHOL, HDL, LDLCALC, TRIG, CHOLHDL, LDLDIRECT in the last 8760 hours. Lab Results  Component Value Date   HGBA1C 5.3 01/10/2017   Assessment/Plan 1. Essential hypertension, benign -bp elevated at the office, but pt thinks he was supposed to stop metoprolol succinate 200mg ,  but we cannot find any record of this in epic -we have called cardiology and they report he was not to stop the metoprolol succinate 200mg  daily and should restart--explained in detail and Rx faxed to his pharmacy -also advised to f/u with cardiology and gave him the phone number  2. Chronic kidney disease, stage 3 (HCC) -has been stable, need f/u labs Avoid nephrotoxic agents like nsaids, dose adjust renally excreted meds, hydrate.  3. Mixed hyperlipidemia -cont fenofibrate and pravachol -work on diet and exercise regimen  4. Hyperglycemia -ongoing, f/u hba1c and work on diet and exercise  5. Overweight (BMI 25.0-29.9) -counseled again on this, need for weight loss  6. Benign prostatic hyperplasia with urinary  frequency - cont rapaflo and vesicare  7. Body mass index 28.0-28.9, adult -cont to work on diet and exercise, low fat and cholesterol diet info provided--also should reduce beer and soda intake  Labs/tests ordered:  To get fasting labs:  cbc, bmp, flp, hba1c done at [facility] in the am (was to do before visit but lost prescription and does not want labs done here in our office) Next appt:   4 mos med mgt  Naraly Fritcher L. Tommey Barret, D.O. [facility] [address], [place] [address] Cell Phone (Mon-Fri 8am-5pm):  [phone] On Call:  [phone] & follow prompts after 5pm & weekends Office Phone:  [phone] Office Fax:  [phone]

## 2018-03-06 DIAGNOSIS — E782 Mixed hyperlipidemia: Secondary | ICD-10-CM | POA: Diagnosis not present

## 2018-03-06 DIAGNOSIS — N183 Chronic kidney disease, stage 3 (moderate): Secondary | ICD-10-CM | POA: Diagnosis not present

## 2018-03-06 DIAGNOSIS — I10 Essential (primary) hypertension: Secondary | ICD-10-CM | POA: Diagnosis not present

## 2018-03-06 DIAGNOSIS — R739 Hyperglycemia, unspecified: Secondary | ICD-10-CM | POA: Diagnosis not present

## 2018-03-06 LAB — LIPID PANEL
Cholesterol: 162 (ref 0–200)
HDL: 53 (ref 35–70)
LDL CALC: 85
TRIGLYCERIDES: 143 (ref 40–160)

## 2018-03-06 LAB — CBC AND DIFFERENTIAL
HEMATOCRIT: 42 (ref 41–53)
HEMOGLOBIN: 14.7 (ref 13.5–17.5)
Platelets: 317 (ref 150–399)
WBC: 6.4

## 2018-03-06 LAB — BASIC METABOLIC PANEL
BUN: 16 (ref 4–21)
Creatinine: 1.4 — AB (ref 0.6–1.3)
Glucose: 101
POTASSIUM: 4.1 (ref 3.4–5.3)
Sodium: 136 — AB (ref 137–147)

## 2018-03-06 LAB — HEMOGLOBIN A1C: Hemoglobin A1C: 5.3

## 2018-03-11 ENCOUNTER — Encounter: Payer: Self-pay | Admitting: *Deleted

## 2018-03-13 ENCOUNTER — Telehealth: Payer: Self-pay | Admitting: *Deleted

## 2018-03-13 NOTE — Telephone Encounter (Signed)
Calling patient with lab results:  "LDL above goal of greater than 70, please call him and have him increase his Pravachol to 80 mg daily, update med list Kidneys stable since 10/18"  Results sent to scan

## 2018-03-30 NOTE — Telephone Encounter (Signed)
.  left message to have patient return my call.  

## 2018-04-13 NOTE — Telephone Encounter (Signed)
I guess we need to send him a letter.

## 2018-04-13 NOTE — Telephone Encounter (Signed)
.  left message to have patient return my call.  I have been trying to reach patient for a while now, please advise

## 2018-04-14 ENCOUNTER — Encounter: Payer: Self-pay | Admitting: Cardiology

## 2018-04-14 ENCOUNTER — Ambulatory Visit (INDEPENDENT_AMBULATORY_CARE_PROVIDER_SITE_OTHER): Payer: Medicare Other | Admitting: Cardiology

## 2018-04-14 VITALS — BP 130/76 | HR 59 | Ht 67.0 in | Wt 182.8 lb

## 2018-04-14 DIAGNOSIS — N183 Chronic kidney disease, stage 3 unspecified: Secondary | ICD-10-CM

## 2018-04-14 DIAGNOSIS — E782 Mixed hyperlipidemia: Secondary | ICD-10-CM | POA: Diagnosis not present

## 2018-04-14 DIAGNOSIS — I10 Essential (primary) hypertension: Secondary | ICD-10-CM

## 2018-04-14 MED ORDER — AMLODIPINE BESYLATE 10 MG PO TABS: 10.0000 mg | ORAL_TABLET | Freq: Every day | ORAL | 0 refills | Status: DC

## 2018-04-14 MED ORDER — HYDRALAZINE HCL 10 MG PO TABS: 10.0000 mg | ORAL_TABLET | Freq: Three times a day (TID) | ORAL | 11 refills | Status: DC

## 2018-04-14 NOTE — Patient Instructions (Addendum)
Your physician has recommended you make the following change in your medication:   DECREASE AMLODIPINE TO 10 MG EVERY DAY   ADD HYDRALAZINE 10 MG  THREE TIMES A  DAY  Your physician recommends that you schedule a follow-up appointment in:  3 WEEKS IN [facility] CLINIC  [place] DR Irish Lack

## 2018-04-14 NOTE — Progress Notes (Signed)
04/14/2018 Douglas Lowery   08-30-1941  [id number]  Primary Physician Gayland Curry, DO Primary Cardiologist: Dr. Irish Lack   Reason for Visit/CC: F/u for HTN  HPI:  Douglas Lowery is a 77 y.o. male who is being seen today for the evaluation of hypertension. Additional medical history includes hyperlipidemia, chronic kidney disease and BPH.  He has been followed by Dr. Irish Lack, primarily due to uncontrolled BP.  Had renal artery Dopplers performed in February 2017 which showed no significant renal artery stenosis.  He also had bilateral carotid artery Dopplers in May 2018 which showed 1 to 39% right internal carotid artery stenosis.  He has been on multiple medications for hypertension.  He is intolerant to ACE inhibitors due to cough and was changed to an ARB.  PCP follows his lipids and manages his medications for this.  He had a recent fasting lipid panel in June which showed an LDL of 85 mg/dL.  Also had a basic metabolic panel.  His baseline creatinine is 1.4.  He presents to clinic today for management of hypertension.  He was recently seen by his PCP and his blood pressure was elevated.  He was also noted to be confused about his medications.  Unsure if he was previously ordered to stop metoprolol.  Pt advised to follow-up with cardiology for further management.  I have reviewed previous telephone encounters and it appears that the patient was not told by cardiology to stop metoprolol.  In January 2019 he called complaining of a chronic cough with lisinopril.  He was given instructions by Dr. Irish Lack at that time to stop lisinopril and stop losartan and to return back for follow-up Bmet.  This is the only medication change recently recommended by cardiology.  Current med list reviewed.  Listed antihypertensive medications on today's list include metoprolol succinate, 200 mg daily, amlodipine 20 mg (10 mg twice daily) and losartan 100 mg daily. He reports full med compliance. He notes  today that he has been taking metoprolol since his PCP visit and has had no side effects. BP is 130/76. EKG shows sinus bradycardia, 52 bpm w/ 1st degree AVB. He denies any cardiac symptoms. No CP or dyspnea. No exertional symptoms. He is also asymptomatic with bradycardia. No fatigue, dizziness, syncope/ near syncope.   CV Studies Carotid US 5/18:  1-39% RICA stenosis; Prominent R subclavian artery, measuring 1.4 cm AP x 1.5 cm . - FU prn  Renal Art [place] 2/17 No significant RA stenosis    Allergies  Allergen Reactions  . Lisinopril Cough   Past Medical History:  Diagnosis Date  . Allergic rhinitis    seasonal  . BPH (benign prostatic hyperplasia)   . Carotid bruit    Carotid US 5/18:  1-39% RICA stenosis; Prominent R subclavian artery, measuring 1.4 cm AP x 1.5 cm TRV. - FU prn  . Cervicalgia 02/28/2016  . Chronic kidney disease   . Hypercholesteremia   . Hypertension    Family History  Problem Relation Age of Onset  . Pneumonia Father   . Alzheimer's disease Sister   . Hypertension Sister   . Hypertension Sister   . Hypertension Sister   . Heart attack Neg Hx    Past Surgical History:  Procedure Laterality Date  . COLONOSCOPY  02/2009   Sessile polyp in the rectum. Otherwise normal examination  . EYE SURGERY  2013   Right eye cataract removed--Dr. Katy Fitch   Social History   Socioeconomic History  . Marital status:  Single    Spouse name: Not on file  . Number of children: Not on file  . Years of education: Not on file  . Highest education level: Not on file  Occupational History  . Occupation: Retired  Scientific laboratory technician  . Financial resource strain: Not hard at all  . Food insecurity:    Worry: Never true    Inability: Never true  . Transportation needs:    Medical: No    Non-medical: No  Tobacco Use  . Smoking status: Never Smoker  . Smokeless tobacco: Never Used  Substance and Sexual Activity  . Alcohol use: Yes    Comment: 1 can beer every week  . Drug  use: No  . Sexual activity: Yes  Lifestyle  . Physical activity:    Days per week: 3 days    Minutes per session: 10 min  . Stress: Only a little  Relationships  . Social connections:    Talks on phone: More than three times a week    Gets together: More than three times a week    Attends religious service: Never    Active member of club or organization: No    Attends meetings of clubs or organizations: Never    Relationship status: Never married  . Intimate partner violence:    Fear of current or ex partner: No    Emotionally abused: No    Physically abused: No    Forced sexual activity: No  Other Topics Concern  . 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   Single   4 children; 6 grandchildren     Review of Systems: General: negative for chills, fever, night sweats or weight changes.  Cardiovascular: negative for chest pain, dyspnea on exertion, edema, orthopnea, palpitations, paroxysmal nocturnal dyspnea or shortness of breath Dermatological: negative for rash Respiratory: negative for cough or wheezing Urologic: negative for hematuria Abdominal: negative for nausea, vomiting, diarrhea, bright red blood per rectum, melena, or hematemesis Neurologic: negative for visual changes, syncope, or dizziness All other systems reviewed and are otherwise negative except as noted above.   Physical Exam:  There were no vitals taken for this visit.  General appearance: alert, cooperative and no distress Neck: no carotid bruit and no JVD Lungs: clear to auscultation bilaterally Heart: regular rate and rhythm, S1, S2 normal, no murmur, click, rub or gallop Extremities: extremities normal, atraumatic, no cyanosis or edema Pulses: 2+ and symmetric Skin: Skin color, texture, turgor normal. No rashes or lesions Neurologic: Grossly normal  EKG sinus bradycardia, 52 bpm s/ 1st degree AVB. -- personally reviewed   ASSESSMENT AND  PLAN:   1. HTN: BP is controlled today at 130/76 and patient reports compliance with metoprolol, losartan and amlodipine.  However it was discovered today that the patient is taking more than the recommended daily dose of amlodipine.  He has been taking 20 mg of amlodipine daily.  We will reduce the dose of amlodipine down to 10 mg daily.  We will continue losartan 100 mg and metoprolol 200 mg daily.  To offset the reduction of amlodipine, we will need to add a another antihypertensive agent to keep BP controlled.  Unfortunately due to bradycardia with a resting heart rate of 52 bpm, we are unable to further titrate metoprolol and he is already on the max dose of losartan.  Given his chronic kidney disease we will refrain from adding a thiazide diuretic.  I  recommended that he start hydralazine 10 mg 3 times daily.  We will have him follow-up in the hypertension clinic in 3 weeks for repeat blood pressure assessment.  2. HLD: lipids/medication followed/managed by PCP. Recent FLP showed LDL of 85 mg/dL. He has no known coronary disease. Will continue current regimen. PCP will continue to follow.    3. CKD: followed by PCP. Baseline SCr ~1.4.  4. BPH: management per PCP.    Follow-Up in HTN clinic in 3 weeks and w/ Dr. Irish Lack in 6 months.   Brittainy Ladoris Gene, MHS [facility] 04/14/2018 8:07 AM

## 2018-04-16 NOTE — Telephone Encounter (Signed)
Mailed copy of labs to patient and asked patient to call the office

## 2018-04-20 ENCOUNTER — Telehealth: Payer: Self-pay | Admitting: *Deleted

## 2018-04-20 NOTE — Telephone Encounter (Signed)
Patient walked into office with a letter he received regarding his labwork. Patient stated that the phone number on file was correct but didn't understand why his phone never rung with him having it on his side at all times.  Stated that he had seen the Cardiologist earlier and some medications were changed and he wanted to make sure those were correct in his chart. Medication reviewed and chart updated.

## 2018-05-05 ENCOUNTER — Encounter (INDEPENDENT_AMBULATORY_CARE_PROVIDER_SITE_OTHER): Payer: Self-pay

## 2018-05-05 ENCOUNTER — Ambulatory Visit (INDEPENDENT_AMBULATORY_CARE_PROVIDER_SITE_OTHER): Payer: Medicare Other | Admitting: Pharmacist

## 2018-05-05 VITALS — BP 172/84 | HR 63

## 2018-05-05 DIAGNOSIS — I10 Essential (primary) hypertension: Secondary | ICD-10-CM

## 2018-05-05 MED ORDER — HYDRALAZINE HCL 25 MG PO TABS: 25.0000 mg | ORAL_TABLET | Freq: Three times a day (TID) | ORAL | 11 refills | Status: DC

## 2018-05-05 MED ORDER — PRAVASTATIN SODIUM 80 MG PO TABS: 80.0000 mg | ORAL_TABLET | Freq: Every day | ORAL | 11 refills | Status: AC

## 2018-05-05 NOTE — Progress Notes (Signed)
Patient ID: [name]                 DOB: Nov 02, 1940                      MRN: [id number]     HPI: Douglas Lowery is a 77 y.o. male patient of Douglas Lowery referred by Douglas Jester, PA to HTN clinic. PMH is significant for HTN, HLD, CKD, and BPH. Renal artery dopplers in 2017 revealed no significant renal artery stenosis. Bilateral carotid artery dopplers in 2018 showed 1-39% right ICA stenosis. He was seen 3 weeks ago and was taking 20mg  of amlodipine at that time. His dose was decreased to 10mg  daily and he was started on hydralazine 10mg  TID. He had also stopped taking his metoprolol due to confusion over medications and has since restarted. He presents today for follow up.  Pt presents today in good spirits. He reports tolerating his medications well. Does not sound like he is taking 3 doses of hydralazine each day and is spacing them apart closer to 4 hours rather than 8 when he does take multiple doses. Most days seems like he is taking 1 dose of hydralazine. Reports some fatigue however is taking all BP medications at the same time in the morning. He does not have a BP cuff but checks his readings [facility] at [facility]. Has not checked BP since most recent medication changes but recalls prior systolic reading of 016 and a low of 133. He took his medications 30-45 minutes ago and has had some caffeinated Pepsi this morning. Pt eats a high sodium diet - most meals out at restaurants. He does stay active walking throughout the day.  Current HTN meds: amlodipine 10mg  daily, hydralazine 10mg  TID, losartan 100mg  daily, Toprol 200mg  daily Previously tried: lisinopril - cough BP goal: <130/15mmHg  Family History: Sister with HTN  Social History: Occasional alcohol use, denies tobacco and illicit drug use.  Diet: Likes fruit. 5 days a week - fast food burger for lunch. Diner for dinner - collard greens, mashed potatoes with gravy, chicken or steak. Cut back on red meat. Occasional Pepsi, no  other caffeine.  Exercise: Walking close to 6 miles each day, stays on his feet.  Home BP readings: Does not check, occasionally goes to [facility] and recalls systolic reading of 010, low of 133  Wt Readings from Last 3 Encounters:  04/14/18 182 lb 12.8 oz (82.9 kg)  03/05/18 180 lb (81.6 kg)  11/06/17 177 lb (80.3 kg)   BP Readings from Last 3 Encounters:  04/14/18 130/76  03/05/18 (!) 156/88  11/06/17 140/80   Pulse Readings from Last 3 Encounters:  04/14/18 (!) 59  03/05/18 (!) 57  11/06/17 (!) 55    Renal function: CrCl cannot be calculated (Patient's most recent lab result is older than the maximum 21 days allowed.).  Past Medical History:  Diagnosis Date  . Allergic rhinitis    seasonal  . BPH (benign prostatic hyperplasia)   . Carotid bruit    Carotid US 5/18:  1-39% RICA stenosis; Prominent R subclavian artery, measuring 1.4 cm AP x 1.5 cm TRV. - FU prn  . Cervicalgia 02/28/2016  . Chronic kidney disease   . Hypercholesteremia   . Hypertension     Current Outpatient Medications on File Prior to Visit  Medication Sig Dispense Refill  . amLODipine (NORVASC) 10 MG tablet Take 1 tablet (10 mg total) by mouth daily. 180 tablet 0  .  aspirin 81 MG chewable tablet Chew 81 mg by mouth daily.    . fenofibrate 54 MG tablet Take one tablet by mouth once daily 90 tablet 1  . hydrALAZINE (APRESOLINE) 10 MG tablet Take 1 tablet (10 mg total) by mouth 3 (three) times daily. 90 tablet 11  . losartan (COZAAR) 100 MG tablet Take 1 tablet (100 mg total) by mouth daily. 90 tablet 3  . metoprolol (TOPROL-XL) 200 MG 24 hr tablet Take 1 tablet (200 mg total) by mouth daily. 90 tablet 1  . omeprazole (PRILOSEC) 20 MG capsule Take 1 capsule (20 mg total) by mouth daily before supper. 90 capsule 1  . pravastatin (PRAVACHOL) 40 MG tablet Take one tablet by mouth once daily for cholesterol (Patient taking differently: Take two tablets by mouth once daily for cholesterol) 90 tablet 1  . silodosin  (RAPAFLO) 4 MG CAPS capsule Take 1 capsule (4 mg total) by mouth daily with breakfast. 30 capsule 3  . solifenacin (VESICARE) 5 MG tablet Take 5 mg by mouth daily.     No current facility-administered medications on file prior to visit.     Allergies  Allergen Reactions  . Lisinopril Cough     Assessment/Plan:  1. Hypertension - BP above goal <130/61mmHg due to dietary indiscretion and medication compliance issues. Unclear how patient has been taking hydralazine however sounds like he has only been taking it daily most days. Also eats a high sodium diet with all meals at restaurants or fast food. Will increase hydralazine to 25mg  TID. Pt will take 3 tablets of his 10mg  dose TID until he finishes up current supply. Advised pt to separate doses and take at 6am, 2pm, and 10pm for better efficacy. Will move Toprol 200mg  to evening to see if this improves fatigue. Will continue amlodipine 10mg  daily and losartan 100mg  daily. Encouraged pt to decrease sodium in diet. F/u in HTN clinic in 3-4 weeks.   Douglas Lowery, PharmD, BCACP, [facility] [address] [place], [place] [address] Phone: [phone]; Fax: [phone] 05/05/2018 9:57 AM

## 2018-05-05 NOTE — Patient Instructions (Addendum)
Move your metoprolol to night time  Take your hydralazine 3 times a day: 6am, 2pm, and 10pm.  To use up your hydralazine, start taking 3 tablets (30mg ) 3x a day  When you run out, pick up a new prescription for the 25mg  tablets and take 1 tablet 3x a day  Continue taking your other medications  [facility] - your goal is < 130/80  Decrease the salt in your diet and try to eat more foods at home rather than at fast food restaurants  Follow up in 3-4 weeks for a blood pressure check

## 2018-05-10 ENCOUNTER — Other Ambulatory Visit: Payer: Self-pay | Admitting: Internal Medicine

## 2018-05-10 DIAGNOSIS — I10 Essential (primary) hypertension: Secondary | ICD-10-CM

## 2018-05-11 ENCOUNTER — Other Ambulatory Visit: Payer: Self-pay | Admitting: Internal Medicine

## 2018-05-11 DIAGNOSIS — I10 Essential (primary) hypertension: Secondary | ICD-10-CM

## 2018-05-27 NOTE — Progress Notes (Signed)
Patient ID: Douglas Lowery                 DOB: [birth year]                      MRN: [id number]     HPI: Douglas Lowery is a 77 y.o. male patient of Dr Irish Lack referred by Lyda Jester, PA to HTN clinic. PMH is significant for HTN, HLD, CKD, and BPH. Renal artery dopplers in 2017 revealed no significant renal artery stenosis. Bilateral carotid artery dopplers in 2018 showed 1-39% right ICA stenosis. He was last seen in HTN clinic on 05/05/18 with a BP of 172/84. At that visit, his metoprolol was moved to bedtime and his hydralazine was increased to 25 mg TID. There were questions about his adherence to hydralazine at the last visit as he seemed to be taking it once daily most days  Patient presents in good spirits to clinic for HTN management. He states adherence to all his BP medications and endorses taking his hydralazine TID. He is tolerating the hydralazine at the higher dose well. He also states that he has more energy after switching his metoprolol to the evening. He denies SOB, dizziness, and chest pain. He does not own a BP cuff at home, but [facility]. He did voice some concern about how many medications he's taking and why it wasn't controlling his BP.   Current HTN meds: amlodipine 10mg  daily (AM), hydralazine 25mg  TID, losartan 100mg  daily (AM), Toprol 200mg  daily (PM) Previously tried: lisinopril - cough BP goal: <130/13mmHg  Family History: Sister with HTN  Social History: Occasional alcohol use, denies tobacco and illicit drug use.  Diet: He is not eating as much. He states he is not doing fast foods during lunch on weekdays. However for lunch, [employer] where he lives (brocolli, peaches, casserole, baked chicken, ham, tuna/chicken salad). Vegetables fill about 1/4 plate. Has milk every day and OJ every 3 days. Does not add salt to food. Occasional Pepsi, no other caffeine.  Exercise: Walking about 10-12 miles each day, stays on his feet.  Home  BP readings: Does not check, [facility] and recalls readings of 130s/80s.  Wt Readings from Last 3 Encounters:  04/14/18 182 lb 12.8 oz (82.9 kg)  03/05/18 180 lb (81.6 kg)  11/06/17 177 lb (80.3 kg)   BP Readings from Last 3 Encounters:  05/28/18 (!) 150/88  05/05/18 (!) 172/84  04/14/18 130/76   Pulse Readings from Last 3 Encounters:  05/28/18 (!) 58  05/05/18 63  04/14/18 (!) 59    Renal function: CrCl cannot be calculated (Patient's most recent lab result is older than the maximum 21 days allowed.).  Past Medical History:  Diagnosis Date  . Allergic rhinitis    seasonal  . BPH (benign prostatic hyperplasia)   . Carotid bruit    Carotid US 5/18:  1-39% RICA stenosis; Prominent R subclavian artery, measuring 1.4 cm AP x 1.5 cm TRV. - FU prn  . Cervicalgia 02/28/2016  . Chronic kidney disease   . Hypercholesteremia   . Hypertension     Current Outpatient Medications on File Prior to Visit  Medication Sig Dispense Refill  . amLODipine (NORVASC) 10 MG tablet TAKE 2 TABLETS BY MOUTH EVERY DAY 180 tablet 1  . aspirin 81 MG chewable tablet Chew 81 mg by mouth daily.    . fenofibrate 54 MG tablet Take one tablet by  mouth once daily 90 tablet 1  . losartan (COZAAR) 100 MG tablet Take 1 tablet (100 mg total) by mouth daily. 90 tablet 3  . metoprolol (TOPROL-XL) 200 MG 24 hr tablet Take 1 tablet (200 mg total) by mouth daily. 90 tablet 1  . omeprazole (PRILOSEC) 20 MG capsule Take 1 capsule (20 mg total) by mouth daily before supper. 90 capsule 1  . pravastatin (PRAVACHOL) 80 MG tablet Take 1 tablet (80 mg total) by mouth daily. 30 tablet 11  . silodosin (RAPAFLO) 4 MG CAPS capsule Take 1 capsule (4 mg total) by mouth daily with breakfast. 30 capsule 3  . solifenacin (VESICARE) 5 MG tablet Take 5 mg by mouth daily.     No current facility-administered medications on file prior to visit.     Allergies  Allergen Reactions  . Lisinopril Cough      Assessment/Plan:  1. Hypertension - BP in clinic above goal (<130/80). Discussed thiazide diuretics as well as increasing hydralazine strength with patient. As patient voiced that he is concerned with the number of medications he takes, his hydralazine was increased to 50 mg TID. Discussed HTN goals and guidelines with patient and why the goal is <130/80. Also discussed risks of long-term high BP. Patient was agreeable to starting a new medication (discussed thiazides) if all his current medications are titrated to the maximum dose. F/u in HTN on 06/30/18 for BP management and medication optimization.  Thank you, Lelan Pons. Patterson Hammersmith, [facility]  05/27/2018 2:39 PM  Patient seen with Danella Penton, PharmD Candidate

## 2018-05-28 ENCOUNTER — Ambulatory Visit (INDEPENDENT_AMBULATORY_CARE_PROVIDER_SITE_OTHER): Payer: Medicare Other | Admitting: Pharmacist

## 2018-05-28 VITALS — BP 150/88 | HR 58

## 2018-05-28 DIAGNOSIS — I10 Essential (primary) hypertension: Secondary | ICD-10-CM | POA: Diagnosis not present

## 2018-05-28 MED ORDER — HYDRALAZINE HCL 50 MG PO TABS: 50.0000 mg | ORAL_TABLET | Freq: Three times a day (TID) | ORAL | 11 refills | Status: DC

## 2018-05-28 NOTE — Patient Instructions (Addendum)
Return for a a follow up appointment on 06/30/18 at 10:30 am  Your blood pressure today is 150/88. Your goal blood pressure is <130/80.  Check your blood pressure at home daily (if able) and keep record of the readings.  Take your blood pressure meds as follows: START taking 2 tablets of your hydralazine 25 mg three times a day. Once you run out of the hydralazine 25 mg, you can pick up the hydralazine 50 mg tablets and take 1 tablet three times a day. Continue taking amlodipine, losartan, and metoprolol as prescribed.  Exercise as you're able, try to walk approximately 30 minutes per day.  Keep salt intake to a minimum, especially watch canned and prepared boxed foods.  Eat more fresh fruits and vegetables and fewer canned items.  Avoid eating in fast food restaurants.

## 2018-06-30 ENCOUNTER — Ambulatory Visit: Payer: Medicare Other

## 2018-07-13 ENCOUNTER — Encounter: Payer: Self-pay | Admitting: Internal Medicine

## 2018-07-13 ENCOUNTER — Ambulatory Visit (INDEPENDENT_AMBULATORY_CARE_PROVIDER_SITE_OTHER): Payer: Medicare Other | Admitting: Internal Medicine

## 2018-07-13 VITALS — BP 200/100 | HR 65 | Temp 97.3°F | Ht 67.0 in | Wt 181.0 lb

## 2018-07-13 DIAGNOSIS — Z6828 Body mass index (BMI) 28.0-28.9, adult: Secondary | ICD-10-CM | POA: Diagnosis not present

## 2018-07-13 DIAGNOSIS — E782 Mixed hyperlipidemia: Secondary | ICD-10-CM

## 2018-07-13 DIAGNOSIS — I10 Essential (primary) hypertension: Secondary | ICD-10-CM | POA: Diagnosis not present

## 2018-07-13 DIAGNOSIS — E663 Overweight: Secondary | ICD-10-CM

## 2018-07-13 DIAGNOSIS — Z23 Encounter for immunization: Secondary | ICD-10-CM

## 2018-07-13 DIAGNOSIS — N183 Chronic kidney disease, stage 3 unspecified: Secondary | ICD-10-CM

## 2018-07-13 DIAGNOSIS — R739 Hyperglycemia, unspecified: Secondary | ICD-10-CM

## 2018-07-13 MED ORDER — CLONIDINE HCL 0.1 MG PO TABS
0.1000 mg | ORAL_TABLET | Freq: Once | ORAL | Status: AC
  Administered 2018-07-13: 0.1 mg via ORAL

## 2018-07-13 NOTE — Progress Notes (Signed)
Location:  [facility] clinic Provider:  Alexina Niccoli L. Mariea Clonts, D.O., C.M.D.  Goals of Care:  Advanced Directives 03/05/2018  Does Patient Have a Medical Advance Directive? No  Type of Advance Directive -  Does patient want to make changes to medical advance directive? -  Copy of [place] in Chart? -  Would patient like information on creating a medical advance directive? No - Patient declined   Chief Complaint  Patient presents with  . Medical Management of Chronic Issues    22monthfollow-up    HPI: Patient is a 77y.o. male seen today for medical management of chronic diseases.    The 2056mtoprol is "knocking me out" in the morning.  He goes to sleep.  Sometimes, he gets a little bit of a headache, can take "that pill"--his regular pills, it's better.  Wakes up with a headache.  BP at pharmacy is 130s after walking over there.  He says he is taking his pills regularly as directed--has some first thing and some after lunch and before "the young and the restless".  When he took all of his meds together, then he'd get dizzy and go to sleep.  He drank a 40 last night, but only does that when he's watching the game.    On max dose pravachol and LDL is 85.    BP recheck 220/114 20 mins later.  No symptoms.      Does not like to get labs here.  Past Medical History:  Diagnosis Date  . Allergic rhinitis    seasonal  . BPH (benign prostatic hyperplasia)   . Carotid bruit    Carotid [place]/18:  1-39% RICA stenosis; Prominent R subclavian artery, measuring 1.4 cm AP x 1.5 cm TRV. - FU prn  . Cervicalgia 02/28/2016  . Chronic kidney disease   . Hypercholesteremia   . Hypertension     Past Surgical History:  Procedure Laterality Date  . COLONOSCOPY  02/2009   Sessile polyp in the rectum. Otherwise normal examination  . EYE SURGERY  2013   Right eye cataract removed--Dr. GrKaty Fitch  Allergies  Allergen Reactions  . Lisinopril Cough    Outpatient Encounter Medications as of  07/13/2018  Medication Sig  . amLODipine (NORVASC) 10 MG tablet TAKE 2 TABLETS BY MOUTH EVERY DAY  . aspirin 81 MG chewable tablet Chew 81 mg by mouth daily.  . fenofibrate 54 MG tablet Take one tablet by mouth once daily  . hydrALAZINE (APRESOLINE) 50 MG tablet Take 1 tablet (50 mg total) by mouth 3 (three) times daily.  . Marland Kitchenosartan (COZAAR) 100 MG tablet Take 1 tablet (100 mg total) by mouth daily.  . metoprolol (TOPROL-XL) 200 MG 24 hr tablet Take 1 tablet (200 mg total) by mouth daily.  . Marland Kitchenmeprazole (PRILOSEC) 20 MG capsule Take 1 capsule (20 mg total) by mouth daily before supper.  . pravastatin (PRAVACHOL) 80 MG tablet Take 1 tablet (80 mg total) by mouth daily.  . silodosin (RAPAFLO) 4 MG CAPS capsule Take 1 capsule (4 mg total) by mouth daily with breakfast.  . solifenacin (VESICARE) 5 MG tablet Take 5 mg by mouth daily.   No facility-administered encounter medications on file as of 07/13/2018.     Review of Systems:  Review of Systems  Constitutional: Positive for malaise/fatigue. Negative for chills and fever.  HENT: Negative for congestion and hearing loss.   Eyes: Negative for blurred vision.       Glasses  Respiratory: Negative for cough and shortness of breath.   Cardiovascular: Negative for chest pain, palpitations and leg swelling.  Gastrointestinal: Negative for abdominal pain, blood in stool, constipation, diarrhea and melena.  Genitourinary: Positive for frequency. Negative for dysuria and urgency.  Musculoskeletal: Negative for falls and joint pain.  Skin: Negative for itching and rash.  Neurological: Positive for dizziness and headaches. Negative for loss of consciousness.  Psychiatric/Behavioral: Negative for depression and memory loss. The patient is nervous/anxious. The patient does not have insomnia.     Health Maintenance  Topic Date Due  . INFLUENZA VACCINE  04/30/2018  . TETANUS/TDAP  09/10/2026  . PNA vac Low Risk Adult  Completed    Physical  Exam: Vitals:   07/13/18 1455  BP: (!) 200/100  Pulse: 65  Temp: (!) 97.3 F (36.3 C)  TempSrc: Oral  SpO2: 97%  Weight: 181 lb (82.1 kg)  Height: '5\' 7"'  (1.702 m)   Body mass index is 28.35 kg/m. Physical Exam  Constitutional: He is oriented to person, place, and time. He appears well-developed and well-nourished. No distress.  HENT:  Head: Normocephalic and atraumatic.  Eyes: Pupils are equal, round, and reactive to light. EOM are normal.  Cardiovascular: Normal rate, regular rhythm, normal heart sounds and intact distal pulses.  Pulmonary/Chest: Effort normal and breath sounds normal. No respiratory distress.  Abdominal: Bowel sounds are normal.  Musculoskeletal: Normal range of motion.  Neurological: He is alert and oriented to person, place, and time. He displays normal reflexes. No cranial nerve deficit or sensory deficit. He exhibits normal muscle tone. Coordination normal.  Skin: Skin is warm and dry. Capillary refill takes less than 2 seconds.  Psychiatric: He has a normal mood and affect.    Labs reviewed: Basic Metabolic Panel: Recent Labs    07/14/17 0124 09/25/17 0916 03/06/18  NA 135 137 136*  K 4.1 5.4* 4.1  CL 103 105  --   CO2 23 20  --   GLUCOSE 126* 126*  --   BUN 23* 22 16  CREATININE 1.44* 1.47* 1.4*  CALCIUM 9.4 10.3  --    Liver Function Tests: No results for input(s): AST, ALT, ALKPHOS, BILITOT, PROT, ALBUMIN in the last 8760 hours. No results for input(s): LIPASE, AMYLASE in the last 8760 hours. No results for input(s): AMMONIA in the last 8760 hours. CBC: Recent Labs    07/14/17 0124 03/06/18  WBC 7.1 6.4  NEUTROABS 4.9  --   HGB 14.4 14.7  HCT 44.6 42  MCV 95.9  --   PLT 259 317   Lipid Panel: Recent Labs    03/06/18  CHOL 162  HDL 53  LDLCALC 85  TRIG 143   Lab Results  Component Value Date   HGBA1C 5.3 03/06/2018    Assessment/Plan 1. Uncontrolled hypertension - bp trended up after we met, tx with clonidine  -  cloNIDine (CATAPRES) tablet 0.1 mg -repeat bp after catapres:  220/100, then decreased to 458 systolic 10 more mins later  -increase hydralazine as ordered by cardiology ([facility])--turns out he never got the 41m or 536mpills--was still taking just 1050mhree times a day  2. Mixed hyperlipidemia -LDL was near goal last check and could not afford other statins and tolerates pravachol well  3. Overweight (BMI 25.0-29.9) -cont to work on diet and exercise with less beer  4. Body mass index 28.0-28.9, adult -remains overweight despite his walking -counseled on decreased beer intake  5.  Hyperglycemia -cont to work on dietary choices with decreased portions especially of starchy carbs  6. Chronic kidney disease, stage 3 (HCC) -was stable last labs, refuses labs here -Avoid nephrotoxic agents like nsaids, dose adjust renally excreted meds, hydrate.  7.  Need for influenza:  Vaccine given  Labs/tests ordered:  Clonidine given  Next appt:  09/18/2018   Jaycee Pelzer L. Jasiyah Paulding, D.O. [facility] [address], [place] [address] Cell Phone (Mon-Fri 8am-5pm):  [phone] On Call:  [phone] & follow prompts after 5pm & weekends Office Phone:  [phone] Office Fax:  [phone]

## 2018-07-13 NOTE — Patient Instructions (Addendum)
Cut down on your alcohol intake. Take hydralazine 10mg --5 pills three times a day to use up the ones you've bought. Pick up your hydralazine 50mg  pills when you use up the 10mg  pills. Keep appointment with cardiology Thursday. I want to see you in 2 weeks if your blood pressure is still high at that appointment.

## 2018-07-13 NOTE — Addendum Note (Signed)
Addended by: Despina Hidden on: 07/13/2018 04:06 PM   Modules accepted: Orders

## 2018-07-16 ENCOUNTER — Ambulatory Visit (INDEPENDENT_AMBULATORY_CARE_PROVIDER_SITE_OTHER): Payer: Medicare Other | Admitting: Pharmacist

## 2018-07-16 VITALS — BP 174/82 | HR 66

## 2018-07-16 DIAGNOSIS — I10 Essential (primary) hypertension: Secondary | ICD-10-CM | POA: Diagnosis not present

## 2018-07-16 MED ORDER — AMLODIPINE BESYLATE 10 MG PO TABS: 10.0000 mg | ORAL_TABLET | Freq: Every day | ORAL | 3 refills | Status: AC

## 2018-07-16 MED ORDER — HYDRALAZINE HCL 100 MG PO TABS: 100.0000 mg | ORAL_TABLET | Freq: Three times a day (TID) | ORAL | 11 refills | Status: DC

## 2018-07-16 NOTE — Patient Instructions (Addendum)
Take 5 of your 10mg  hydralazine tablets 3 times a day (space your doses 8 hours apart - 6am, 1-2pm, 9pm)  When you run out, pick up your new prescription for hydralazine 100mg  tablets and take 1 tablet 3 times a day  Check your amlodipine - you should take 1 of the 10mg  tablets each day, not 2  Continue taking your amlodipine 10mg  and losartan 100mg  every morning  Continue taking your metoprolol 200mg  but take this at night before you go to bed   Follow up in clinic in 4 weeks for blood pressure check   Blood pressure medications:  6am:  - Amlodipine 10mg  - Losartan 100mg  - Hydralazine 50mg  (take 5 of the 10mg  tablets, when you run out, start new prescription for 1 of the 100mg  tablets)  1-2pm: - Hydralazine 50mg  (take 5 of the 10mg  tablets, when you run out, start new prescription for 1 of the 100mg  tablets)  9pm: - Metoprolol 200mg  - Hydralazine 50mg  (take 5 of the 10mg  tablets, when you run out, start new prescription for 1 of the 100mg  tablets)

## 2018-07-16 NOTE — Progress Notes (Signed)
Patient ID: Douglas Lowery                 DOB: 01-06-1941                      MRN: [id number]     HPI: Douglas Lowery is a 77 y.o. male patient of Dr Irish Lack referred by Lyda Jester, PA to HTN clinic. PMH is significant for HTN, HLD, CKD, and BPH. Renal artery dopplers in 2017 revealed no significant renal artery stenosis. Bilateral carotid artery dopplers in 2018 showed 1-39% right ICA stenosis. He was last seen in HTN clinic on 05/28/18 with a BP of 150/88. His hydralazine was increased to 50 mg TID. There were questions about his adherence to hydralazine at the last visit as he seemed to be taking it once daily most days. He has since seen his PCP on 07/13/18 3 days ago and  BP was severely elevated at 200/100 and he was given clonidine in the office with improvement in systolic BP to 027. He was also taking the incorrect dose of hydralazine and was still taking 10mg  TID. He was advised to use up his 10mg  tablets and take 5 tablets 3x a day.  Pt presents today in good spirits. He is still taking his hydralazine incorrectly and is taking 50mg  once a day (5 of the 10mg  tablets). He did not pick up the 50mg  tablet strength yet. He feels a bit sleepy after taking his Toprol. He is currently taking this with dinner. Reports he has cut back a bit on fast food. Denies blurred vision, does have an occasional headache in the morning prior to taking BP medications. He is unsure of his amlodipine dose - 20mg  is listed on med list, however max effective dose is typically 10mg .  Current HTN meds:  Amlodipine 10mg  daily (AM) - may be taking 20mg  Hydralazine 50mg  TID - only taking this once a day Losartan 100mg  daily (AM) Toprol 200mg  daily (PM)  Previously tried: lisinopril - cough  BP goal: <130/48mmHg  Family History: Sister with HTN  Social History: Occasional alcohol use, denies tobacco and illicit drug use.  Diet: He is not eating as much. He states he is not doing fast foods during lunch  on weekdays. However for lunch, [employer] where he lives (brocolli, peaches, casserole, baked chicken, ham, tuna/chicken salad). Vegetables fill about 1/4 plate. Has milk every day and OJ every 3 days. Does not add salt to food. Occasional Pepsi, no other caffeine.  Exercise: Walking each day, stays on his feet.  Home BP readings: Does not check, [facility] and recalls readings of 130s/80s.  Wt Readings from Last 3 Encounters:  07/13/18 181 lb (82.1 kg)  04/14/18 182 lb 12.8 oz (82.9 kg)  03/05/18 180 lb (81.6 kg)   BP Readings from Last 3 Encounters:  07/13/18 (!) 200/100  05/28/18 (!) 150/88  05/05/18 (!) 172/84   Pulse Readings from Last 3 Encounters:  07/13/18 65  05/28/18 (!) 58  05/05/18 63    Renal function: CrCl cannot be calculated (Patient's most recent lab result is older than the maximum 21 days allowed.).  Past Medical History:  Diagnosis Date  . Allergic rhinitis    seasonal  . BPH (benign prostatic hyperplasia)   . Carotid bruit    Carotid US 5/18:  1-39% RICA stenosis; Prominent R subclavian artery, measuring 1.4 cm AP x 1.5 cm TRV. - FU prn  . Cervicalgia  02/28/2016  . Chronic kidney disease   . Hypercholesteremia   . Hypertension     Current Outpatient Medications on File Prior to Visit  Medication Sig Dispense Refill  . amLODipine (NORVASC) 10 MG tablet TAKE 2 TABLETS BY MOUTH EVERY DAY 180 tablet 1  . aspirin 81 MG chewable tablet Chew 81 mg by mouth daily.    . fenofibrate 54 MG tablet Take one tablet by mouth once daily 90 tablet 1  . hydrALAZINE (APRESOLINE) 50 MG tablet Take 1 tablet (50 mg total) by mouth 3 (three) times daily. 90 tablet 11  . losartan (COZAAR) 100 MG tablet Take 1 tablet (100 mg total) by mouth daily. 90 tablet 3  . metoprolol (TOPROL-XL) 200 MG 24 hr tablet Take 1 tablet (200 mg total) by mouth daily. 90 tablet 1  . omeprazole (PRILOSEC) 20 MG capsule Take 1 capsule (20 mg total) by mouth daily before  supper. 90 capsule 1  . pravastatin (PRAVACHOL) 80 MG tablet Take 1 tablet (80 mg total) by mouth daily. 30 tablet 11  . silodosin (RAPAFLO) 4 MG CAPS capsule Take 1 capsule (4 mg total) by mouth daily with breakfast. 30 capsule 3  . solifenacin (VESICARE) 5 MG tablet Take 5 mg by mouth daily.     No current facility-administered medications on file prior to visit.     Allergies  Allergen Reactions  . Lisinopril Cough     Assessment/Plan:  1. Hypertension - BP remains elevated above goal <130/98mmHg. Pt still confused regarding hydralazine dosing. Spent extensive amount of time discussing medications and timing as well as writing out clear dosing schedule for HTN medications. Pt will increase hydralazine from 50mg  once a day to 50mg  3x a day until he runs out of 10mg  tablets (he is aware to take 5 tablets TID). When he runs out, will pick up rx for higher strength of hydralazine 100mg  TID and start taking 1 tablet TID. Will continue losartan 100mg  AM, decrease amlodipine to 10mg  AM (max recommended daily dose), and continue Toprol 200mg  daily but move dosing to before bedtime to help decrease fatigue. He will keep f/u with PCP on Monday (~4 days after frequency increase of hydralazine - pt should be on 50mg  TID at that time with plan to increase to 100mg  TID after home supply of tablets runs out). F/u in HTN clinic in 4 weeks.   Khiree Bukhari E. Timotheus Salm, PharmD, BCACP, [facility] [address], [place], [place] [address] Phone: [phone]; Fax: [phone] 07/16/2018 3:06 PM

## 2018-07-20 ENCOUNTER — Ambulatory Visit (INDEPENDENT_AMBULATORY_CARE_PROVIDER_SITE_OTHER): Payer: Medicare Other | Admitting: Nurse Practitioner

## 2018-07-20 ENCOUNTER — Telehealth: Payer: Self-pay

## 2018-07-20 ENCOUNTER — Encounter: Payer: Self-pay | Admitting: Nurse Practitioner

## 2018-07-20 ENCOUNTER — Ambulatory Visit: Payer: Medicare Other

## 2018-07-20 VITALS — BP 174/86 | HR 69 | Temp 98.0°F | Ht 67.0 in | Wt 184.3 lb

## 2018-07-20 DIAGNOSIS — I10 Essential (primary) hypertension: Secondary | ICD-10-CM | POA: Diagnosis not present

## 2018-07-20 NOTE — Patient Instructions (Signed)
Take  hydralazine 100 mg tablets 3 times a day (space your doses 8 hours apart - 6am, 1-2pm, 9pm) Go to pharmacy and get new prescription for 100 mg tablets you will take 1 tablet 3 times daily  continue all other medication as prescribed   Blood pressure medications:  6am: - Amlodipine 10mg  - Losartan 100mg  - Hydralazine 100mg  tablet  1-2pm: - Hydralazine 100 mg tablets  9pm: - Metoprolol 200mg  - Hydralazine 100 mg tablets

## 2018-07-20 NOTE — Progress Notes (Signed)
Careteam: Patient Care Team: Gayland Curry, DO as PCP - General (Geriatric Medicine) Clent Jacks, MD as Consulting Physician (Ophthalmology) Zadie Rhine Clent Demark, MD as Consulting Physician (Ophthalmology)  Advanced Directive information Does Patient Have a Medical Advance Directive?: No  Allergies  Allergen Reactions  . Lisinopril Cough    Chief Complaint  Patient presents with  . Follow-up    Pt is being seen for a 1 week follow up on blood pressure.      HPI: Patient is a 77 y.o. male seen in the office today to follow up blood pressure. Pt was seen last week by Dr Mariea Clonts with elevated bp as high as 220/100, given clonidine in office with improvement to 093 systolic. Pt was not taking increase dose of hydralazine as ordered by cardiology. Meet with the pharmacist on 07/16/18 and still was taking hydralazine in correctly (50 mg daily). Pt also taking metoprolol, losartan, amlodipine for blood pressure.  Given increased instructions by Pharm D for how to take medication properly.  hydralazine was increased to 100 mg TID at that time.  It appears he is still taking 50 mg of hydralazine and possibly only taking once daily- gets very agitated when asked how he is taking medication but states he was taking 5 tablets of hydralazine 3 times daily   Review of Systems:  Review of Systems  Constitutional: Negative for chills and fever.  HENT: Negative for congestion.   Eyes: Negative for blurred vision.       Glasses  Respiratory: Negative for cough and shortness of breath.   Cardiovascular: Negative for chest pain, palpitations and leg swelling.  Musculoskeletal: Negative for falls and joint pain.  Neurological: Negative for dizziness, loss of consciousness and headaches.  Psychiatric/Behavioral: Negative for depression and memory loss. The patient is nervous/anxious. The patient does not have insomnia.     Past Medical History:  Diagnosis Date  . Allergic rhinitis    seasonal    . BPH (benign prostatic hyperplasia)   . Carotid bruit    Carotid US 5/18:  1-39% RICA stenosis; Prominent R subclavian artery, measuring 1.4 cm AP x 1.5 cm TRV. - FU prn  . Cervicalgia 02/28/2016  . Chronic kidney disease   . Hypercholesteremia   . Hypertension    Past Surgical History:  Procedure Laterality Date  . COLONOSCOPY  02/2009   Sessile polyp in the rectum. Otherwise normal examination  . EYE SURGERY  2013   Right eye cataract removed--Dr. Katy Fitch   Social History:   reports that he has never smoked. He has never used smokeless tobacco. He reports that he drinks alcohol. He reports that he does not use drugs.  Family History  Problem Relation Age of Onset  . Pneumonia Father   . Alzheimer's disease Sister   . Hypertension Sister   . Hypertension Sister   . Hypertension Sister   . Heart attack Neg Hx     Medications: Patient's Medications  New Prescriptions   No medications on file  Previous Medications   AMLODIPINE (NORVASC) 10 MG TABLET    Take 1 tablet (10 mg total) by mouth daily.   ASPIRIN 81 MG CHEWABLE TABLET    Chew 81 mg by mouth daily.   FENOFIBRATE 54 MG TABLET    Take one tablet by mouth once daily   HYDRALAZINE (APRESOLINE) 100 MG TABLET    Take 1 tablet (100 mg total) by mouth 3 (three) times daily.   LOSARTAN (COZAAR) 100 MG TABLET  Take 1 tablet (100 mg total) by mouth daily.   METOPROLOL (TOPROL-XL) 200 MG 24 HR TABLET    Take 1 tablet (200 mg total) by mouth daily.   OMEPRAZOLE (PRILOSEC) 20 MG CAPSULE    Take 1 capsule (20 mg total) by mouth daily before supper.   PRAVASTATIN (PRAVACHOL) 80 MG TABLET    Take 1 tablet (80 mg total) by mouth daily.   SILODOSIN (RAPAFLO) 4 MG CAPS CAPSULE    Take 1 capsule (4 mg total) by mouth daily with breakfast.   SOLIFENACIN (VESICARE) 5 MG TABLET    Take 5 mg by mouth daily.  Modified Medications   No medications on file  Discontinued Medications   No medications on file     Physical Exam:  Vitals:    07/20/18 1258  BP: (!) 210/96  Pulse: 69  Temp: 98 F (36.7 C)  TempSrc: Oral  SpO2: 98%  Weight: 184 lb 4.8 oz (83.6 kg)  Height: 5\' 7"  (1.702 m)   Body mass index is 28.87 kg/m.  Physical Exam  Constitutional: He is oriented to person, place, and time. He appears well-developed and well-nourished. No distress.  HENT:  Head: Normocephalic and atraumatic.  Eyes: Pupils are equal, round, and reactive to light. EOM are normal.  Cardiovascular: Normal rate, regular rhythm, normal heart sounds and intact distal pulses.  Pulmonary/Chest: Effort normal and breath sounds normal. No respiratory distress.  Musculoskeletal: He exhibits no edema.  Neurological: He is alert and oriented to person, place, and time. He displays normal reflexes. No cranial nerve deficit or sensory deficit. He exhibits normal muscle tone. Coordination normal.  Skin: Skin is warm and dry.  Psychiatric: His affect is angry. His speech is rapid and/or pressured.    Labs reviewed: Basic Metabolic Panel: Recent Labs    09/25/17 0916 03/06/18  NA 137 136*  K 5.4* 4.1  CL 105  --   CO2 20  --   GLUCOSE 126*  --   BUN 22 16  CREATININE 1.47* 1.4*  CALCIUM 10.3  --    Liver Function Tests: No results for input(s): AST, ALT, ALKPHOS, BILITOT, PROT, ALBUMIN in the last 8760 hours. No results for input(s): LIPASE, AMYLASE in the last 8760 hours. No results for input(s): AMMONIA in the last 8760 hours. CBC: Recent Labs    03/06/18  WBC 6.4  HGB 14.7  HCT 42  PLT 317   Lipid Panel: Recent Labs    03/06/18  CHOL 162  HDL 53  LDLCALC 85  TRIG 143   TSH: No results for input(s): TSH in the last 8760 hours. A1C: Lab Results  Component Value Date   HGBA1C 5.3 03/06/2018     Assessment/Plan 1. Uncontrolled hypertension -very agitated when asked about his blood pressure medications.  -he is agreeable and plans to pick up hydralazine 100 mg tablet today and understands to take TID. To continue  all other medication as prescribed, to follow up in 4 days on blood pressure.   Carlos American. [place], [facility] [phone]

## 2018-07-20 NOTE — Telephone Encounter (Signed)
Error

## 2018-07-23 ENCOUNTER — Ambulatory Visit: Payer: Medicare Other | Admitting: Nurse Practitioner

## 2018-07-23 ENCOUNTER — Telehealth: Payer: Self-pay

## 2018-07-23 ENCOUNTER — Ambulatory Visit: Payer: Medicare Other

## 2018-07-23 NOTE — Telephone Encounter (Signed)
NP, Sherrie Mustache informed me a patient was waiting at the front desk and possibly needed assistance.  I went to the front desk to find patient waiting and I asked If someone had helped him and patient responded no, "I need my blood pressure checked"  I informed patient we will gladly check his blood pressure and I could schedule him an appointment for today (not know he had an appointment pending with Clarise Cruz, RN within 5 min of conversation we were having). Patient appeared agitated and stated " I been sitting up her waiting and I need my blood pressure checked, you know what forget it." Patient then stormed out the entrance door.   Back story: Suzette, receptionist/billing coordnator was already assisting patient and had went to inform Sara,RN patient was arrived  I informed Rodena Piety May, CMA (Clinical Intake assistant) that patient was upset and would not allow me to help him. Rodena Piety suddenly stated he has an appointment with Sara,RN in a few minutes.  I then ran out of the door to redirect patient back into the building. Patient was in his car backing up and paused for a second while I asked if he would come back in for his now 12:30 pm appointment. Patient stated I been sitting in there waiting and all I need is a blood pressure check (patient had been on [place] for 15 min). I apologize that patient had waited 15 min and tried to assure him that his appointment time was now with no additional waiting. Patient said " Get out my way lady, I am going home to take a nap and I need to get off this (inappropriate word) medicine (patient held up his bottle of hydralazine). Patient repeated "Get out my way lady."   I returned to the building to avoid further agitation of patient.  Clarise Cruz, RN was waiting at the door to call patient back for his appointment when I returned into the building. I informed her of this encounter and advised patient had left the campus without being seen.   Side note: Patient was  scheduled to see Sarn,RN for a medicare wellness visit at 12:30 and then was to see Jessica at 1:00 pm for a recheck on blood pressure.  Message routed to Sherrie Mustache, NP whom patient was to see.  S.Chrae B/CMA

## 2018-07-23 NOTE — Telephone Encounter (Signed)
Noted.  Let's call him tomorrow or monday to try to reschedule his visits.  I would avoid calling today to allow him to calm down.  He is frustrated that his blood pressure won't come down, but is struggling to take the medicines properly as directed.

## 2018-07-23 NOTE — Telephone Encounter (Signed)
Noted, fwding to Dr Mariea Clonts, pts PCP for [facility]

## 2018-07-24 NOTE — Telephone Encounter (Signed)
I called patient x 2, recording stating call could not be completed as dialed

## 2018-07-27 NOTE — Telephone Encounter (Signed)
I called patient, recording stated call could not be completed as dialed. I will mail appointment reminder letter to patient

## 2018-08-13 ENCOUNTER — Ambulatory Visit: Payer: Medicare Other

## 2018-09-18 ENCOUNTER — Ambulatory Visit: Payer: Medicare Other

## 2018-09-18 ENCOUNTER — Ambulatory Visit: Payer: Self-pay

## 2018-11-04 DIAGNOSIS — H353130 Nonexudative age-related macular degeneration, bilateral, stage unspecified: Secondary | ICD-10-CM | POA: Diagnosis not present

## 2018-11-04 DIAGNOSIS — H40053 Ocular hypertension, bilateral: Secondary | ICD-10-CM | POA: Diagnosis not present

## 2018-11-04 DIAGNOSIS — Z961 Presence of intraocular lens: Secondary | ICD-10-CM | POA: Diagnosis not present

## 2018-11-04 DIAGNOSIS — H40023 Open angle with borderline findings, high risk, bilateral: Secondary | ICD-10-CM | POA: Diagnosis not present

## 2019-01-02 ENCOUNTER — Other Ambulatory Visit: Payer: Self-pay | Admitting: Interventional Cardiology

## 2019-01-19 ENCOUNTER — Other Ambulatory Visit: Payer: Self-pay | Admitting: Internal Medicine

## 2019-01-19 DIAGNOSIS — I10 Essential (primary) hypertension: Secondary | ICD-10-CM

## 2019-01-19 NOTE — Telephone Encounter (Signed)
Pt walked out 06/2018 without being seen and his medications where challenging and we're not sure what he has been taking. Pt needs appt with Dr. Mariea Clonts or cardiology, they have denied his refills stating he needs appointment also.

## 2019-01-24 ENCOUNTER — Encounter: Payer: Self-pay | Admitting: Internal Medicine

## 2019-02-08 ENCOUNTER — Emergency Department (HOSPITAL_COMMUNITY)
Admission: EM | Admit: 2019-02-08 | Discharge: 2019-02-08 | Disposition: A | Discharge status: Home / Self Care | Payer: Medicare Other | Attending: Emergency Medicine | Admitting: Emergency Medicine

## 2019-02-08 ENCOUNTER — Encounter (HOSPITAL_COMMUNITY): Payer: Self-pay | Admitting: Emergency Medicine

## 2019-02-08 ENCOUNTER — Emergency Department (HOSPITAL_COMMUNITY): Payer: Medicare Other

## 2019-02-08 ENCOUNTER — Other Ambulatory Visit: Payer: Self-pay

## 2019-02-08 DIAGNOSIS — Z7982 Long term (current) use of aspirin: Secondary | ICD-10-CM | POA: Diagnosis not present

## 2019-02-08 DIAGNOSIS — I129 Hypertensive chronic kidney disease with stage 1 through stage 4 chronic kidney disease, or unspecified chronic kidney disease: Secondary | ICD-10-CM | POA: Diagnosis not present

## 2019-02-08 DIAGNOSIS — R51 Headache: Secondary | ICD-10-CM | POA: Diagnosis not present

## 2019-02-08 DIAGNOSIS — Z79899 Other long term (current) drug therapy: Secondary | ICD-10-CM | POA: Insufficient documentation

## 2019-02-08 DIAGNOSIS — G44229 Chronic tension-type headache, not intractable: Secondary | ICD-10-CM | POA: Insufficient documentation

## 2019-02-08 DIAGNOSIS — G44221 Chronic tension-type headache, intractable: Secondary | ICD-10-CM | POA: Diagnosis not present

## 2019-02-08 DIAGNOSIS — I10 Essential (primary) hypertension: Secondary | ICD-10-CM | POA: Diagnosis not present

## 2019-02-08 DIAGNOSIS — N183 Chronic kidney disease, stage 3 (moderate): Secondary | ICD-10-CM | POA: Diagnosis not present

## 2019-02-08 LAB — C-REACTIVE PROTEIN: CRP: <0.8 mg/dL (ref <1.0)

## 2019-02-08 LAB — BASIC METABOLIC PANEL
Anion gap: 10 (ref 5–15)
BUN: 14 mg/dL (ref 8–23)
CO2: 23 mmol/L (ref 22–32)
Calcium: 9.8 mg/dL (ref 8.9–10.3)
Chloride: 103 mmol/L (ref 98–111)
Creatinine, Ser: 1.25 mg/dL — ABNORMAL HIGH (ref 0.61–1.24)
GFR calc Af Amer: >60 mL/min (ref >60)
GFR calc non Af Amer: 55 mL/min — ABNORMAL LOW (ref >60)
Glucose, Bld: 115 mg/dL — ABNORMAL HIGH (ref 70–99)
Potassium: 3.6 mmol/L (ref 3.5–5.1)
Sodium: 136 mmol/L (ref 135–145)

## 2019-02-08 LAB — CBC WITH DIFFERENTIAL/PLATELET
Abs Immature Granulocytes: 0.02 10*3/uL (ref 0.00–0.07)
Basophils Absolute: 0.1 10*3/uL (ref 0.0–0.1)
Basophils Relative: 1 %
Eosinophils Absolute: 0.3 10*3/uL (ref 0.0–0.5)
Eosinophils Relative: 6 %
HCT: 51.3 % (ref 39.0–52.0)
Hemoglobin: 16.5 g/dL (ref 13.0–17.0)
Immature Granulocytes: 0 %
Lymphocytes Relative: 32 %
Lymphs Abs: 1.7 10*3/uL (ref 0.7–4.0)
MCH: 30.5 pg (ref 26.0–34.0)
MCHC: 32.2 g/dL (ref 30.0–36.0)
MCV: 94.8 fL (ref 80.0–100.0)
Monocytes Absolute: 0.4 10*3/uL (ref 0.1–1.0)
Monocytes Relative: 8 %
Neutro Abs: 2.9 10*3/uL (ref 1.7–7.7)
Neutrophils Relative %: 53 %
Platelets: 287 10*3/uL (ref 150–400)
RBC: 5.41 MIL/uL (ref 4.22–5.81)
RDW: 12.4 % (ref 11.5–15.5)
WBC: 5.4 10*3/uL (ref 4.0–10.5)
nRBC: 0 % (ref 0.0–0.2)

## 2019-02-08 LAB — SEDIMENTATION RATE: Sed Rate: 3 mm/hr (ref 0–16)

## 2019-02-08 MED ORDER — CLONIDINE HCL 0.1 MG PO TABS
0.1000 mg | ORAL_TABLET | Freq: Once | ORAL | Status: AC
  Administered 2019-02-08: 13:00:00 0.1 mg via ORAL
  Filled 2019-02-08: qty 1

## 2019-02-08 MED ORDER — HYDROCODONE-ACETAMINOPHEN 5-325 MG PO TABS
2.0000 | ORAL_TABLET | Freq: Once | ORAL | Status: AC
  Administered 2019-02-08: 13:00:00 2 via ORAL
  Filled 2019-02-08: qty 2

## 2019-02-08 MED ORDER — HYDROCODONE-ACETAMINOPHEN 5-325 MG PO TABS: 1.0000 | ORAL_TABLET | Freq: Four times a day (QID) | ORAL | 0 refills | Status: DC | PRN

## 2019-02-08 NOTE — ED Notes (Signed)
Patient verbalizes understanding of discharge instructions. Opportunity for questioning and answers were provided. Armband removed by staff, pt discharged from ED.  

## 2019-02-08 NOTE — Discharge Instructions (Addendum)
As we discussed, your headache could be a symptom or a cause of your high blood pressure.  I agree with holding the hydralazine, the 3 times a day pill that was prescribed to you previously, if it continues to make you lightheaded.  For now, we are going to give you a short course of pain medications as needed.  Continue taking your other medications.  I recommend calling Dr. Mariea Clonts tomorrow to discuss further medication changes and follow-up as needed.

## 2019-02-08 NOTE — ED Provider Notes (Signed)
[facility] EMERGENCY DEPARTMENT Provider Note   CSN: [id number] Arrival date & time: 02/08/19  1036    History   Chief Complaint Chief Complaint  Patient presents with  . Headache  . Hypertension    HPI Douglas Lowery is a 78 y.o. male.     HPI   78 yo M with h/o CKD, HTN, HLD, here with mild frontal HA.  The patient states that his headache began gradually 3 days ago.  It is a bilateral, aching, throbbing, frontal headache that is worse with palpation of his bilateral temple areas.  Denies any vision changes.  No focal numbness or weakness.  Headache is fairly constant.  He also notes that he stopped taking his hydralazine recently because it was making him dizzy when he took it.  He does note that he is had headache with high blood pressure in the past, that the headache is more persistent than usual.  His blood pressures been elevated at home.  Denies any chest pain or shortness of breath.  Denies any joint, particularly shoulder or hip, arthralgias or myalgias.  No history of autoimmune disease.  No other recent medication change.  No fevers or chills.  No neck pain or neck stiffness.  No photophobia or phonophobia.  Headache does not specifically worsen with bright lights or loud noises.  No alleviating factors.  Records reviewed - h/o chronic HTN, up to 220/120 in clinic w/ complaints of mild HA. On multiple meds, most recently hydral was added/increased. On ASA daily, no other blood thinner use.  Past Medical History:  Diagnosis Date  . Allergic rhinitis    seasonal  . BPH (benign prostatic hyperplasia)   . Carotid bruit    Carotid US 5/18:  1-39% RICA stenosis; Prominent R subclavian artery, measuring 1.4 cm AP x 1.5 cm TRV. - FU prn  . Cervicalgia 02/28/2016  . Chronic kidney disease   . Hypercholesteremia   . Hypertension     Patient Active Problem List   Diagnosis Date Noted  . Hyperglycemia 01/10/2017  . Cervicalgia 02/28/2016  .  Uncontrolled hypertension 12/28/2015  . Chronic rhinitis 12/06/2015  . Hypertrophy of nasal turbinates 12/06/2015  . Benign growth on outer layer of left eye 10/28/2015  . 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 Essential hypertension, benign 12/02/2013  . Mixed hyperlipidemia 12/02/2013  . Allergic rhinitis due to pollen 12/21/2012  . BPH (benign prostatic hyperplasia) 12/21/2012  . Cerumen impaction 12/21/2012  . Chronic kidney disease, stage 3 (HCC)     Past Surgical History:  Procedure Laterality Date  . COLONOSCOPY  02/2009   Sessile polyp in the rectum. Otherwise normal examination  . EYE SURGERY  2013   Right eye cataract removed--Dr. Katy Fitch        Home Medications    Prior to Admission medications   Medication Sig Start Date End Date Taking? Authorizing Provider  amLODipine (NORVASC) 10 MG tablet Take 1 tablet (10 mg total) by mouth daily. 07/16/18 07/16/19  Jettie Booze, MD  aspirin 81 MG chewable tablet Chew 81 mg by mouth daily.    [provider]  fenofibrate 54 MG tablet Take one tablet by mouth once daily 12/05/17   Reed, Tiffany L, DO  hydrALAZINE (APRESOLINE) 100 MG tablet Take 1 tablet (100 mg total) by mouth 3 (three) times daily. 07/16/18   Jettie Booze, MD  HYDROcodone-acetaminophen (NORCO/VICODIN) 5-325 MG tablet Take 1 tablet by mouth every 6 (six) hours as needed for  severe pain. 02/08/19   Duffy Bruce, MD  losartan (COZAAR) 100 MG tablet Take 1 tablet (100 mg total) by mouth daily. Please make annual appt with Dr. Irish Lack for future refills. Thank you 01/04/19   Jettie Booze, MD  metoprolol (TOPROL-XL) 200 MG 24 hr tablet Take 1 tablet (200 mg total) by mouth daily. 03/05/18   Reed, Tiffany L, DO  omeprazole (PRILOSEC) 20 MG capsule Take 1 capsule (20 mg total) by mouth daily before supper. 12/16/17   Reed, Tiffany L, DO  pravastatin (PRAVACHOL) 80 MG tablet Take 1 tablet (80 mg total) by mouth daily. 05/05/18   Lyda Jester M, PA-C  silodosin (RAPAFLO) 4 MG CAPS capsule Take 1 capsule (4 mg total) by mouth daily with breakfast. 05/19/17   Mariea Clonts, Tiffany L, DO  solifenacin (VESICARE) 5 MG tablet Take 5 mg by mouth daily.    [provider]    Family History Family History  Problem Relation Age of Onset  . Pneumonia Father   . Alzheimer's disease Sister   . Hypertension Sister   . Hypertension Sister   . Hypertension Sister   . Heart attack Neg Hx     Social History Social History   Tobacco Use  . Smoking status: Never Smoker  . Smokeless tobacco: Never Used  Substance Use Topics  . Alcohol use: Yes    Comment: 1 can beer every week  . Drug use: No     Allergies   Lisinopril   Review of Systems Review of Systems  Constitutional: Positive for fatigue. Negative for chills and fever.  HENT: Negative for congestion and rhinorrhea.   Eyes: Negative for visual disturbance.  Respiratory: Negative for cough, shortness of breath and wheezing.   Cardiovascular: Negative for chest pain and leg swelling.  Gastrointestinal: Negative for abdominal pain, diarrhea, nausea and vomiting.  Genitourinary: Negative for dysuria and flank pain.  Musculoskeletal: Negative for neck pain and neck stiffness.  Skin: Negative for rash and wound.  Allergic/Immunologic: Negative for immunocompromised state.  Neurological: Positive for headaches. Negative for syncope and weakness.  All other systems reviewed and are negative.    Physical Exam Updated Vital Signs BP (!) 145/76   Pulse 64   Temp 98.1 F (36.7 C) (Oral)   Resp 16   Wt 83.6 kg   SpO2 95%   BMI 28.87 kg/m   Physical Exam Vitals signs and nursing note reviewed.  Constitutional:      General: He is not in acute distress.    Appearance: He is well-developed.  HENT:     Head: Normocephalic and atraumatic.     Comments: Minimal tenderness over bilateral temporal arteries, Eyes:     Conjunctiva/sclera: Conjunctivae normal.   Neck:     Musculoskeletal: Neck supple.  Cardiovascular:     Rate and Rhythm: Normal rate and regular rhythm.     Heart sounds: Normal heart sounds. No murmur. No friction rub.  Pulmonary:     Effort: Pulmonary effort is normal. No respiratory distress.     Breath sounds: Normal breath sounds. No wheezing or rales.  Abdominal:     General: There is no distension.     Palpations: Abdomen is soft.     Tenderness: There is no abdominal tenderness.  Skin:    General: Skin is warm.     Capillary Refill: Capillary refill takes less than 2 seconds.  Neurological:     Mental Status: He is alert and oriented to person, place, and  time.     GCS: GCS eye subscore is 4. GCS verbal subscore is 5. GCS motor subscore is 6.     Motor: No abnormal muscle tone.     Neurological Exam:  Mental Status: Alert and oriented to person, place, and time. Attention and concentration normal. Speech clear. Recent memory is intact. Cranial Nerves: Visual fields grossly intact. EOMI and PERRLA. No nystagmus noted. Facial sensation intact at forehead, maxillary cheek, and chin/mandible bilaterally. No facial asymmetry or weakness. Hearing grossly normal. Uvula is midline, and palate elevates symmetrically. Normal SCM and trapezius strength. Tongue midline without fasciculations. Motor: Muscle strength 5/5 in proximal and distal UE and LE bilaterally. No pronator drift. Muscle tone normal. Reflexes: 2+ and symmetrical in all four extremities.  Sensation: Intact to light touch in upper and lower extremities distally bilaterally.  Gait: Normal without ataxia. Coordination: Normal FTN bilaterally.     ED Treatments / Results  Labs (all labs ordered are listed, but only abnormal results are displayed) Labs Reviewed  BASIC METABOLIC PANEL - Abnormal; Notable for the following components:      Result Value   Glucose, Bld 115 (*)    Creatinine, Ser 1.25 (*)    GFR calc non Af Amer 55 (*)    All other components  within normal limits  CBC WITH DIFFERENTIAL/PLATELET  SEDIMENTATION RATE  C-REACTIVE PROTEIN    EKG EKG Interpretation  Date/Time:  Monday Feb 08 2019 13:15:54 EDT Ventricular Rate:  82 PR Interval:    QRS Duration: 91 QT Interval:  360 QTC Calculation: 421 R Axis:   76 Text Interpretation:  Sinus rhythm Probable left atrial enlargement Probable LVH with secondary repol abnrm Anterior ST elevation, probably due to LVH Since last EKG, more evidence of LVH Otherwise no ischemic changes Confirmed by Duffy Bruce [phone]) on 02/08/2019 1:18:41 PM   Radiology Ct Head Wo Contrast  Result Date: 02/08/2019 CLINICAL DATA:  78 year old male with headache EXAM: CT HEAD WITHOUT CONTRAST TECHNIQUE: Contiguous axial images were obtained from the base of the skull through the vertex without intravenous contrast. COMPARISON:  10/16/2008 FINDINGS: Brain: No acute intracranial hemorrhage. No midline shift or mass effect. Gray-white differentiation maintained. Redemonstration of hypodensity within the bilateral periventricular white matter, most pronounced overlying the right posterior horn. Redemonstration of focal density of the left basal ganglia. Unremarkable appearance of the ventricular system. Vascular: Minimal intracranial atherosclerosis. Skull: No acute fracture.  No aggressive bone lesion identified. Sinuses/Orbits: Is surgical changes of the left globe. Unremarkable appearance of the orbits. No significant paranasal sinus disease. Other: None IMPRESSION: Negative for acute intracranial abnormality. 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   On: 02/08/2019 13:08    Procedures Procedures (including critical care time)  Medications Ordered in ED Medications  cloNIDine (CATAPRES) tablet 0.1 mg (0.1 mg Oral Given 02/08/19 1311)  HYDROcodone-acetaminophen (NORCO/VICODIN) 5-325 MG per  tablet 2 tablet (2 tablets Oral Given 02/08/19 1311)     Initial Impression / Assessment and Plan / ED Course  I have reviewed the triage vital signs and the nursing notes.  Pertinent labs & imaging results that were available during my care of the patient were reviewed by me and considered in my medical decision making (see chart for details).  Clinical Course as of Feb 08 1404  Mon Feb 07, 1469  7563 78 year old male here with mild, bitemporal headache.  While I suspect this is  secondary to his hypertension and possible tension type component given distribution around the head, must also consider intracranial hemorrhage given his history of blood thinner use.  It began gradually, and has been stable for several days up to a week with it, low concern for subarachnoid.  He has hypertension seems to be an ongoing issue but I suspect is mildly worsened in the setting of stopping his hydralazine as well as his headache.  Given his temporal tenderness, will also add on inflammatory labs though has no other symptoms to suggest temporal arteritis or polymyalgia rheumatica.  Will follow-up labs, imaging, and reassess.   [CI]  1318 Labs as above and very reassuring. CRP normal, making temporal arteritis less likely. CBC reassuring. BMP @ baseline. CT Head neg - reviewed by myself and Radiology. Plan to reassess pending response to meds.   [CI]  1354 Headache is completely resolved.  Blood pressure is 160s over 90s.  Will discharge with brief course of analgesics as a suspect his headache is possibly related to muscular tension and musculoskeletal pain and is likely contributing to his worsening hypertension.  Will have him call his PCP in the morning.   [CI]    Clinical Course User Index [CI] Duffy Bruce, MD        Final Clinical Impressions(s) / ED Diagnoses   Final diagnoses:  Chronic tension-type headache, not intractable  Essential hypertension    ED Discharge Orders         Ordered     HYDROcodone-acetaminophen (NORCO/VICODIN) 5-325 MG tablet  Every 6 hours PRN     02/08/19 1354           Duffy Bruce, MD 02/08/19 1405

## 2019-02-08 NOTE — ED Triage Notes (Signed)
Pt c/o frontal HA onset at 7pm last night. Has HTN, 183/101 in triage. Denies any other symptoms at this time.

## 2019-02-15 ENCOUNTER — Other Ambulatory Visit: Payer: Self-pay

## 2019-02-15 ENCOUNTER — Ambulatory Visit (INDEPENDENT_AMBULATORY_CARE_PROVIDER_SITE_OTHER): Payer: Medicare Other | Admitting: Internal Medicine

## 2019-02-15 ENCOUNTER — Encounter: Payer: Self-pay | Admitting: Internal Medicine

## 2019-02-15 VITALS — BP 200/100 | HR 72 | Temp 97.7°F | Ht 67.0 in | Wt 183.0 lb

## 2019-02-15 DIAGNOSIS — Z6828 Body mass index (BMI) 28.0-28.9, adult: Secondary | ICD-10-CM | POA: Diagnosis not present

## 2019-02-15 DIAGNOSIS — N183 Chronic kidney disease, stage 3 unspecified: Secondary | ICD-10-CM

## 2019-02-15 DIAGNOSIS — E663 Overweight: Secondary | ICD-10-CM | POA: Diagnosis not present

## 2019-02-15 DIAGNOSIS — E782 Mixed hyperlipidemia: Secondary | ICD-10-CM | POA: Diagnosis not present

## 2019-02-15 DIAGNOSIS — I10 Essential (primary) hypertension: Secondary | ICD-10-CM | POA: Diagnosis not present

## 2019-02-15 NOTE — Patient Instructions (Signed)
Make sure you take your hydralazine 10mg  THREE times a day.  It does not last a full day.   Continue to take your metoprolol succinate 200mg  once a day. Also take your amlodipine 10mg  one a day. Take your pravastatin and your fenofibrate at night.

## 2019-02-15 NOTE — Progress Notes (Signed)
Location:  [facility] clinic Provider:  Naylea Wigington L. Mariea Clonts, D.O., C.M.D.  Goals of Care:  Advanced Directives 02/08/2019  Does Patient Have a Medical Advance Directive? No  Type of Advance Directive -  Does patient want to make changes to medical advance directive? -  Copy of [place] in Chart? -  Would patient like information on creating a medical advance directive? No - Patient declined     Chief Complaint  Patient presents with  . Medical Management of Chronic Issues    24mth follow-up    HPI: Patient is a 78 y.o. male seen today for medical management of chronic diseases.    Says hydralazine 100mg  made him sick on his stomach and he vomited.  He is now only taking his hydralazine 10mg  once a day because "that lady up there told me to just take it once a day."  He is not taking it three times a day, just once.  He had to receive clonidine in the ED 02/08/19 b/c bp was sky high there.  No headache today even with BP 200/100.  Reports seeing well.  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  Gets annual checkup of eyes with Dr. Wynona Canes get new Rx if wanted to, but he did not.  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  Urinary frequency ongoing.  He's home a lot and sleeps more now.  No changes with urinary symptoms.  Had to use the restroom here once during his visit.    He has not been walking like he's supposed to.  He has been mowing some.    Past Medical History:  Diagnosis Date  . Allergic rhinitis    seasonal  . BPH (benign prostatic hyperplasia)   . Carotid bruit    Carotid US 5/18:  1-39% RICA stenosis; Prominent R subclavian artery, measuring 1.4 cm AP x 1.5 cm TRV. - FU prn  . Cervicalgia 02/28/2016  . Chronic kidney disease   . Hypercholesteremia   . Hypertension     Past Surgical History:  Procedure Laterality Date  . COLONOSCOPY  02/2009   Sessile polyp in the rectum. Otherwise normal examination  . EYE SURGERY   2013   Right eye cataract removed--Dr. Katy Fitch    Allergies  Allergen Reactions  . Lisinopril Cough    Outpatient Encounter Medications as of 02/15/2019  Medication Sig  . amLODipine (NORVASC) 10 MG tablet Take 1 tablet (10 mg total) by mouth daily.  Marland Kitchen aspirin 81 MG chewable tablet Chew 81 mg by mouth daily.  . fenofibrate 54 MG tablet Take one tablet by mouth once daily  . hydrALAZINE (APRESOLINE) 10 MG tablet Take 10 mg by mouth 3 (three) times daily.  . metoprolol (TOPROL-XL) 200 MG 24 hr tablet Take 1 tablet (200 mg total) by mouth daily.  . pravastatin (PRAVACHOL) 80 MG tablet Take 1 tablet (80 mg total) by mouth daily.  . silodosin (RAPAFLO) 4 MG CAPS capsule Take 1 capsule (4 mg total) by mouth daily with breakfast.  . solifenacin (VESICARE) 5 MG tablet Take 5 mg by mouth daily.  . [DISCONTINUED] hydrALAZINE (APRESOLINE) 100 MG tablet Take 1 tablet (100 mg total) by mouth 3 (three) times daily.  . [DISCONTINUED] HYDROcodone-acetaminophen (NORCO/VICODIN) 5-325 MG tablet Take 1 tablet by mouth every 6 (six) hours as needed for severe pain.  . [DISCONTINUED] losartan (COZAAR) 100 MG tablet Take 1 tablet (100 mg total) by mouth daily.  Please make annual appt with Dr. Irish Lack for future refills. Thank you  . [DISCONTINUED] omeprazole (PRILOSEC) 20 MG capsule Take 1 capsule (20 mg total) by mouth daily before supper.   No facility-administered encounter medications on file as of 02/15/2019.     Review of Systems:  Review of Systems  Constitutional: Negative for chills, fever and malaise/fatigue.  HENT: Negative for congestion and hearing loss.   Eyes: Negative for blurred vision.       Glasses  Respiratory: Negative for cough and shortness of breath.   Cardiovascular: Negative for chest pain, palpitations and leg swelling.  Gastrointestinal: Negative for abdominal pain, blood in stool, constipation, diarrhea and melena.  Genitourinary: Positive for frequency. Negative for dysuria.   Musculoskeletal: Negative for falls and joint pain.       No recent trouble with neck or shoulder pain that he's had before  Skin: Negative for itching and rash.  Neurological: Positive for headaches. Negative for dizziness, sensory change, loss of consciousness and weakness.  Endo/Heme/Allergies: Does not bruise/bleed easily.  Psychiatric/Behavioral: Negative for depression and memory loss. The patient is not nervous/anxious and does not have insomnia.     Health Maintenance  Topic Date Due  . INFLUENZA VACCINE  05/01/2019  . TETANUS/TDAP  09/10/2026  . PNA vac Low Risk Adult  Completed    Physical Exam: Vitals:   02/15/19 1145  BP: (!) 200/100  Pulse: 72  Temp: 97.7 F (36.5 C)  TempSrc: Oral  SpO2: 98%  Weight: 183 lb (83 kg)  Height: 5\' 7"  (1.702 m)   Body mass index is 28.66 kg/m. Physical Exam Vitals signs reviewed.  Constitutional:      Appearance: Normal appearance.  HENT:     Head: Normocephalic and atraumatic.  Cardiovascular:     Rate and Rhythm: Normal rate and regular rhythm.     Pulses: Normal pulses.     Heart sounds: Normal heart sounds.  Pulmonary:     Effort: Pulmonary effort is normal.     Breath sounds: Normal breath sounds.  Skin:    General: Skin is warm and dry.  Neurological:     General: No focal deficit present.     Mental Status: He is alert and oriented to person, place, and time. Mental status is at baseline.     Cranial Nerves: No cranial nerve deficit.     Motor: No weakness.     Gait: Gait normal.  Psychiatric:        Mood and Affect: Mood normal.     Labs reviewed: Basic Metabolic Panel: Recent Labs    03/06/18 02/08/19 1139  NA 136* 136  K 4.1 3.6  CL  --  103  CO2  --  23  GLUCOSE  --  115*  BUN 16 14  CREATININE 1.4* 1.25*  CALCIUM  --  9.8   Liver Function Tests: No results for input(s): AST, ALT, ALKPHOS, BILITOT, PROT, ALBUMIN in the last 8760 hours. No results for input(s): LIPASE, AMYLASE in the last 8760  hours. No results for input(s): AMMONIA in the last 8760 hours. CBC: Recent Labs    03/06/18 02/08/19 1139  WBC 6.4 5.4  NEUTROABS  --  2.9  HGB 14.7 16.5  HCT 42 51.3  MCV  --  94.8  PLT 317 287   Lipid Panel: Recent Labs    03/06/18  CHOL 162  HDL 53  LDLCALC 85  TRIG 143   Lab Results  Component Value Date  HGBA1C 5.3 03/06/2018    Procedures since last visit: Ct Head Wo Contrast  Result Date: 02/08/2019 CLINICAL DATA:  78 year old male with headache EXAM: CT HEAD WITHOUT CONTRAST TECHNIQUE: Contiguous axial images were obtained from the base of the skull through the vertex without intravenous contrast. COMPARISON:  10/16/2008 FINDINGS: Brain: No acute intracranial hemorrhage. No midline shift or mass effect. Gray-white differentiation maintained. Redemonstration of hypodensity within the bilateral periventricular white matter, most pronounced overlying the right posterior horn. Redemonstration of focal density of the left basal ganglia. Unremarkable appearance of the ventricular system. Vascular: Minimal intracranial atherosclerosis. Skull: No acute fracture.  No aggressive bone lesion identified. Sinuses/Orbits: Is surgical changes of the left globe. Unremarkable appearance of the orbits. No significant paranasal sinus disease. Other: None IMPRESSION: Negative for acute intracranial abnormality. 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   On: 02/08/2019 13:08    Assessment/Plan 1. Uncontrolled hypertension -b/c pt not taking meds as ordered -reviewed extensively with him and instructions provided as follows:   Make sure you take your hydralazine 10mg  THREE times a day.  It does not last a full day.   Continue to take your metoprolol succinate 200mg  once a day. Also take your amlodipine 10mg  one a day. Take your pravastatin and your fenofibrate at night.  He  reports he can see and read the bottles but was not taking the hydralazine as prescribed.  Scores 29/30 on MMSE so that should not be the problem either.    2. Chronic kidney disease, stage 3 (HCC) -Avoid nephrotoxic agents like nsaids, dose adjust renally excreted meds, hydrate. -affected by his bp  3. Body mass index 28.0-28.9, adult -ongoing; not walking like he should lately--encouraged him and educated on importance of exercise and low sodium diet, avoiding alcohol  4. Overweight (BMI 25.0-29.9) -weight loss would help bp but also needs to take meds as prescribed  5. Mixed hyperlipidemia -cont pravastatin and fenofibrate but take at night  Labs/tests ordered:  No orders of the defined types were placed in this encounter.  Next appt:  2 weeks f/u bp (left w/o making apparently)  Mclean Moya L. Arnel Wymer, D.O. [facility] [address], [place] [address] Cell Phone (Mon-Fri 8am-5pm):  [phone] On Call:  [phone] & follow prompts after 5pm & weekends Office Phone:  [phone] Office Fax:  [phone]

## 2019-04-23 ENCOUNTER — Telehealth: Payer: Self-pay

## 2019-04-23 NOTE — Telephone Encounter (Signed)

## 2019-04-26 ENCOUNTER — Ambulatory Visit: Payer: Medicare Other | Admitting: Cardiology

## 2019-05-24 ENCOUNTER — Other Ambulatory Visit: Payer: Self-pay | Admitting: Internal Medicine

## 2019-05-25 ENCOUNTER — Other Ambulatory Visit: Payer: Self-pay | Admitting: Internal Medicine

## 2019-05-25 DIAGNOSIS — I10 Essential (primary) hypertension: Secondary | ICD-10-CM

## 2019-05-26 NOTE — Telephone Encounter (Signed)
Only gave pt #30, pt needs to be seen to follow-up on BP

## 2019-06-19 ENCOUNTER — Other Ambulatory Visit: Payer: Self-pay | Admitting: Internal Medicine

## 2019-06-19 DIAGNOSIS — I10 Essential (primary) hypertension: Secondary | ICD-10-CM

## 2019-09-30 ENCOUNTER — Other Ambulatory Visit: Payer: Self-pay

## 2019-09-30 ENCOUNTER — Encounter: Payer: Self-pay | Admitting: Nurse Practitioner

## 2019-09-30 ENCOUNTER — Ambulatory Visit (INDEPENDENT_AMBULATORY_CARE_PROVIDER_SITE_OTHER): Payer: Medicare Other | Admitting: Nurse Practitioner

## 2019-09-30 VITALS — Ht 67.0 in | Wt 183.0 lb

## 2019-09-30 DIAGNOSIS — Z0000 Encounter for general adult medical examination without abnormal findings: Secondary | ICD-10-CM

## 2019-09-30 NOTE — Progress Notes (Signed)
Subjective:   Douglas Lowery is a 78 y.o. male who presents for Medicare Annual/Subsequent preventive examination.  Review of Systems:   Cardiac Risk Factors include: hypertension;dyslipidemia;male gender;sedentary lifestyle     Objective:    Vitals: Ht 5\' 7"  (1.702 m)   Wt 183 lb (83 kg)   BMI 28.66 kg/m   Body mass index is 28.66 kg/m.  Advanced Directives 02/08/2019 07/20/2018 03/05/2018 09/17/2017 05/19/2017 02/13/2017 01/10/2017  Does Patient Have a Medical Advance Directive? No No No No - No No  Type of Advance Directive - - - - - - -  Does patient want to make changes to medical advance directive? - - - - - No - Patient declined -  Copy of [place] in [place]  Would patient like information on creating a medical advance directive? No - Patient declined - No - Patient declined Yes (ED - Information included in AVS) No - Patient declined - No - Patient declined    Tobacco Social History   Tobacco Use  Smoking Status Never Smoker  Smokeless Tobacco Never Used     Counseling given: Not Answered   Clinical Intake:  Pre-visit preparation completed: Yes  Pain : No/denies pain     BMI - recorded: 28.66 Nutritional Status: BMI 25 -29 Overweight Nutritional Risks: None Diabetes: No  How often do you need to have someone help you when you read instructions, pamphlets, or other written materials from your doctor or pharmacy?: 1 - Never What is the last grade level you completed in school?: college degree        Past Medical History:  Diagnosis Date  . Allergic rhinitis    seasonal  . BPH (benign prostatic hyperplasia)   . Carotid bruit    Carotid US 5/18:  1-39% RICA stenosis; Prominent R subclavian artery, measuring 1.4 cm AP x 1.5 cm TRV. - FU prn  . Cervicalgia 02/28/2016  . Chronic kidney disease   . Hypercholesteremia   . Hypertension    Past Surgical History:  Procedure Laterality Date  . COLONOSCOPY  02/2009   Sessile polyp in the rectum. Otherwise normal examination  . EYE SURGERY  2013   Right eye cataract removed--Dr. Katy Fitch   Family History  Problem Relation Age of Onset  . Pneumonia Father   . Alzheimer's disease Sister   . Hypertension Sister   . Hypertension Sister   . Hypertension Sister   . Heart attack Neg Hx    Social History   Socioeconomic History  . Marital status: Single    Spouse name: Not on file  . Number of children: Not on file  . Years of education: Not on file  . Highest education level: Not on file  Occupational History  . Occupation: Retired  Tobacco Use  . Smoking status: Never Smoker  . Smokeless tobacco: Never Used  Substance and Sexual Activity  . Alcohol use: Yes    Comment: 1 can beer every week  . Drug use: No  . Sexual activity: Yes  Other Topics Concern  . 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   Single   4 children; 6 grandchildren   Social Determinants of Health   Financial Resource Strain:   . Difficulty of Paying Living Expenses: Not on file  Food Insecurity:   . Worried About Charity fundraiser in the Last Year:  Not on file  . Ran Out of Food in the Last Year: Not on file  Transportation Needs:   . Lack of Transportation (Medical): Not on file  . Lack of Transportation (Non-Medical): Not on file  Physical Activity:   . Days of Exercise per Week: Not on file  . Minutes of Exercise per Session: Not on file  Stress:   . Feeling of Stress : Not on file  Social Connections:   . Frequency of Communication with Friends and Family: Not on file  . Frequency of Social Gatherings with Friends and Family: Not on file  . Attends Religious Services: Not on file  . Active Member of Clubs or Organizations: Not on file  . Attends Archivist Meetings: Not on file  . Marital Status: Not on file    Outpatient Encounter Medications as of 09/30/2019  Medication Sig  . aspirin 81  MG chewable tablet Chew 81 mg by mouth daily.  . fenofibrate 54 MG tablet Take one tablet by mouth once daily  . hydrALAZINE (APRESOLINE) 10 MG tablet Take 10 mg by mouth daily.   . metoprolol (TOPROL-XL) 200 MG 24 hr tablet TAKE 1 TABLET BY MOUTH EVERY DAY  . pravastatin (PRAVACHOL) 80 MG tablet Take 1 tablet (80 mg total) by mouth daily.  Marland Kitchen amLODipine (NORVASC) 10 MG tablet Take 1 tablet (10 mg total) by mouth daily.  . [DISCONTINUED] silodosin (RAPAFLO) 4 MG CAPS capsule Take 1 capsule (4 mg total) by mouth daily with breakfast.  . [DISCONTINUED] solifenacin (VESICARE) 5 MG tablet Take 5 mg by mouth daily.   No facility-administered encounter medications on file as of 09/30/2019.    Activities of Daily Living In your present state of health, do you have any difficulty performing the following activities: 09/30/2019  Hearing? N  Vision? N  Difficulty concentrating or making decisions? N  Walking or climbing stairs? N  Dressing or bathing? N  Doing errands, shopping? N  Preparing Food and eating ? N  Using the Toilet? N  In the past six months, have you accidently leaked urine? N  Do you have problems with loss of bowel control? N  Managing your Medications? N  Managing your Finances? N  Housekeeping or managing your Housekeeping? N  Some recent data might be hidden    Patient Care Team: Gayland Curry, DO as PCP - General (Geriatric Medicine) Clent Jacks, MD as Consulting Physician (Ophthalmology) Zadie Rhine Clent Demark, MD as Consulting Physician (Ophthalmology)   Assessment:   This is a routine wellness examination for [place].  Exercise Activities and Dietary recommendations Current Exercise Habits: The patient does not participate in regular exercise at present  Goals    . Increase water intake     Starting 08/19/16, I will attempt to increase my water intake to 6-8 glasses of water per day.        Fall Risk Fall Risk  09/30/2019 02/15/2019 07/20/2018 07/13/2018  03/05/2018  Falls in the past year? 0 0 No No No  Number falls in past yr: 0 0 - - -  Injury with Fall? 0 0 - - -  Risk for fall due to : - - - - -  Risk for fall due to: Comment - - - - -   Is the patient's home free of loose throw rugs in walkways, pet beds, electrical cords, etc?   yes      Grab bars in the bathroom? no      Handrails  on the stairs?   no stairs      Adequate lighting?   yes  Timed Get Up and Go Performed: na  Depression Screen PHQ 2/9 Scores 09/30/2019 09/30/2019 02/15/2019 07/13/2018  PHQ - 2 Score 0 0 0 0    Cognitive Function MMSE - Mini Mental State Exam 09/17/2017 08/19/2016 12/02/2013  Orientation to time 4 5 5   Orientation to Place 5 5 5   Registration 3 3 3   Attention/ Calculation 5 5 5   Recall 3 3 2   Language- name 2 objects 2 2 2   Language- repeat 1 1 1   Language- follow 3 step command 3 2 3   Language- read & follow direction 1 1 1   Write a sentence 1 1 1   Copy design 1 1 1   Total score 29 29 29      6CIT Screen 09/30/2019  What Year? 0 points  What month? 0 points  What time? 0 points  Count back from 20 0 points  Months in reverse 2 points  Repeat phrase 2 points  Total Score 4    Immunization History  Administered Date(s) Administered  . DTaP 10/31/2014  . Influenza, High Dose Seasonal PF 05/19/2017, 07/13/2018  . Influenza,inj,Quad PF,6+ Mos 06/24/2013, 06/09/2014, 07/06/2015, 08/19/2016  . Influenza-Unspecified 06/12/2010, 07/08/2011, 05/01/2019  . Pneumococcal Conjugate-13 11/03/2014  . Pneumococcal Polysaccharide-23 07/31/2009  . Td 09/10/2016  . Zoster 10/31/2014    Qualifies for Shingles Vaccine? Yes recommended.   Screening Tests Health Maintenance  Topic Date Due  . TETANUS/TDAP  09/10/2026  . INFLUENZA VACCINE  Completed  . PNA vac Low Risk Adult  Completed   Cancer Screenings: Lung: Low Dose CT Chest recommended if Age 60-80 years, 30 pack-year currently smoking OR have quit w/in 15years. Patient does not  qualify. Colorectal: aged out  Additional Screenings:  Hepatitis C Screening:na      Plan:     I have personally reviewed and noted the following in the patient's chart:   . Medical and social history . Use of alcohol, tobacco or illicit drugs  . Current medications and supplements . Functional ability and status . Nutritional status . Physical activity . Advanced directives . List of other physicians . Hospitalizations, surgeries, and ER visits in previous 12 months . Vitals . Screenings to include cognitive, depression, and falls . Referrals and appointments  In addition, I have reviewed and discussed with patient certain preventive protocols, quality metrics, and best practice recommendations. A written personalized care plan for preventive services as well as general preventive health recommendations were provided to patient.     Lauree Chandler, NP  09/30/2019

## 2019-09-30 NOTE — Progress Notes (Signed)
Patient ID: Douglas Lowery, male   DOB: January 07, 1941, 78 y.o.   MRN: [id number]  This service is provided via telemedicine  No vital signs collected/recorded due to the encounter was a telemedicine visit.   Location of patient (ex: home, work):  Home  Patient consents to a telephone visit:  Yes  Location of the provider (ex: office, home):  [facility]  Name of any referring provider:  Dr.Tiffany Phelps [place] of all persons participating in the telemedicine service and their role in the encounter:  Patient, Heriberto Antigua, RMA, Sherrie Mustache, NP.  Time spent on call:  8:30

## 2019-09-30 NOTE — Patient Instructions (Signed)
[name] , Thank you for taking time to come for your Medicare Wellness Visit. I appreciate your ongoing commitment to your health goals. Please review the following plan we discussed and let me know if I can assist you in the future.   Screening recommendations/referrals: Colonoscopy aged out Recommended yearly ophthalmology/optometry visit for glaucoma screening and checkup Recommended yearly dental visit for hygiene and checkup  Vaccinations: Influenza vaccine up to date Pneumococcal vaccine up to date Tdap vaccine up to date Shingles vaccine RECOMMENDED- to get at your local pharmacy    Advanced directives: recommended to completed   Conditions/risks identified: medication noncompliance, recommend taking medication as directed to prevent heart attack, stroke or end organ damage.   Next appointment: 1 year  Preventive Care 74 Years and Older, Male Preventive care refers to lifestyle choices and visits with your health care provider that can promote health and wellness. What does preventive care include?  A yearly physical exam. This is also called an annual well check.  Dental exams once or twice a year.  Routine eye exams. Ask your health care provider how often you should have your eyes checked.  Personal lifestyle choices, including:  Daily care of your teeth and gums.  Regular physical activity.  Eating a healthy diet.  Avoiding tobacco and drug use.  Limiting alcohol use.  Practicing safe sex.  Taking low doses of aspirin every day.  Taking vitamin and mineral supplements as recommended by your health care provider. What happens during an annual well check? The services and screenings done by your health care provider during your annual well check will depend on your age, overall health, lifestyle risk factors, and family history of disease. Counseling  Your health care provider may ask you questions about your:  Alcohol use.  Tobacco use.  Drug  use.  Emotional well-being.  Home and relationship well-being.  Sexual activity.  Eating habits.  History of falls.  Memory and ability to understand (cognition).  Work and work Statistician. Screening  You may have the following tests or measurements:  Height, weight, and BMI.  Blood pressure.  Lipid and cholesterol levels. These may be checked every 5 years, or more frequently if you are over 16 years old.  Skin check.  Lung cancer screening. You may have this screening every year starting at age 43 if you have a 30-pack-year history of smoking and currently smoke or have quit within the past 15 years.  Fecal occult blood test (FOBT) of the stool. You may have this test every year starting at age 40.  Flexible sigmoidoscopy or colonoscopy. You may have a sigmoidoscopy every 5 years or a colonoscopy every 10 years starting at age 55.  Prostate cancer screening. Recommendations will vary depending on your family history and other risks.  Hepatitis C blood test.  Hepatitis B blood test.  Sexually transmitted disease (STD) testing.  Diabetes screening. This is done by checking your blood sugar (glucose) after you have not eaten for a while (fasting). You may have this done every 1-3 years.  Abdominal aortic aneurysm (AAA) screening. You may need this if you are a current or former smoker.  Osteoporosis. You may be screened starting at age 45 if you are at high risk. Talk with your health care provider about your test results, treatment options, and if necessary, the need for more tests. Vaccines  Your health care provider may recommend certain vaccines, such as:  Influenza vaccine. This is recommended every year.  Tetanus, diphtheria,  and acellular pertussis (Tdap, Td) vaccine. You may need a Td booster every 10 years.  Zoster vaccine. You may need this after age 81.  Pneumococcal 13-valent conjugate (PCV13) vaccine. One dose is recommended after age  29.  Pneumococcal polysaccharide (PPSV23) vaccine. One dose is recommended after age 35. Talk to your health care provider about which screenings and vaccines you need and how often you need them. This information is not intended to replace advice given to you by your health care provider. Make sure you discuss any questions you have with your health care provider. Document Released: 10/13/2015 Document Revised: 06/05/2016 Document Reviewed: 07/18/2015 Elsevier Interactive Patient Education  2017 [employer] Prevention in the Home Falls can cause injuries. They can happen to people of all ages. There are many things you can do to make your home safe and to help prevent falls. What can I do on the outside of my home?  Regularly fix the edges of walkways and driveways and fix any cracks.  Remove anything that might make you trip as you walk through a door, such as a raised step or threshold.  Trim any bushes or trees on the path to your home.  Use bright outdoor lighting.  Clear any walking paths of anything that might make someone trip, such as rocks or tools.  Regularly check to see if handrails are loose or broken. Make sure that both sides of any steps have handrails.  Any raised decks and porches should have guardrails on the edges.  Have any leaves, snow, or ice cleared regularly.  Use sand or salt on walking paths during winter.  Clean up any spills in your garage right away. This includes oil or grease spills. What can I do in the bathroom?  Use night lights.  Install grab bars by the toilet and in the tub and shower. Do not use towel bars as grab bars.  Use non-skid mats or decals in the tub or shower.  If you need to sit down in the shower, use a plastic, non-slip stool.  Keep the floor dry. Clean up any water that spills on the floor as soon as it happens.  Remove soap buildup in the tub or shower regularly.  Attach bath mats securely with double-sided  non-slip rug tape.  Do not have throw rugs and other things on the floor that can make you trip. What can I do in the bedroom?  Use night lights.  Make sure that you have a light by your bed that is easy to reach.  Do not use any sheets or blankets that are too big for your bed. They should not hang down onto the floor.  Have a firm chair that has side arms. You can use this for support while you get dressed.  Do not have throw rugs and other things on the floor that can make you trip. What can I do in the kitchen?  Clean up any spills right away.  Avoid walking on wet floors.  Keep items that you use a lot in easy-to-reach places.  If you need to reach something above you, use a strong step stool that has a grab bar.  Keep electrical cords out of the way.  Do not use floor polish or wax that makes floors slippery. If you must use wax, use non-skid floor wax.  Do not have throw rugs and other things on the floor that can make you trip. What can I do with my  stairs?  Do not leave any items on the stairs.  Make sure that there are handrails on both sides of the stairs and use them. Fix handrails that are broken or loose. Make sure that handrails are as long as the stairways.  Check any carpeting to make sure that it is firmly attached to the stairs. Fix any carpet that is loose or worn.  Avoid having throw rugs at the top or bottom of the stairs. If you do have throw rugs, attach them to the floor with carpet tape.  Make sure that you have a light switch at the top of the stairs and the bottom of the stairs. If you do not have them, ask someone to add them for you. What else can I do to help prevent falls?  Wear shoes that:  Do not have high heels.  Have rubber bottoms.  Are comfortable and fit you well.  Are closed at the toe. Do not wear sandals.  If you use a stepladder:  Make sure that it is fully opened. Do not climb a closed stepladder.  Make sure that both  sides of the stepladder are locked into place.  Ask someone to hold it for you, if possible.  Clearly mark and make sure that you can see:  Any grab bars or handrails.  First and last steps.  Where the edge of each step is.  Use tools that help you move around (mobility aids) if they are needed. These include:  Canes.  Walkers.  Scooters.  Crutches.  Turn on the lights when you go into a dark area. Replace any light bulbs as soon as they burn out.  Set up your furniture so you have a clear path. Avoid moving your furniture around.  If any of your floors are uneven, fix them.  If there are any pets around you, be aware of where they are.  Review your medicines with your doctor. Some medicines can make you feel dizzy. This can increase your chance of falling. Ask your doctor what other things that you can do to help prevent falls. This information is not intended to replace advice given to you by your health care provider. Make sure you discuss any questions you have with your health care provider. Document Released: 07/13/2009 Document Revised: 02/22/2016 Document Reviewed: 10/21/2014 Elsevier Interactive Patient Education  2017 [employer].

## 2019-10-05 DIAGNOSIS — R3915 Urgency of urination: Secondary | ICD-10-CM | POA: Diagnosis not present

## 2019-10-15 ENCOUNTER — Other Ambulatory Visit: Payer: Self-pay

## 2019-10-15 ENCOUNTER — Ambulatory Visit: Payer: Medicare Other | Admitting: Internal Medicine

## 2019-11-24 ENCOUNTER — Other Ambulatory Visit: Payer: Self-pay | Admitting: Internal Medicine

## 2019-11-24 DIAGNOSIS — I10 Essential (primary) hypertension: Secondary | ICD-10-CM

## 2020-04-20 DIAGNOSIS — R3915 Urgency of urination: Secondary | ICD-10-CM | POA: Diagnosis not present

## 2020-04-24 ENCOUNTER — Other Ambulatory Visit: Payer: Self-pay | Admitting: Internal Medicine

## 2020-04-24 DIAGNOSIS — I10 Essential (primary) hypertension: Secondary | ICD-10-CM

## 2020-04-24 NOTE — Telephone Encounter (Addendum)
Patient has not been seen in the [facility] office or had labs since 01/2019. The number listed for him is not a working number. I called the number listed for his sister, the number goes straight to VM without identifying the number. He has a number listed for a friend, but there is no answer. He does not give permission for anyone except him to have [facility] so no messages were left. Refill request has been refused.

## 2020-04-25 ENCOUNTER — Other Ambulatory Visit: Payer: Self-pay

## 2020-04-25 MED ORDER — FENOFIBRATE 54 MG PO TABS: ORAL_TABLET | ORAL | 0 refills | Status: DC

## 2020-04-26 ENCOUNTER — Other Ambulatory Visit: Payer: Self-pay | Admitting: Internal Medicine

## 2020-04-26 DIAGNOSIS — I10 Essential (primary) hypertension: Secondary | ICD-10-CM

## 2020-05-02 ENCOUNTER — Telehealth: Payer: Self-pay | Admitting: Nurse Practitioner

## 2020-05-10 ENCOUNTER — Telehealth: Payer: Self-pay | Admitting: Nurse Practitioner

## 2020-05-18 ENCOUNTER — Other Ambulatory Visit: Payer: Self-pay | Admitting: Internal Medicine

## 2020-05-18 NOTE — Telephone Encounter (Signed)
Dr.Reed please advise if ok to give refill  Last in person OV 02/15/2019, no recent labs  Patient had telephone visit with Sherrie Mustache,, NP on 09/29/2020

## 2020-05-18 NOTE — Telephone Encounter (Signed)
Patient is overdue for an appointment  I called [phone], number not a working number.  I called patients sister Abram Sander 2 at [phone], number not a working number   I called patients friend Cecille Rubin and left message requesting a return call

## 2020-05-18 NOTE — Telephone Encounter (Signed)
Let's fill once more and send him a letter.  Maybe he'll call us back.  I suspect he's going elsewhere.

## 2020-05-24 ENCOUNTER — Emergency Department (HOSPITAL_COMMUNITY)
Admission: EM | Admit: 2020-05-24 | Discharge: 2020-05-24 | Disposition: A | Discharge status: Home / Self Care | Payer: Medicare Other | Attending: Emergency Medicine | Admitting: Emergency Medicine

## 2020-05-24 ENCOUNTER — Encounter (HOSPITAL_COMMUNITY): Payer: Self-pay | Admitting: Certified Registered Nurse Anesthetist

## 2020-05-24 ENCOUNTER — Encounter (HOSPITAL_COMMUNITY): Payer: Self-pay | Admitting: Emergency Medicine

## 2020-05-24 ENCOUNTER — Emergency Department (HOSPITAL_COMMUNITY): Payer: Medicare Other

## 2020-05-24 DIAGNOSIS — Z7982 Long term (current) use of aspirin: Secondary | ICD-10-CM | POA: Insufficient documentation

## 2020-05-24 DIAGNOSIS — I10 Essential (primary) hypertension: Secondary | ICD-10-CM | POA: Diagnosis not present

## 2020-05-24 DIAGNOSIS — R4781 Slurred speech: Secondary | ICD-10-CM | POA: Diagnosis not present

## 2020-05-24 DIAGNOSIS — Z20822 Contact with and (suspected) exposure to covid-19: Secondary | ICD-10-CM | POA: Diagnosis not present

## 2020-05-24 DIAGNOSIS — I129 Hypertensive chronic kidney disease with stage 1 through stage 4 chronic kidney disease, or unspecified chronic kidney disease: Secondary | ICD-10-CM | POA: Diagnosis not present

## 2020-05-24 DIAGNOSIS — I499 Cardiac arrhythmia, unspecified: Secondary | ICD-10-CM | POA: Diagnosis not present

## 2020-05-24 DIAGNOSIS — I69351 Hemiplegia and hemiparesis following cerebral infarction affecting right dominant side: Secondary | ICD-10-CM | POA: Diagnosis not present

## 2020-05-24 DIAGNOSIS — N3000 Acute cystitis without hematuria: Secondary | ICD-10-CM | POA: Diagnosis not present

## 2020-05-24 DIAGNOSIS — R29818 Other symptoms and signs involving the nervous system: Secondary | ICD-10-CM | POA: Diagnosis not present

## 2020-05-24 DIAGNOSIS — R1310 Dysphagia, unspecified: Secondary | ICD-10-CM | POA: Diagnosis not present

## 2020-05-24 DIAGNOSIS — N281 Cyst of kidney, acquired: Secondary | ICD-10-CM | POA: Diagnosis not present

## 2020-05-24 DIAGNOSIS — I6350 Cerebral infarction due to unspecified occlusion or stenosis of unspecified cerebral artery: Secondary | ICD-10-CM | POA: Diagnosis not present

## 2020-05-24 DIAGNOSIS — I639 Cerebral infarction, unspecified: Secondary | ICD-10-CM | POA: Diagnosis not present

## 2020-05-24 DIAGNOSIS — G459 Transient cerebral ischemic attack, unspecified: Secondary | ICD-10-CM | POA: Diagnosis not present

## 2020-05-24 DIAGNOSIS — I6623 Occlusion and stenosis of bilateral posterior cerebral arteries: Secondary | ICD-10-CM | POA: Diagnosis not present

## 2020-05-24 DIAGNOSIS — R1312 Dysphagia, oropharyngeal phase: Secondary | ICD-10-CM | POA: Diagnosis not present

## 2020-05-24 DIAGNOSIS — Z79899 Other long term (current) drug therapy: Secondary | ICD-10-CM | POA: Diagnosis not present

## 2020-05-24 DIAGNOSIS — K592 Neurogenic bowel, not elsewhere classified: Secondary | ICD-10-CM | POA: Diagnosis not present

## 2020-05-24 DIAGNOSIS — E538 Deficiency of other specified B group vitamins: Secondary | ICD-10-CM | POA: Diagnosis not present

## 2020-05-24 DIAGNOSIS — Z7409 Other reduced mobility: Secondary | ICD-10-CM | POA: Diagnosis not present

## 2020-05-24 DIAGNOSIS — Z743 Need for continuous supervision: Secondary | ICD-10-CM | POA: Diagnosis not present

## 2020-05-24 DIAGNOSIS — R29710 NIHSS score 10: Secondary | ICD-10-CM | POA: Diagnosis not present

## 2020-05-24 DIAGNOSIS — R531 Weakness: Secondary | ICD-10-CM | POA: Diagnosis not present

## 2020-05-24 DIAGNOSIS — R29810 Facial weakness: Secondary | ICD-10-CM | POA: Diagnosis not present

## 2020-05-24 DIAGNOSIS — I69319 Unspecified symptoms and signs involving cognitive functions following cerebral infarction: Secondary | ICD-10-CM | POA: Diagnosis not present

## 2020-05-24 DIAGNOSIS — B370 Candidal stomatitis: Secondary | ICD-10-CM | POA: Diagnosis not present

## 2020-05-24 DIAGNOSIS — E559 Vitamin D deficiency, unspecified: Secondary | ICD-10-CM | POA: Diagnosis not present

## 2020-05-24 DIAGNOSIS — I69398 Other sequelae of cerebral infarction: Secondary | ICD-10-CM | POA: Diagnosis not present

## 2020-05-24 DIAGNOSIS — R279 Unspecified lack of coordination: Secondary | ICD-10-CM | POA: Diagnosis not present

## 2020-05-24 DIAGNOSIS — R000 Tachycardia, unspecified: Secondary | ICD-10-CM | POA: Diagnosis not present

## 2020-05-24 DIAGNOSIS — G8101 Flaccid hemiplegia affecting right dominant side: Secondary | ICD-10-CM | POA: Diagnosis not present

## 2020-05-24 DIAGNOSIS — I491 Atrial premature depolarization: Secondary | ICD-10-CM | POA: Diagnosis not present

## 2020-05-24 DIAGNOSIS — I351 Nonrheumatic aortic (valve) insufficiency: Secondary | ICD-10-CM | POA: Diagnosis not present

## 2020-05-24 DIAGNOSIS — I69322 Dysarthria following cerebral infarction: Secondary | ICD-10-CM | POA: Diagnosis not present

## 2020-05-24 DIAGNOSIS — I6781 Acute cerebrovascular insufficiency: Secondary | ICD-10-CM | POA: Diagnosis not present

## 2020-05-24 DIAGNOSIS — R29898 Other symptoms and signs involving the musculoskeletal system: Secondary | ICD-10-CM | POA: Diagnosis not present

## 2020-05-24 DIAGNOSIS — N182 Chronic kidney disease, stage 2 (mild): Secondary | ICD-10-CM | POA: Diagnosis not present

## 2020-05-24 DIAGNOSIS — N319 Neuromuscular dysfunction of bladder, unspecified: Secondary | ICD-10-CM | POA: Diagnosis not present

## 2020-05-24 DIAGNOSIS — R471 Dysarthria and anarthria: Secondary | ICD-10-CM | POA: Diagnosis not present

## 2020-05-24 DIAGNOSIS — I469 Cardiac arrest, cause unspecified: Secondary | ICD-10-CM | POA: Diagnosis not present

## 2020-05-24 DIAGNOSIS — R05 Cough: Secondary | ICD-10-CM | POA: Diagnosis not present

## 2020-05-24 DIAGNOSIS — I69320 Aphasia following cerebral infarction: Secondary | ICD-10-CM | POA: Diagnosis not present

## 2020-05-24 DIAGNOSIS — N183 Chronic kidney disease, stage 3 unspecified: Secondary | ICD-10-CM | POA: Diagnosis not present

## 2020-05-24 DIAGNOSIS — Z466 Encounter for fitting and adjustment of urinary device: Secondary | ICD-10-CM | POA: Diagnosis not present

## 2020-05-24 DIAGNOSIS — I69391 Dysphagia following cerebral infarction: Secondary | ICD-10-CM | POA: Diagnosis not present

## 2020-05-24 DIAGNOSIS — I6389 Other cerebral infarction: Secondary | ICD-10-CM | POA: Diagnosis not present

## 2020-05-24 DIAGNOSIS — E785 Hyperlipidemia, unspecified: Secondary | ICD-10-CM | POA: Diagnosis not present

## 2020-05-24 DIAGNOSIS — M21371 Foot drop, right foot: Secondary | ICD-10-CM | POA: Diagnosis not present

## 2020-05-24 DIAGNOSIS — R339 Retention of urine, unspecified: Secondary | ICD-10-CM | POA: Diagnosis not present

## 2020-05-24 DIAGNOSIS — G8191 Hemiplegia, unspecified affecting right dominant side: Secondary | ICD-10-CM | POA: Diagnosis not present

## 2020-05-24 DIAGNOSIS — K5900 Constipation, unspecified: Secondary | ICD-10-CM | POA: Diagnosis not present

## 2020-05-24 LAB — I-STAT CHEM 8, ED
BUN: 19 mg/dL (ref 8–23)
Calcium, Ion: 1.02 mmol/L — ABNORMAL LOW (ref 1.15–1.40)
Chloride: 105 mmol/L (ref 98–111)
Creatinine, Ser: 1 mg/dL (ref 0.61–1.24)
Glucose, Bld: 167 mg/dL — ABNORMAL HIGH (ref 70–99)
HCT: 54 % — ABNORMAL HIGH (ref 39.0–52.0)
Hemoglobin: 18.4 g/dL — ABNORMAL HIGH (ref 13.0–17.0)
Potassium: 5.6 mmol/L — ABNORMAL HIGH (ref 3.5–5.1)
Sodium: 138 mmol/L (ref 135–145)
TCO2: 26 mmol/L (ref 22–32)

## 2020-05-24 LAB — CBC
HCT: 54.3 % — ABNORMAL HIGH (ref 39.0–52.0)
Hemoglobin: 17.4 g/dL — ABNORMAL HIGH (ref 13.0–17.0)
MCH: 31.2 pg (ref 26.0–34.0)
MCHC: 32 g/dL (ref 30.0–36.0)
MCV: 97.5 fL (ref 80.0–100.0)
Platelets: 272 10*3/uL (ref 150–400)
RBC: 5.57 MIL/uL (ref 4.22–5.81)
RDW: 12.5 % (ref 11.5–15.5)
WBC: 7.7 10*3/uL (ref 4.0–10.5)
nRBC: 0 % (ref 0.0–0.2)

## 2020-05-24 LAB — DIFFERENTIAL
Abs Immature Granulocytes: 0.03 10*3/uL (ref 0.00–0.07)
Basophils Absolute: 0 10*3/uL (ref 0.0–0.1)
Basophils Relative: 1 %
Eosinophils Absolute: 0 10*3/uL (ref 0.0–0.5)
Eosinophils Relative: 0 %
Immature Granulocytes: 0 %
Lymphocytes Relative: 11 %
Lymphs Abs: 0.9 10*3/uL (ref 0.7–4.0)
Monocytes Absolute: 0.4 10*3/uL (ref 0.1–1.0)
Monocytes Relative: 5 %
Neutro Abs: 6.3 10*3/uL (ref 1.7–7.7)
Neutrophils Relative %: 83 %

## 2020-05-24 LAB — APTT: aPTT: 29 seconds (ref 24–36)

## 2020-05-24 LAB — PROTIME-INR
INR: 1 (ref 0.8–1.2)
Prothrombin Time: 13.1 seconds (ref 11.4–15.2)

## 2020-05-24 LAB — COMPREHENSIVE METABOLIC PANEL
ALT: 18 U/L (ref 0–44)
AST: 22 U/L (ref 15–41)
Albumin: 4.1 g/dL (ref 3.5–5.0)
Alkaline Phosphatase: 119 U/L (ref 38–126)
Anion gap: 13 (ref 5–15)
BUN: 12 mg/dL (ref 8–23)
CO2: 21 mmol/L — ABNORMAL LOW (ref 22–32)
Calcium: 10.1 mg/dL (ref 8.9–10.3)
Chloride: 105 mmol/L (ref 98–111)
Creatinine, Ser: 1.22 mg/dL (ref 0.61–1.24)
GFR calc Af Amer: >60 mL/min (ref >60)
GFR calc non Af Amer: 56 mL/min — ABNORMAL LOW (ref >60)
Glucose, Bld: 169 mg/dL — ABNORMAL HIGH (ref 70–99)
Potassium: 4.1 mmol/L (ref 3.5–5.1)
Sodium: 139 mmol/L (ref 135–145)
Total Bilirubin: 1.1 mg/dL (ref 0.3–1.2)
Total Protein: 8.4 g/dL — ABNORMAL HIGH (ref 6.5–8.1)

## 2020-05-24 LAB — CBG MONITORING, ED: Glucose-Capillary: 170 mg/dL — ABNORMAL HIGH (ref 70–99)

## 2020-05-24 LAB — SARS CORONAVIRUS 2 BY RT PCR (HOSPITAL ORDER, PERFORMED IN ~~LOC~~ HOSPITAL LAB): SARS Coronavirus 2: NEGATIVE

## 2020-05-24 SURGERY — IR WITH ANESTHESIA
Anesthesia: Choice

## 2020-05-24 MED ORDER — SODIUM CHLORIDE 0.9% FLUSH: 3.0000 mL | Freq: Once | INTRAVENOUS | Status: DC

## 2020-05-24 MED ORDER — IOHEXOL 350 MG/ML SOLN
100.0000 mL | Freq: Once | INTRAVENOUS | Status: AC | PRN
  Administered 2020-05-24: 100 mL via INTRAVENOUS

## 2020-05-24 NOTE — ED Provider Notes (Signed)
[place] EMERGENCY DEPARTMENT Provider Note   CSN: [id number] Arrival date & time: 05/24/20  1018  An emergency department physician performed an initial assessment on this suspected stroke patient at 1024.  History No chief complaint on file.   Douglas Lowery is a 79 y.o. male.  The history is provided by the patient, the EMS personnel and medical records. No language interpreter was used.   Douglas Lowery is a 79 y.o. male who presents to the Emergency Department complaining of stroke symptoms. Level V caveat due to acuity of condition. History is provided by EMS. He was last known to be at his baseline at 1600 yesterday. Family came into the home today and he was found on the floor. He states that he fell when it was dark out.  He has new right sided weakness    Past Medical History:  Diagnosis Date  . Allergic rhinitis    seasonal  . BPH (benign prostatic hyperplasia)   . Carotid bruit    Carotid US 5/18:  1-39% RICA stenosis; Prominent R subclavian artery, measuring 1.4 cm AP x 1.5 cm TRV. - FU prn  . Cervicalgia 02/28/2016  . Chronic kidney disease   . Hypercholesteremia   . Hypertension     Patient Active Problem List   Diagnosis Date Noted  . Hyperglycemia 01/10/2017  . Cervicalgia 02/28/2016  . Uncontrolled hypertension 12/28/2015  . Chronic rhinitis 12/06/2015  . Hypertrophy of nasal turbinates 12/06/2015  . Benign growth on outer layer of left eye 10/28/2015  . 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 Essential hypertension, benign 12/02/2013  . Mixed hyperlipidemia 12/02/2013  . Allergic rhinitis due to pollen 12/21/2012  . BPH (benign prostatic hyperplasia) 12/21/2012  . Cerumen impaction 12/21/2012  . Chronic kidney disease, stage 3     Past Surgical History:  Procedure Laterality Date  . COLONOSCOPY  02/2009   Sessile polyp in the rectum. Otherwise normal examination  . EYE SURGERY  2013   Right eye cataract removed--Dr.  Katy Fitch       Family History  Problem Relation Age of Onset  . Pneumonia Father   . Alzheimer's disease Sister   . Hypertension Sister   . Hypertension Sister   . Hypertension Sister   . Heart attack Neg Hx     Social History   Tobacco Use  . Smoking status: Never Smoker  . Smokeless tobacco: Never Used  Vaping Use  . Vaping Use: Never used  Substance Use Topics  . Alcohol use: Yes    Comment: 1 can beer every week  . Drug use: No    Home Medications Prior to Admission medications   Medication Sig Start Date End Date Taking? Authorizing Provider  amLODipine (NORVASC) 10 MG tablet Take 1 tablet (10 mg total) by mouth daily. 07/16/18 07/16/19  Jettie Booze, MD  aspirin 81 MG chewable tablet Chew 81 mg by mouth daily.    [provider]  fenofibrate 54 MG tablet TAKE ONE TABLET BY MOUTH ONCE DAILY PATIENT NEEDS TO MAKE APPOINTMENT 05/19/20   Reed, Tiffany L, DO  hydrALAZINE (APRESOLINE) 10 MG tablet Take 10 mg by mouth daily.  01/05/19   [provider]  metoprolol (TOPROL-XL) 200 MG 24 hr tablet TAKE 1 TABLET BY MOUTH EVERY DAY 06/21/19   Reed, Tiffany L, DO  pravastatin (PRAVACHOL) 80 MG tablet Take 1 tablet (80 mg total) by mouth daily. 05/05/18   Consuelo Pandy, PA-C    Allergies  Lisinopril  Review of Systems   Review of Systems  All other systems reviewed and are negative.   Physical Exam Updated Vital Signs BP (!) 202/106   Pulse 67   Temp 97.9 F (36.6 C)   Resp 15   SpO2 94%   Physical Exam Vitals and nursing note reviewed.  Constitutional:      Appearance: He is well-developed.  HENT:     Head: Normocephalic and atraumatic.  Cardiovascular:     Rate and Rhythm: Normal rate and regular rhythm.     Heart sounds: No murmur heard.   Pulmonary:     Effort: Pulmonary effort is normal. No respiratory distress.     Breath sounds: Normal breath sounds.  Abdominal:     Palpations: Abdomen is soft.     Tenderness: There is  no abdominal tenderness. There is no guarding or rebound.  Musculoskeletal:        General: No tenderness.  Skin:    General: Skin is warm and dry.  Neurological:     Mental Status: He is alert.     Comments: Awake and alert.  RUE/RLE 2/5.  LUE/LLE 5/5.    Psychiatric:        Behavior: Behavior normal.     ED Results / Procedures / Treatments   Labs (all labs ordered are listed, but only abnormal results are displayed) Labs Reviewed  CBC - Abnormal; Notable for the following components:      Result Value   Hemoglobin 17.4 (*)    HCT 54.3 (*)    All other components within normal limits  COMPREHENSIVE METABOLIC PANEL - Abnormal; Notable for the following components:   CO2 21 (*)    Glucose, Bld 169 (*)    Total Protein 8.4 (*)    GFR calc non Af Amer 56 (*)    All other components within normal limits  I-STAT CHEM 8, ED - Abnormal; Notable for the following components:   Potassium 5.6 (*)    Glucose, Bld 167 (*)    Calcium, Ion 1.02 (*)    Hemoglobin 18.4 (*)    HCT 54.0 (*)    All other components within normal limits  CBG MONITORING, ED - Abnormal; Notable for the following components:   Glucose-Capillary 170 (*)    All other components within normal limits  SARS CORONAVIRUS 2 BY RT PCR (HOSPITAL ORDER, [facility])  PROTIME-INR  APTT  DIFFERENTIAL    EKG EKG Interpretation  Date/Time:  Wednesday May 24 2020 11:28:58 EDT Ventricular Rate:  75 PR Interval:    QRS Duration: 87 QT Interval:  384 QTC Calculation: 429 R Axis:   79 Text Interpretation: Sinus rhythm Consider left atrial enlargement LVH with secondary repolarization abnormality ST depr, consider ischemia, inferior leads Anterior ST elevation, probably due to LVH Artifact in lead(s) I II III aVR aVL V2 similar when compared to priors Confirmed by Quintella Reichert [phone]) on 05/24/2020 11:38:44 AM   Radiology CT CEREBRAL PERFUSION W CONTRAST  Result Date:  05/24/2020 CLINICAL DATA:  Right-sided weakness, facial droop, slurred speech. EXAM: CT ANGIOGRAPHY HEAD AND NECK CT PERFUSION BRAIN TECHNIQUE: Multidetector CT imaging of the head and neck was performed using the standard protocol during bolus administration of intravenous contrast. Multiplanar CT image reconstructions and MIPs were obtained to evaluate the vascular anatomy. Carotid stenosis measurements (when applicable) are obtained utilizing NASCET criteria, using the distal internal carotid diameter as the denominator. Multiphase CT imaging of the brain  was performed following IV bolus contrast injection. Subsequent parametric perfusion maps were calculated using RAPID software. CONTRAST:  164mL OMNIPAQUE IOHEXOL 350 MG/ML SOLN COMPARISON:  CT head 05/24/2020 FINDINGS: CTA NECK FINDINGS Aortic arch: Minimal atherosclerotic disease aortic arch. Bovine branching pattern of the arch. Proximal great vessels patent without stenosis. Right carotid system: Right carotid widely patent without stenosis. No significant atherosclerotic disease or dissection Left carotid system: Left carotid bifurcation occurs the proximal left common carotid artery just above the left clavicle. Left internal and external carotid arteries are widely patent without significant stenosis. Left internal carotid artery is relatively small diffusely due to hypoplastic left A1 segment. Vertebral arteries: Both vertebral arteries patent to the basilar without stenosis. Skeleton: Cervical spondylosis at multiple levels. No acute skeletal abnormality. Other neck: Negative for mass or adenopathy in the neck. Upper chest: Lung apices clear bilaterally. Review of the MIP images confirms the above findings CTA HEAD FINDINGS Anterior circulation: Cavernous carotid patent bilaterally without stenosis or significant atherosclerotic disease. Anterior and middle cerebral arteries patent bilaterally without stenosis or large vessel occlusion. Hypoplastic left  A1 segment. Both anterior cerebral arteries supplied from the right. Posterior circulation: Both vertebral arteries patent to the basilar. PICA not visualized. Basilar patent. Moderate stenosis in the posterior cerebral artery bilaterally. Superior cerebellar arteries patent. AICA patent bilaterally. Venous sinuses: Limited venous contrast due to arterial phase scanning. Anatomic variants: None Review of the MIP images confirms the above findings CT Brain Perfusion Findings: ASPECTS: 10 CBF (<30%) Volume: 46mL Perfusion (Tmax>6.0s) volume: 571mL Mismatch Volume: 577mL Infarction Location:CT perfusion demonstrates delayed perfusion throughout the cerebral cortex bilaterally involving the frontal parietal and temporal lobes. This is symmetric and most likely due to misregistration. IMPRESSION: 1. No significant carotid or vertebral artery stenosis in the neck. Note is made of an early bifurcation of left common carotid artery at the level the clavicle. 2. Negative for intracranial large vessel occlusion. 3. 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 4. CT perfusion shows diffuse cortical perfusion delay bilaterally felt to be artifact from misregistration. Electronically Signed   By: Franchot Gallo M.D.   On: 05/24/2020 11:23   CT HEAD CODE STROKE WO CONTRAST  Result Date: 05/24/2020 CLINICAL DATA:  Code stroke. Acute neuro deficit. Right-sided deficit. Slurred speech and facial droop EXAM: CT HEAD WITHOUT CONTRAST TECHNIQUE: Contiguous axial images were obtained from the base of the skull through the vertex without intravenous contrast. COMPARISON:  CT head 02/08/2019 FINDINGS: Brain: Chronic infarct left basal ganglia unchanged. Moderate to extensive chronic microvascular ischemic changes in the white matter also unchanged. Chronic infarct left thalamus unchanged. Hypodensity right pons not definitely seen previously but appears chronic. Hypodensity left medulla. Based on the sagittal images,  this is likely an area of artifact from beam hardening. Acute infarct less likely. Negative for hemorrhage or mass.  Negative for hydrocephalus. Vascular: Arteries at the base of brain are diffusely high density. No evidence of acute thrombosis. Skull: Negative Sinuses/Orbits: Paranasal sinuses clear. Left ocular valve. No orbital mass. Bilateral cataract extraction. Other: None ASPECTS ([place] Stroke Program Early CT Score) - Ganglionic level infarction (caudate, lentiform nuclei, internal capsule, insula, M1-M3 cortex): 7 - Supraganglionic infarction (M4-M6 cortex): 3 Total score (0-10 with 10 being normal): 10 IMPRESSION: 1. Extensive chronic ischemic changes. Right pontine infarct appears chronic but not seen previously. No intracranial hemorrhage 2. Hypodensity left medulla most likely beam hardening artifact. Correlate with symptoms. 3. ASPECTS is 10 4. Preliminary report texted to Dr. Cheral Marker Electronically Signed  By: Franchot Gallo M.D.   On: 05/24/2020 11:04   CT ANGIO HEAD CODE STROKE  Result Date: 05/24/2020 CLINICAL DATA:  Right-sided weakness, facial droop, slurred speech. EXAM: CT ANGIOGRAPHY HEAD AND NECK CT PERFUSION BRAIN TECHNIQUE: Multidetector CT imaging of the head and neck was performed using the standard protocol during bolus administration of intravenous contrast. Multiplanar CT image reconstructions and MIPs were obtained to evaluate the vascular anatomy. Carotid stenosis measurements (when applicable) are obtained utilizing NASCET criteria, using the distal internal carotid diameter as the denominator. Multiphase CT imaging of the brain was performed following IV bolus contrast injection. Subsequent parametric perfusion maps were calculated using RAPID software. CONTRAST:  133mL OMNIPAQUE IOHEXOL 350 MG/ML SOLN COMPARISON:  CT head 05/24/2020 FINDINGS: CTA NECK FINDINGS Aortic arch: Minimal atherosclerotic disease aortic arch. Bovine branching pattern of the arch. Proximal great  vessels patent without stenosis. Right carotid system: Right carotid widely patent without stenosis. No significant atherosclerotic disease or dissection Left carotid system: Left carotid bifurcation occurs the proximal left common carotid artery just above the left clavicle. Left internal and external carotid arteries are widely patent without significant stenosis. Left internal carotid artery is relatively small diffusely due to hypoplastic left A1 segment. Vertebral arteries: Both vertebral arteries patent to the basilar without stenosis. Skeleton: Cervical spondylosis at multiple levels. No acute skeletal abnormality. Other neck: Negative for mass or adenopathy in the neck. Upper chest: Lung apices clear bilaterally. Review of the MIP images confirms the above findings CTA HEAD FINDINGS Anterior circulation: Cavernous carotid patent bilaterally without stenosis or significant atherosclerotic disease. Anterior and middle cerebral arteries patent bilaterally without stenosis or large vessel occlusion. Hypoplastic left A1 segment. Both anterior cerebral arteries supplied from the right. Posterior circulation: Both vertebral arteries patent to the basilar. PICA not visualized. Basilar patent. Moderate stenosis in the posterior cerebral artery bilaterally. Superior cerebellar arteries patent. AICA patent bilaterally. Venous sinuses: Limited venous contrast due to arterial phase scanning. Anatomic variants: None Review of the MIP images confirms the above findings CT Brain Perfusion Findings: ASPECTS: 10 CBF (<30%) Volume: 64mL Perfusion (Tmax>6.0s) volume: 572mL Mismatch Volume: 572mL Infarction Location:CT perfusion demonstrates delayed perfusion throughout the cerebral cortex bilaterally involving the frontal parietal and temporal lobes. This is symmetric and most likely due to misregistration. IMPRESSION: 1. No significant carotid or vertebral artery stenosis in the neck. Note is made of an early bifurcation of  left common carotid artery at the level the clavicle. 2. Negative for intracranial large vessel occlusion. 3. 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 4. CT perfusion shows diffuse cortical perfusion delay bilaterally felt to be artifact from misregistration. Electronically Signed   By: Franchot Gallo M.D.   On: 05/24/2020 11:23   CT ANGIO NECK CODE STROKE  Result Date: 05/24/2020 CLINICAL DATA:  Right-sided weakness, facial droop, slurred speech. EXAM: CT ANGIOGRAPHY HEAD AND NECK CT PERFUSION BRAIN TECHNIQUE: Multidetector CT imaging of the head and neck was performed using the standard protocol during bolus administration of intravenous contrast. Multiplanar CT image reconstructions and MIPs were obtained to evaluate the vascular anatomy. Carotid stenosis measurements (when applicable) are obtained utilizing NASCET criteria, using the distal internal carotid diameter as the denominator. Multiphase CT imaging of the brain was performed following IV bolus contrast injection. Subsequent parametric perfusion maps were calculated using RAPID software. CONTRAST:  116mL OMNIPAQUE IOHEXOL 350 MG/ML SOLN COMPARISON:  CT head 05/24/2020 FINDINGS: CTA NECK FINDINGS Aortic arch: Minimal atherosclerotic disease aortic arch. Bovine branching pattern of the arch.  Proximal great vessels patent without stenosis. Right carotid system: Right carotid widely patent without stenosis. No significant atherosclerotic disease or dissection Left carotid system: Left carotid bifurcation occurs the proximal left common carotid artery just above the left clavicle. Left internal and external carotid arteries are widely patent without significant stenosis. Left internal carotid artery is relatively small diffusely due to hypoplastic left A1 segment. Vertebral arteries: Both vertebral arteries patent to the basilar without stenosis. Skeleton: Cervical spondylosis at multiple levels. No acute skeletal abnormality. Other  neck: Negative for mass or adenopathy in the neck. Upper chest: Lung apices clear bilaterally. Review of the MIP images confirms the above findings CTA HEAD FINDINGS Anterior circulation: Cavernous carotid patent bilaterally without stenosis or significant atherosclerotic disease. Anterior and middle cerebral arteries patent bilaterally without stenosis or large vessel occlusion. Hypoplastic left A1 segment. Both anterior cerebral arteries supplied from the right. Posterior circulation: Both vertebral arteries patent to the basilar. PICA not visualized. Basilar patent. Moderate stenosis in the posterior cerebral artery bilaterally. Superior cerebellar arteries patent. AICA patent bilaterally. Venous sinuses: Limited venous contrast due to arterial phase scanning. Anatomic variants: None Review of the MIP images confirms the above findings CT Brain Perfusion Findings: ASPECTS: 10 CBF (<30%) Volume: 78mL Perfusion (Tmax>6.0s) volume: 575mL Mismatch Volume: 525mL Infarction Location:CT perfusion demonstrates delayed perfusion throughout the cerebral cortex bilaterally involving the frontal parietal and temporal lobes. This is symmetric and most likely due to misregistration. IMPRESSION: 1. No significant carotid or vertebral artery stenosis in the neck. Note is made of an early bifurcation of left common carotid artery at the level the clavicle. 2. Negative for intracranial large vessel occlusion. 3. 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 4. CT perfusion shows diffuse cortical perfusion delay bilaterally felt to be artifact from misregistration. Electronically Signed   By: Franchot Gallo M.D.   On: 05/24/2020 11:23    Procedures Procedures (including critical care time)  Medications Ordered in ED Medications  sodium chloride flush (NS) 0.9 % injection 3 mL (has no administration in time range)  iohexol (OMNIPAQUE) 350 MG/ML injection 100 mL (100 mLs Intravenous Contrast Given 05/24/20  1112)    ED Course  I have reviewed the triage vital signs and the nursing notes.  Pertinent labs & imaging results that were available during my care of the patient were reviewed by me and considered in my medical decision making (see chart for details).    MDM Rules/Calculators/A&P                         patient presented to the emergency department as a code stroke, found on the ground. He has right sided defecits on exam.  He was evaluated by Neurology on ED arrival.  Attempted to place [facility] line under US guidance but unable to cannulate a vein.  Pt had CTA via IV in wrist.  CTA concerning for ICA occlusion.  IR not currently available at this facility.  Neurology arranged transfer to [facility] for emergent intervention.    Final Clinical Impression(s) / ED Diagnoses Final diagnoses:  Acute ischemic stroke [facility])    Rx / [place] Orders ED Discharge Orders    None       Quintella Reichert, MD 05/24/20 1652

## 2020-05-24 NOTE — Code Documentation (Signed)
Decision made for patient to be taken to Interventional Radiology at 1122. Patient on the way to IR when the Stroke Team was made aware that a patient was on the table and they would not be able to proceed with case.   [employer] called at [employer]. Spoke with dispatcher and reported they could not get a hold of Neuro Interventionalist at that time. Reported he would leave a message and call back. MD Cheral Marker called MD [facility] Interventionalist) directly and he connected MD Cheral Marker with accepting physician MD Owens Shark at [place] [facility].   Mikel Cella called back at 1152 to confirm transfer organized with facility. Facility confirmed ED to ED transfer at 1157 with MD [facility] as accepting provider.   [facility] arrived at 1203. NIHSS performed upon arrival. NIHSS 10. Patient to be taken to [facility]. Handoff given at bedside.

## 2020-05-24 NOTE — ED Triage Notes (Signed)
Pt here as a code stroke after being found in the floor by girlfriend today , pt lsn yesterday 1600, pt presents to the ed with right sided deficits

## 2020-05-24 NOTE — Consult Note (Addendum)
Referring Physician:     Chief Complaint: Acute onset right hemiplegia  HPI: Douglas Lowery is an 79 y.o. male presenting via EMS after he was found on the floor at home with right hemiplegia and dysarthria. He has no prior history of stroke or MI. He takes ASA. He is not on a blood thinner. LKN was 1600.   On EMS arrival, he was densely weak on the right. BP was 188/100, HR 88, CBG 104, 96% on RA.   LSN: 1600 tPA Given: No: Out of time window  Past Medical History:  Diagnosis Date  . Allergic rhinitis    seasonal  . BPH (benign prostatic hyperplasia)   . Carotid bruit    Carotid US 5/18:  1-39% RICA stenosis; Prominent R subclavian artery, measuring 1.4 cm AP x 1.5 cm TRV. - FU prn  . Cervicalgia 02/28/2016  . Chronic kidney disease   . Hypercholesteremia   . Hypertension     Past Surgical History:  Procedure Laterality Date  . COLONOSCOPY  02/2009   Sessile polyp in the rectum. Otherwise normal examination  . EYE SURGERY  2013   Right eye cataract removed--Dr. Katy Fitch    Family History  Problem Relation Age of Onset  . Pneumonia Father   . Alzheimer's disease Sister   . Hypertension Sister   . Hypertension Sister   . Hypertension Sister   . Heart attack Neg Hx    Social History:  reports that he has never smoked. He has never used smokeless tobacco. He reports current alcohol use. He reports that he does not use drugs.  Allergies:  Allergies  Allergen Reactions  . Lisinopril Cough    Medications:  No current facility-administered medications on file prior to encounter.   Current Outpatient Medications on File Prior to Encounter  Medication Sig Dispense Refill  . amLODipine (NORVASC) 10 MG tablet Take 1 tablet (10 mg total) by mouth daily. 90 tablet 3  . aspirin 81 MG chewable tablet Chew 81 mg by mouth daily.    . fenofibrate 54 MG tablet TAKE ONE TABLET BY MOUTH ONCE DAILY PATIENT NEEDS TO MAKE APPOINTMENT 15 tablet 0  . hydrALAZINE (APRESOLINE) 10 MG tablet  Take 10 mg by mouth daily.     . metoprolol (TOPROL-XL) 200 MG 24 hr tablet TAKE 1 TABLET BY MOUTH EVERY DAY 30 tablet 2  . pravastatin (PRAVACHOL) 80 MG tablet Take 1 tablet (80 mg total) by mouth daily. 30 tablet 11    ROS: As per HPI. No additional complaints. Detailed ROS deferred in the context of acuity of presentation.   Physical Examination: There were no vitals taken for this visit.  HEENT: [place]/AT Lungs: Respirations unlabored Ext: No edema  Neurologic Examination: Mental Status: Awake and alert but with hypophonic speech. Significant dysarthria, but no errors of grammar or syntax (fluency intact). Naming and repetition are intact. Comprehension intact. Oriented to month, year, city and state.  Cranial Nerves: II:  Visual fields intact in all 4 quadrants of right eye. 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 Right pupil 2 mm and reactive. Left pupil 3 mm and reactive.  III,IV, VI: No ptosis. No nystagmus. Right eye with intact horizontal visual pursuits. 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   V,VII: Temp sensation equal bilaterally. Right facial droop. VIII: Hearing intact to voice IX,X: Unable to fully open mouth for visualization of palate XI: Able to turn head to left and right without asymmetry XII:  Rightward tongue deviation Motor: RUE 1/5 RLE 1/5 LUE and LLE 5/5 Sensory: Decreased FT sensation to RUE and RLE. No extinction to DSS.  Deep Tendon Reflexes:  2+ bilateral patellae. 2+ left brachioradialis, 1+ right brachioradialis Cerebellar: No ataxia with FNF bilaterally  Gait: Unable to assess   Results for orders placed or performed during the hospital encounter of 05/24/20 (from the past 48 hour(s))  CBC     Status: Abnormal   Collection Time: 05/24/20 10:23 AM  Result Value Ref Range   WBC 7.7 4.0 - 10.5 K/uL   RBC 5.57 4.22 - 5.81 MIL/uL   Hemoglobin 17.4 (H) 13.0 - 17.0 g/dL   HCT 54.3 (H) 39 - 52 %   MCV 97.5 80.0 - 100.0  fL   MCH 31.2 26.0 - 34.0 pg   MCHC 32.0 30.0 - 36.0 g/dL   RDW 12.5 11.5 - 15.5 %   Platelets 272 150 - 400 K/uL   nRBC 0.0 0.0 - 0.2 %    Comment: Performed at [facility], [place] [address], [place], [place] [address]  Differential     Status: None   Collection Time: 05/24/20 10:23 AM  Result Value Ref Range   Neutrophils Relative % 83 %   Neutro Abs 6.3 1.7 - 7.7 K/uL   Lymphocytes Relative 11 %   Lymphs Abs 0.9 0.7 - 4.0 K/uL   Monocytes Relative 5 %   Monocytes Absolute 0.4 0 - 1 K/uL   Eosinophils Relative 0 %   Eosinophils Absolute 0.0 0 - 0 K/uL   Basophils Relative 1 %   Basophils Absolute 0.0 0 - 0 K/uL   Immature Granulocytes 0 %   Abs Immature Granulocytes 0.03 0.00 - 0.07 K/uL    Comment: Performed at [facility] [address], [place], [place] [address]  CBG monitoring, ED     Status: Abnormal   Collection Time: 05/24/20 10:25 AM  Result Value Ref Range   Glucose-Capillary 170 (H) 70 - 99 mg/dL    Comment: Glucose reference range applies only to samples taken after fasting for at least 8 hours.   Comment 1 Notify RN    Comment 2 Document in Chart   I-stat chem 8, ED     Status: Abnormal   Collection Time: 05/24/20 10:30 AM  Result Value Ref Range   Sodium 138 135 - 145 mmol/L   Potassium 5.6 (H) 3.5 - 5.1 mmol/L   Chloride 105 98 - 111 mmol/L   BUN 19 8 - 23 mg/dL   Creatinine, Ser 1.00 0.61 - 1.24 mg/dL   Glucose, Bld 167 (H) 70 - 99 mg/dL    Comment: Glucose reference range applies only to samples taken after fasting for at least 8 hours.   Calcium, Ion 1.02 (L) 1.15 - 1.40 mmol/L   TCO2 26 22 - 32 mmol/L   Hemoglobin 18.4 (H) 13.0 - 17.0 g/dL   HCT 54.0 (H) 39 - 52 %   No results found.  Assessment: 79 y.o. male presenting with acute onset of right hemiplegia, facial droop and dysarthria. 1. CT head with no acute abnormality. Extensive chronic ischemic changes and a chronic right pontine infarction noted. 2. Preliminary review of CTA by  both myself and Dr. 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  3.  Overall clinical presentation  most consistent with a large left MCA territory acute ischemic infarction. 4. Stroke Risk Factors - Carotid artery disease, hypercholesterolemia and HTN  Plan: 1. Not a tPA candidate due to time criteria 2. The patient was determined to be an endovascular candidate. Due to endovascular suite being in use for another procedure, with unknown time frame regarding when it would be free, [facility] interventional team was contacted and they agreed to transport of the patient to their center for possible intervetntion 3. Based upon the scan interpretation above, benefits of procedure were felt to outweigh risks. This was discussed with the patient who gave informed consent for procedure. 4. Patient transported to [facility] emergently via [facility] ambulance  Addendum: After patient was en route to [facility]. CT head was reviewed with Radiology, who had noted that there was no LVO on their official report. This was confirmed by reviewing the images together and discussing by telephone. The initial interpretation of left CCA and ICA occlusion was due to atypical anatomy, with left common jugular vein in close proximity to left CCA, the jugular exhibiting incomplete opacification by contrast with appearance resembling an occlusion; left common carotid artery with an early bifurcation into the ICA and ECA, the ECA being atypically large in caliber and significantly larger than the adjacent ICA, which was unusually small in caliber. The possible clot at the distal end of the left carotid siphon that was seen by myself and Dr. Estanislado Pandy was revealed to be a slight kink in the artery on the coronal post contrast images. I updated the [facility]  interventionalist, Dr. Bryon Lions with the above information immediately after discussion with Radiology.  @Electronically  signed: Dr. Kerney Elbe  05/24/2020, 10:38 AM

## 2020-05-25 LAB — CBG MONITORING, ED: Glucose-Capillary: 140 mg/dL — ABNORMAL HIGH (ref 70–99)

## 2020-05-30 DIAGNOSIS — R4701 Aphasia: Secondary | ICD-10-CM | POA: Diagnosis not present

## 2020-05-30 DIAGNOSIS — I6350 Cerebral infarction due to unspecified occlusion or stenosis of unspecified cerebral artery: Secondary | ICD-10-CM | POA: Diagnosis not present

## 2020-05-30 DIAGNOSIS — Z743 Need for continuous supervision: Secondary | ICD-10-CM | POA: Diagnosis not present

## 2020-05-30 DIAGNOSIS — R000 Tachycardia, unspecified: Secondary | ICD-10-CM | POA: Diagnosis not present

## 2020-05-30 DIAGNOSIS — I469 Cardiac arrest, cause unspecified: Secondary | ICD-10-CM | POA: Diagnosis not present

## 2020-05-30 DIAGNOSIS — I69391 Dysphagia following cerebral infarction: Secondary | ICD-10-CM | POA: Diagnosis not present

## 2020-05-30 DIAGNOSIS — N319 Neuromuscular dysfunction of bladder, unspecified: Secondary | ICD-10-CM | POA: Diagnosis not present

## 2020-05-30 DIAGNOSIS — M21371 Foot drop, right foot: Secondary | ICD-10-CM | POA: Diagnosis not present

## 2020-05-30 DIAGNOSIS — I69319 Unspecified symptoms and signs involving cognitive functions following cerebral infarction: Secondary | ICD-10-CM | POA: Diagnosis not present

## 2020-05-30 DIAGNOSIS — N3000 Acute cystitis without hematuria: Secondary | ICD-10-CM | POA: Diagnosis not present

## 2020-05-30 DIAGNOSIS — E559 Vitamin D deficiency, unspecified: Secondary | ICD-10-CM | POA: Diagnosis not present

## 2020-05-30 DIAGNOSIS — I69322 Dysarthria following cerebral infarction: Secondary | ICD-10-CM | POA: Diagnosis not present

## 2020-05-30 DIAGNOSIS — K117 Disturbances of salivary secretion: Secondary | ICD-10-CM | POA: Diagnosis not present

## 2020-05-30 DIAGNOSIS — I69320 Aphasia following cerebral infarction: Secondary | ICD-10-CM | POA: Diagnosis not present

## 2020-05-30 DIAGNOSIS — I499 Cardiac arrhythmia, unspecified: Secondary | ICD-10-CM | POA: Diagnosis not present

## 2020-05-30 DIAGNOSIS — I639 Cerebral infarction, unspecified: Secondary | ICD-10-CM | POA: Diagnosis not present

## 2020-05-30 DIAGNOSIS — I69351 Hemiplegia and hemiparesis following cerebral infarction affecting right dominant side: Secondary | ICD-10-CM | POA: Diagnosis not present

## 2020-05-30 DIAGNOSIS — Z466 Encounter for fitting and adjustment of urinary device: Secondary | ICD-10-CM | POA: Diagnosis not present

## 2020-05-30 DIAGNOSIS — B370 Candidal stomatitis: Secondary | ICD-10-CM | POA: Diagnosis not present

## 2020-05-30 DIAGNOSIS — K5900 Constipation, unspecified: Secondary | ICD-10-CM | POA: Diagnosis not present

## 2020-05-30 DIAGNOSIS — E538 Deficiency of other specified B group vitamins: Secondary | ICD-10-CM | POA: Diagnosis not present

## 2020-05-30 DIAGNOSIS — Z978 Presence of other specified devices: Secondary | ICD-10-CM | POA: Diagnosis not present

## 2020-05-30 DIAGNOSIS — N183 Chronic kidney disease, stage 3 unspecified: Secondary | ICD-10-CM | POA: Diagnosis not present

## 2020-05-30 DIAGNOSIS — R1310 Dysphagia, unspecified: Secondary | ICD-10-CM | POA: Diagnosis not present

## 2020-05-30 DIAGNOSIS — I69398 Other sequelae of cerebral infarction: Secondary | ICD-10-CM | POA: Diagnosis not present

## 2020-05-30 DIAGNOSIS — N182 Chronic kidney disease, stage 2 (mild): Secondary | ICD-10-CM | POA: Diagnosis not present

## 2020-05-30 DIAGNOSIS — R05 Cough: Secondary | ICD-10-CM | POA: Diagnosis not present

## 2020-05-30 DIAGNOSIS — G8101 Flaccid hemiplegia affecting right dominant side: Secondary | ICD-10-CM | POA: Diagnosis not present

## 2020-05-30 DIAGNOSIS — R509 Fever, unspecified: Secondary | ICD-10-CM | POA: Diagnosis not present

## 2020-05-30 DIAGNOSIS — Z7409 Other reduced mobility: Secondary | ICD-10-CM | POA: Diagnosis not present

## 2020-05-30 DIAGNOSIS — R471 Dysarthria and anarthria: Secondary | ICD-10-CM | POA: Diagnosis not present

## 2020-05-30 DIAGNOSIS — K592 Neurogenic bowel, not elsewhere classified: Secondary | ICD-10-CM | POA: Diagnosis not present

## 2020-05-30 DIAGNOSIS — R4020 Unspecified coma: Secondary | ICD-10-CM | POA: Diagnosis not present

## 2020-05-30 DIAGNOSIS — R404 Transient alteration of awareness: Secondary | ICD-10-CM | POA: Diagnosis not present

## 2020-05-30 DIAGNOSIS — I10 Essential (primary) hypertension: Secondary | ICD-10-CM | POA: Diagnosis not present

## 2020-05-30 DIAGNOSIS — R29810 Facial weakness: Secondary | ICD-10-CM | POA: Diagnosis not present

## 2020-05-30 DIAGNOSIS — R339 Retention of urine, unspecified: Secondary | ICD-10-CM | POA: Diagnosis not present

## 2020-05-30 DIAGNOSIS — R1312 Dysphagia, oropharyngeal phase: Secondary | ICD-10-CM | POA: Diagnosis not present

## 2020-05-30 DIAGNOSIS — I129 Hypertensive chronic kidney disease with stage 1 through stage 4 chronic kidney disease, or unspecified chronic kidney disease: Secondary | ICD-10-CM | POA: Diagnosis not present

## 2020-05-31 DIAGNOSIS — I10 Essential (primary) hypertension: Secondary | ICD-10-CM | POA: Diagnosis not present

## 2020-05-31 DIAGNOSIS — Z978 Presence of other specified devices: Secondary | ICD-10-CM | POA: Diagnosis not present

## 2020-05-31 DIAGNOSIS — I639 Cerebral infarction, unspecified: Secondary | ICD-10-CM | POA: Diagnosis not present

## 2020-05-31 DIAGNOSIS — R339 Retention of urine, unspecified: Secondary | ICD-10-CM | POA: Diagnosis not present

## 2020-05-31 DIAGNOSIS — E538 Deficiency of other specified B group vitamins: Secondary | ICD-10-CM | POA: Diagnosis not present

## 2020-06-02 ENCOUNTER — Other Ambulatory Visit: Payer: Self-pay | Admitting: Internal Medicine
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
## 2020-06-03 DIAGNOSIS — I6350 Cerebral infarction due to unspecified occlusion or stenosis of unspecified cerebral artery: Secondary | ICD-10-CM | POA: Diagnosis not present

## 2020-06-03 DIAGNOSIS — R1310 Dysphagia, unspecified: Secondary | ICD-10-CM | POA: Diagnosis not present

## 2020-06-03 DIAGNOSIS — Z978 Presence of other specified devices: Secondary | ICD-10-CM | POA: Diagnosis not present

## 2020-06-03 DIAGNOSIS — I10 Essential (primary) hypertension: Secondary | ICD-10-CM | POA: Diagnosis not present

## 2020-06-03 DIAGNOSIS — I639 Cerebral infarction, unspecified: Secondary | ICD-10-CM | POA: Diagnosis not present

## 2020-06-12 DIAGNOSIS — I639 Cerebral infarction, unspecified: Secondary | ICD-10-CM | POA: Diagnosis not present

## 2020-06-12 DIAGNOSIS — R29810 Facial weakness: Secondary | ICD-10-CM | POA: Diagnosis not present

## 2020-06-12 DIAGNOSIS — K117 Disturbances of salivary secretion: Secondary | ICD-10-CM | POA: Diagnosis not present

## 2020-06-14 DIAGNOSIS — R05 Cough: Secondary | ICD-10-CM | POA: Diagnosis not present

## 2020-06-14 DIAGNOSIS — R1310 Dysphagia, unspecified: Secondary | ICD-10-CM | POA: Diagnosis not present

## 2020-06-26 ENCOUNTER — Ambulatory Visit: Payer: Medicare Other | Admitting: Internal Medicine

## 2020-06-30 DEATH — deceased

## 2020-07-13 NOTE — Telephone Encounter (Signed)
I called Douglas Lowery home and cell phone & it is saying the number isnt available so I called his sister Douglas Lowery to see if she had his current phone number. But she did not answer either

## 2020-07-13 NOTE — Telephone Encounter (Signed)
I have called the patient and the phone is disconnected and I do not have another number to reach the patient

## 2020-10-03 ENCOUNTER — Encounter: Payer: Medicare Other | Admitting: Nurse Practitioner

## 2020-10-03 ENCOUNTER — Other Ambulatory Visit: Payer: Self-pay

## 2021-06-04 IMAGING — CT CT HEAD CODE STROKE
4 series · 16 of 47 positions shown, 18 images · non-contrast
Comparison: CT head 02/08/2019

CLINICAL DATA: Code stroke. Acute neuro deficit. Right-sided
deficit. Slurred speech and facial droop

EXAM:
CT HEAD WITHOUT CONTRAST
TECHNIQUE: Contiguous axial images were obtained from the base of the skull
through the vertex without intravenous contrast.

[Series 3: head wo · axial · 0.43mm/px · z∈[-181,-41]mm · 7 of 38 slices shown, 9 images]
[im 5/38  brain]
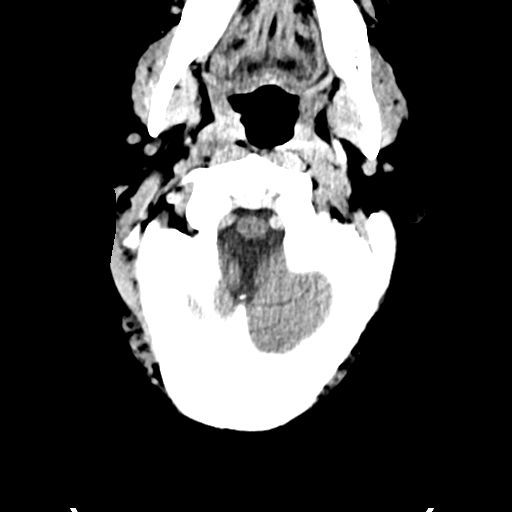
[im 5/38  bone]
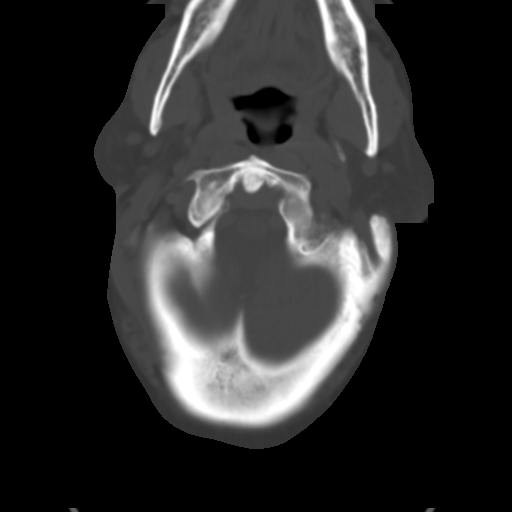
[im 10/38  brain]
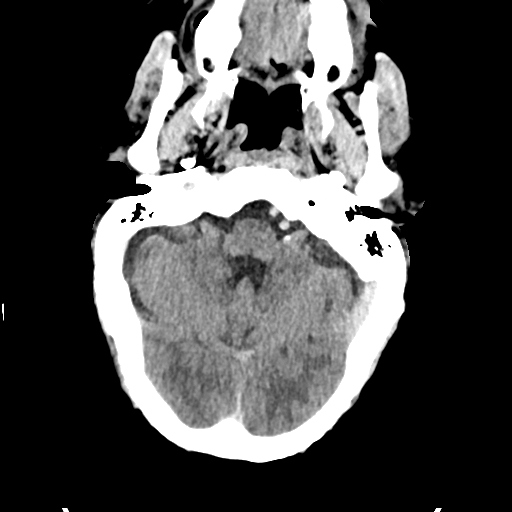
[im 14/38  brain]
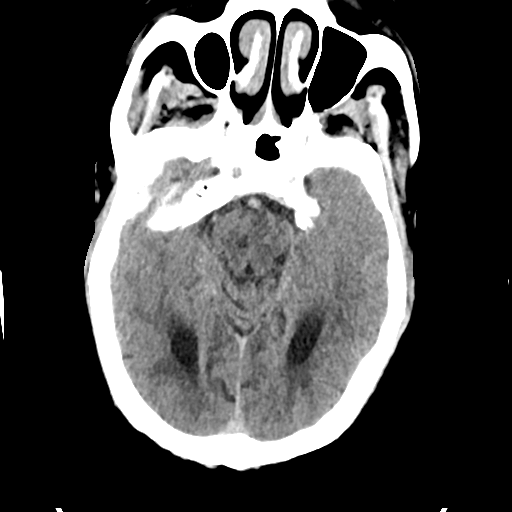
[im 19/38  brain]
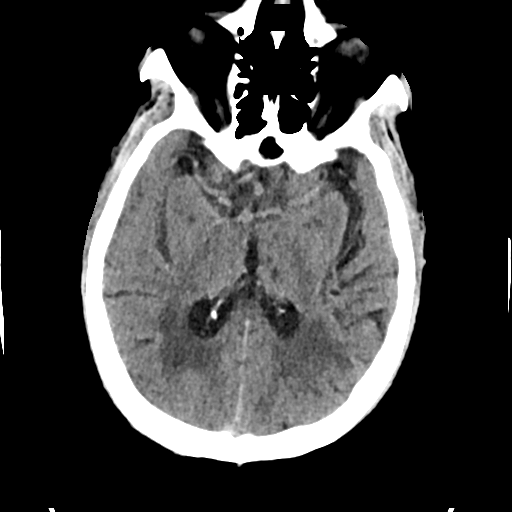
[im 24/38  brain]
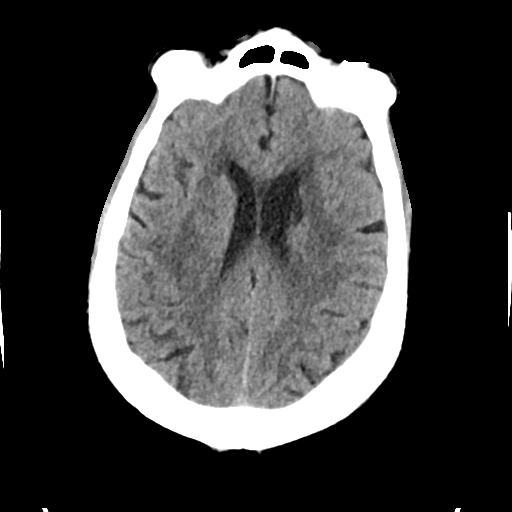
[im 24/38  bone]
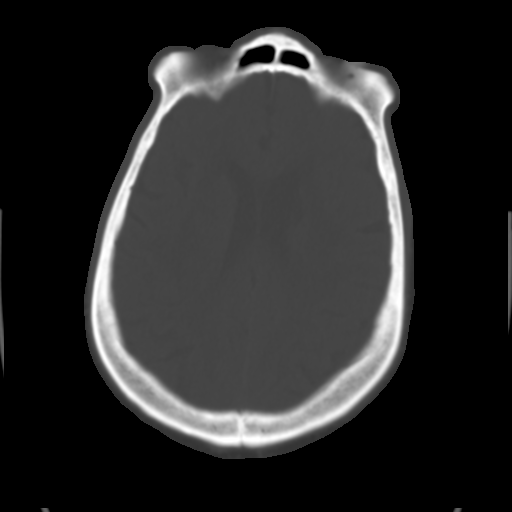
[im 28/38  brain]
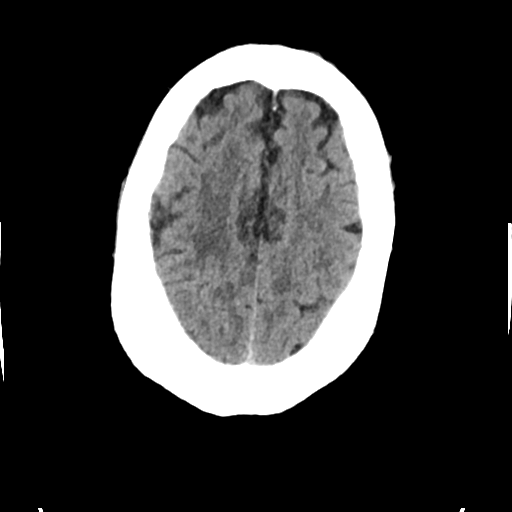
[im 33/38  brain]
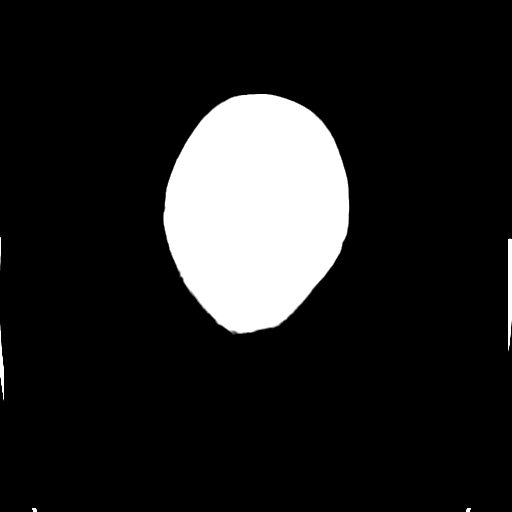

[Series 4: head bone · axial · 0.43mm/px · z∈[-183,-147]mm · 3 of 93 slices shown]
[im 10/93  bone]
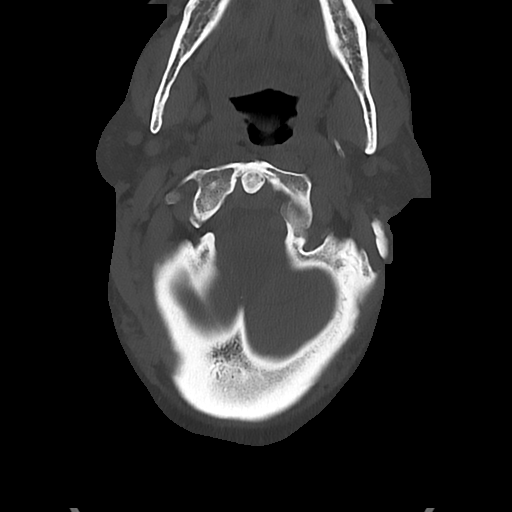
[im 19/93  bone]
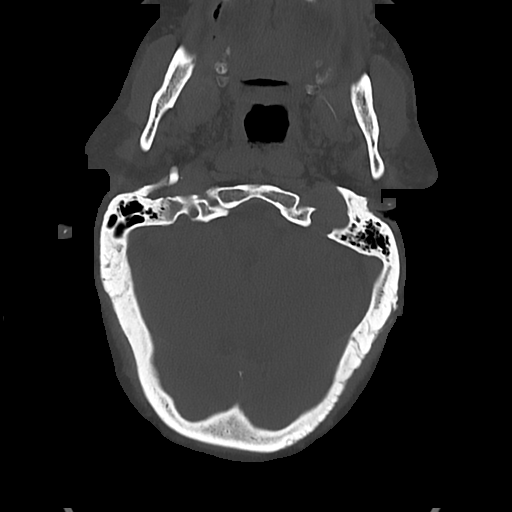
[im 28/93  bone]
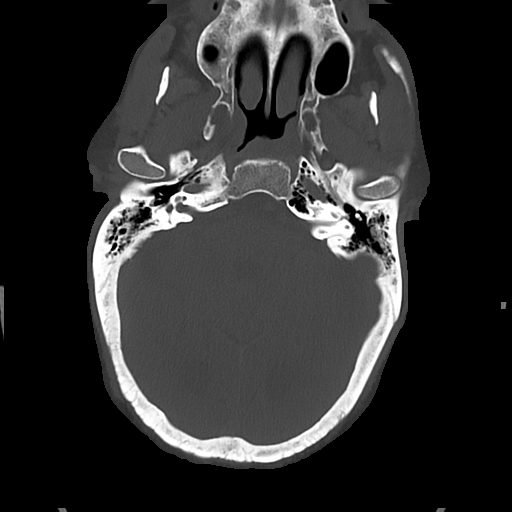

[Series 5: cor soft · coronal · 0.37mm/px · 3 of 79 slices shown]
[im 31/79  brain]
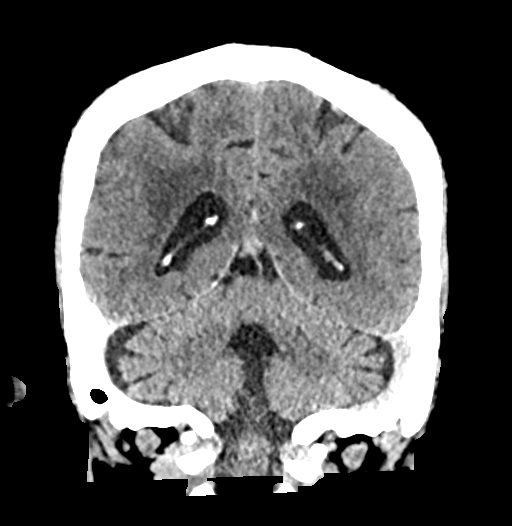
[im 37/79  brain]
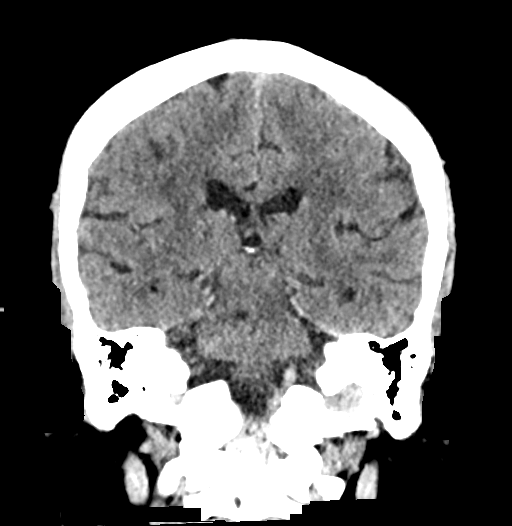
[im 43/79  brain]
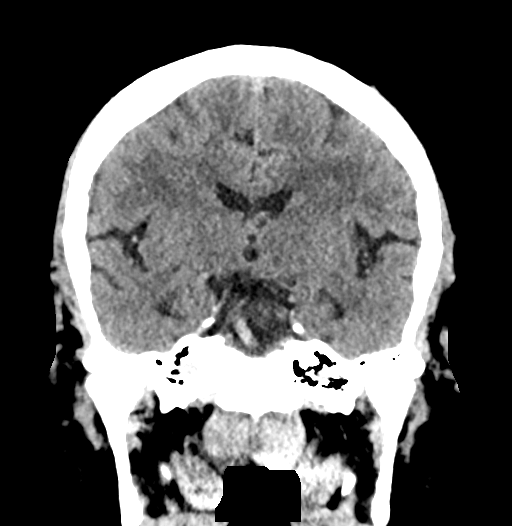

[Series 6: sag soft · sagittal · 0.36mm/px · 3 of 63 slices shown]
[im 21/63  brain]
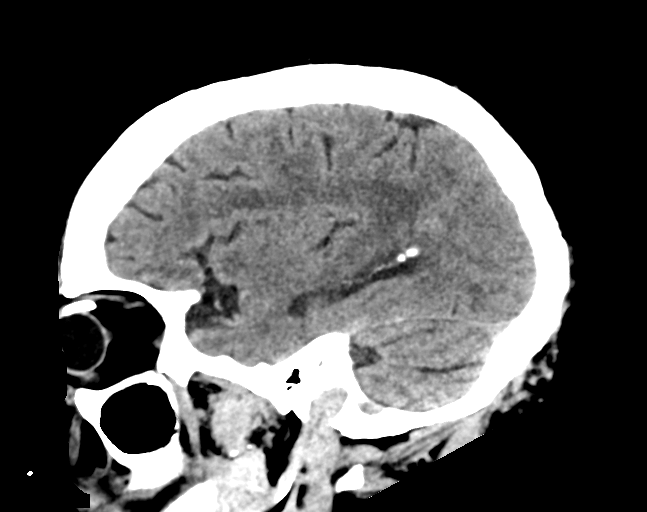
[im 32/63  brain]
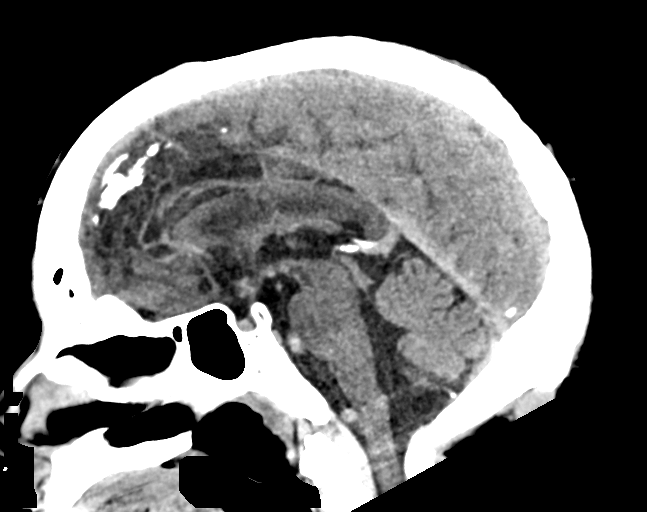
[im 42/63  brain]
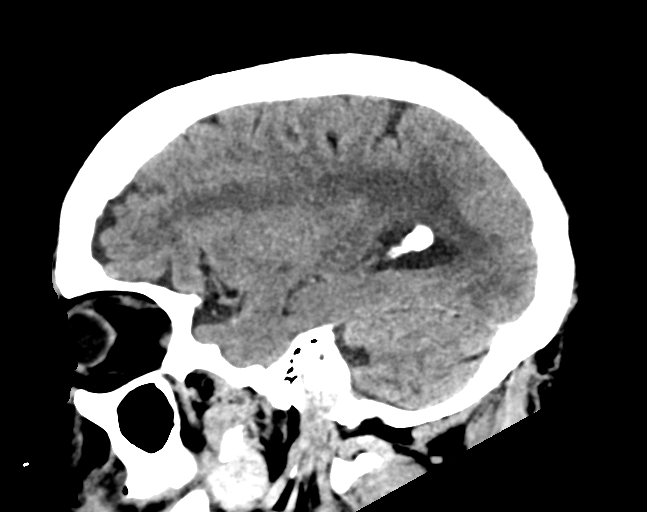

[16 of 47 positions shown; findings below may reference images not displayed]

FINDINGS: Brain: Chronic infarct left basal ganglia unchanged. Moderate to
extensive chronic microvascular ischemic changes in the white matter
also unchanged. Chronic infarct left thalamus unchanged. Hypodensity
right pons not definitely seen previously but appears chronic.

Hypodensity left medulla. Based on the sagittal images, this is
likely an area of artifact from beam hardening. Acute infarct less
likely.

Negative for hemorrhage or mass.  Negative for hydrocephalus.

Vascular: Arteries at the base of brain are diffusely high density.
No evidence of acute thrombosis.

Skull: Negative

Sinuses/Orbits: Paranasal sinuses clear. Left ocular valve. No
orbital mass. Bilateral cataract extraction.

Other: None

ASPECTS (Alberta Stroke Program Early CT Score)

- Ganglionic level infarction (caudate, lentiform nuclei, internal
capsule, insula, M1-M3 cortex): 7

- Supraganglionic infarction (M4-M6 cortex): 3

Total score (0-10 with 10 being normal): 10
IMPRESSION: 1. Extensive chronic ischemic changes. Right pontine infarct appears
chronic but not seen previously. No intracranial hemorrhage
2. Hypodensity left medulla most likely beam hardening artifact.
Correlate with symptoms.
3. ASPECTS is 10
4. Preliminary report texted to Dr. Arne Fredrik
